# Patient Record
Sex: Female | Born: 1960
Health system: Southern US, Community
[De-identification: ages and names within clinical notes are randomized; demographics above are authoritative.]

## PROBLEM LIST (undated history)

## (undated) DIAGNOSIS — J039 Acute tonsillitis, unspecified: Secondary | ICD-10-CM

## (undated) DIAGNOSIS — I10 Essential (primary) hypertension: Secondary | ICD-10-CM

## (undated) DIAGNOSIS — J329 Chronic sinusitis, unspecified: Secondary | ICD-10-CM

## (undated) DIAGNOSIS — M79606 Pain in leg, unspecified: Secondary | ICD-10-CM

## (undated) DIAGNOSIS — J45909 Unspecified asthma, uncomplicated: Secondary | ICD-10-CM

## (undated) DIAGNOSIS — E041 Nontoxic single thyroid nodule: Secondary | ICD-10-CM

## (undated) HISTORY — PX: ENDOSCOPIC VEIN LASER TREATMENT: SHX1508

## (undated) HISTORY — PX: SHOULDER OPEN ROTATOR CUFF REPAIR: SHX2407

## (undated) HISTORY — DX: Pain in leg, unspecified: M79.606

## (undated) HISTORY — DX: Acute tonsillitis, unspecified: J03.90

## (undated) HISTORY — PX: ABDOMINAL HYSTERECTOMY: SHX81

## (undated) HISTORY — DX: Chronic sinusitis, unspecified: J32.9

## (undated) HISTORY — PX: TONSILLECTOMY: SUR1361

## (undated) HISTORY — DX: Essential (primary) hypertension: I10

---

## 1997-12-17 HISTORY — PX: ANAL FISSURECTOMY: SUR608

## 2000-11-23 ENCOUNTER — Emergency Department (HOSPITAL_COMMUNITY): Admission: EM | Admit: 2000-11-23 | Discharge: 2000-11-23 | Payer: Self-pay | Admitting: *Deleted

## 2006-11-25 ENCOUNTER — Observation Stay (HOSPITAL_COMMUNITY): Admission: EM | Admit: 2006-11-25 | Discharge: 2006-11-25 | Payer: Self-pay | Admitting: Emergency Medicine

## 2008-10-05 ENCOUNTER — Encounter: Admission: RE | Admit: 2008-10-05 | Discharge: 2008-10-05 | Payer: Self-pay | Admitting: Obstetrics and Gynecology

## 2009-01-08 ENCOUNTER — Emergency Department (HOSPITAL_COMMUNITY): Admission: EM | Admit: 2009-01-08 | Discharge: 2009-01-08 | Payer: Self-pay | Admitting: Emergency Medicine

## 2009-05-28 ENCOUNTER — Emergency Department (HOSPITAL_COMMUNITY): Admission: EM | Admit: 2009-05-28 | Discharge: 2009-05-28 | Payer: Self-pay | Admitting: Emergency Medicine

## 2009-12-22 ENCOUNTER — Encounter: Admission: RE | Admit: 2009-12-22 | Discharge: 2009-12-22 | Payer: Self-pay | Admitting: Family Medicine

## 2010-01-24 ENCOUNTER — Ambulatory Visit: Payer: Self-pay | Admitting: Diagnostic Radiology

## 2010-01-24 ENCOUNTER — Emergency Department (HOSPITAL_BASED_OUTPATIENT_CLINIC_OR_DEPARTMENT_OTHER): Admission: EM | Admit: 2010-01-24 | Discharge: 2010-01-24 | Payer: Self-pay | Admitting: Emergency Medicine

## 2011-03-08 LAB — COMPREHENSIVE METABOLIC PANEL
ALT: 29 U/L (ref 0–35)
AST: 29 U/L (ref 0–37)
Albumin: 4 g/dL (ref 3.5–5.2)
Alkaline Phosphatase: 62 U/L (ref 39–117)
BUN: 16 mg/dL (ref 6–23)
CO2: 32 mEq/L (ref 19–32)
Calcium: 8.8 mg/dL (ref 8.4–10.5)
Chloride: 102 mEq/L (ref 96–112)
Creatinine, Ser: 0.7 mg/dL (ref 0.4–1.2)
GFR calc Af Amer: >60 mL/min (ref >60)
GFR calc non Af Amer: >60 mL/min (ref >60)
Glucose, Bld: 99 mg/dL (ref 70–99)
Potassium: 3.5 mEq/L (ref 3.5–5.1)
Sodium: 141 mEq/L (ref 135–145)
Total Bilirubin: 0.9 mg/dL (ref 0.3–1.2)
Total Protein: 7.2 g/dL (ref 6.0–8.3)

## 2011-03-08 LAB — POCT CARDIAC MARKERS
CKMB, poc: 1.4 ng/mL (ref 1.0–8.0)
CKMB, poc: 2.6 ng/mL (ref 1.0–8.0)
Myoglobin, poc: 38.9 ng/mL (ref 12–200)
Myoglobin, poc: 43.4 ng/mL (ref 12–200)
Troponin i, poc: <0.05 ng/mL (ref 0.00–0.09)
Troponin i, poc: <0.05 ng/mL (ref 0.00–0.09)

## 2011-03-08 LAB — DIFFERENTIAL
Basophils Absolute: 0 10*3/uL (ref 0.0–0.1)
Basophils Relative: 0 % (ref 0–1)
Eosinophils Absolute: 0.4 10*3/uL (ref 0.0–0.7)
Eosinophils Relative: 4 % (ref 0–5)
Lymphocytes Relative: 25 % (ref 12–46)
Lymphs Abs: 2.5 10*3/uL (ref 0.7–4.0)
Monocytes Absolute: 0.6 10*3/uL (ref 0.1–1.0)
Monocytes Relative: 6 % (ref 3–12)
Neutro Abs: 6.2 10*3/uL (ref 1.7–7.7)
Neutrophils Relative %: 64 % (ref 43–77)

## 2011-03-08 LAB — D-DIMER, QUANTITATIVE: D-Dimer, Quant: 0.88 ug/mL-FEU — ABNORMAL HIGH (ref 0.00–0.48)

## 2011-03-08 LAB — CBC
HCT: 39.6 % (ref 36.0–46.0)
Hemoglobin: 14 g/dL (ref 12.0–15.0)
MCHC: 35.3 g/dL (ref 30.0–36.0)
MCV: 93.2 fL (ref 78.0–100.0)
Platelets: 245 10*3/uL (ref 150–400)
RBC: 4.25 MIL/uL (ref 3.87–5.11)
RDW: 11.5 % (ref 11.5–15.5)
WBC: 9.7 10*3/uL (ref 4.0–10.5)

## 2011-03-08 LAB — LIPASE, BLOOD: Lipase: 54 U/L (ref 23–300)

## 2011-05-04 NOTE — Consult Note (Signed)
[facility]  Patient:    Samantha Roberson, Samantha Roberson                         MRN: [id number] Proc. Date: 11/23/00 Adm. Date:  [id number] Attending:  Sandi Raveling                          Consultation Report  HISTORY OF PRESENT ILLNESS:  The patient is a 50 year old white married female who is status post total vaginal hysterectomy on November 06, 2000, done in [place], [place].  She travelled to  [place] to visit her husband who has moved here for his job recently, then began having extensive vaginal bleeding at approximately 10:00 this morning and was transported to the [facility] Emergency Room by EMS.  The patient denies any vaginal entry but admits to having had sexual contact resulting in orgasm.  She has had no other complications from her total vaginal hysterectomy.  Her other gynecologic surgical procedures include an ovarian cystectomy done laparoscopically in 1999 and then in 2000 a minilaparotomy with oophorectomy.  From her history, her total vaginal hysterectomy went uneventfully, and she did not require vaginal packing immediately postoperatively.  She was discharged home on postoperative day one with anti-inflammatory drugs and Percocet.  She discontinued her Percocet approximately two days after getting home from the hospital and has had no further abdominal pain.  She specifically denies any dysuria, though she noticed the heavy bleeding initially when she went to urinate.  She denies any nausea, vomiting, or dizziness.  She is able to ambulate without difficulty.  PHYSICAL EXAMINATION:  VITAL SIGNS:  Blood pressure 112/51, pulse 78.  ABDOMEN:  Soft without masses, organomegaly, or tenderness.  PELVIC:  EG/BUS is within normal limits.  There is a small amount of bloody drainage on a perineal pad which has been in place for two hours.  Vaginal examination reveals a small amount of dark blood in the vault.  The vaginal cuff is  visualized after removal of that small amount of dark blood.  The sutures are noted to be intact.  The apex is well visualized with no active bleeding noted.  On bimanual examination, no masses or tenderness are appreciated.  Cervix and uterus are surgically absent.  LABORATORY STUDIES:  Hemoglobin 12.3, white blood cell count 9.6.  IMPRESSION:  Light postoperative hemorrhage status post total vaginal hysterectomy, now resolved.  DISPOSITION: 1. The patient is discharged to home in the care of her husband who lives here    in [place]. 2. She is given my on-call number to notify me if she has any bleeding heavier    than a light period.  If she has no further bleeding, she can return to    [place], as she has planned on November 25, 2000.  If she has any    further bleeding than is heavier than a light period, she will call me and    be reevaluated at that time.  The patient seems to understand and is    comfortable with these instructions. DD:  11/23/00 TD:  11/24/00 Job: [id number]

## 2011-05-04 NOTE — H&P (Signed)
NAME:  ZOIEE, WIMMER NO.:  [id number]   MEDICAL RECORD NO.:  [id number]          PATIENT TYPE:  EMS   LOCATION:  [facility]                         FACILITY:  [facility]   PHYSICIAN:  Jackie Plum, M.D.DATE OF BIRTH:  March 04, 1961   DATE OF ADMISSION:  11/25/2006  DATE OF DISCHARGE:                              HISTORY & PHYSICAL   CHIEF COMPLAINT:  Chest pain.   HISTORY OF PRESENT ILLNESS:  The patient is a 50 year old Caucasian lady  who presented with 1-day history of chest pain which was said to be a  sternal pressure-like pain radiating to both arms and jaw.  It had been  of mild to moderate intensity, but came down to mild intensity at time  of evaluation in the ED.  The pain is worsened by deep breathing.  She  has had some nausea with dizziness, especially on sitting up.  There has  not been any diaphoresis, syncopal episode or presyncope.  The patient  denies any PND, orthopnea, palpitations, fever or chills, cough or  sputum production.  No abdominal pain.  No constipation and no diarrhea.   PAST MEDICAL HISTORY:  Positive for history of anal fissure, status post  repair, status post partial hysterectomy and removal of ovariectomy due  to ovarian cyst.  She denies any previous history of hypertension,  diabetes or dyslipidemia.   FAMILY HISTORY:  Positive for heart disease and hypertension.   SOCIAL HISTORY:  The patient is married and lives with her husband, who  lives in [place].  She recently moved from [place] to be with her  husband about 3 months ago.  She works at __________ and does not smoke  cigarettes.  She drinks alcohol occasionally on a social basis.   MEDICATIONS:  She is not on any medications at the moment.   ALLERGIES:  Does not have any medication allergies.   REVIEW OF SYSTEMS:  As noted above, otherwise unremarkable.   PHYSICAL EXAMINATION:  VITAL SIGNS:  Blood pressure 117/72, pulse 72,  respirations 20, temperature 97.8  degrees Fahrenheit, O2 SAT 99%.  GENERAL:  She is not in any cardiopulmonary distress and is comfortable-  looking.  HEENT:  Normocephalic, atraumatic.  Pupils equal, round and reactive to  light.  Extraocular movements intact.  Oropharynx moist.  NECK:  Supple.  No JVD.  LUNGS:  Clear to auscultation.  CARDIAC:  Regular rate and rhythm.  No gallops or murmur.  ABDOMEN:  Soft and nontender.  Bowel sounds present.  EXTREMITIES:  No cyanosis.  CNS:  Exam was nonfocal.   IMAGING STUDY:  CT scan of the chest revealed no PE.  There was possible  thickening of the distal esophageal wall versus a collapsed hiatal  hernia.   X-ray of the chest showed medial right lower lobe infiltrate.   LABORATORY WORK:  Basic metabolic panel was within normal limits, except  for mild hypokalemia of 3.3.  CBC was within normal limits, except for  WBC of 11,100.  Point-of-care cardiac enzymes were within normal limits.   IMPRESSION:  Right lower lobe pneumonia.   The patient will  be admitted to a telemetry bed for observation status.  We will check serial cardiac enzymes.  The patient will benefit from  esophagogram for further evaluation and management of her abnormal CT  scan findings.  We will check serial cardiac enzymes and lipid panel.  Further recommendations will be made as the data base responds.  Hopefully, she can be discharged sometime tomorrow after ruling out.      Jackie Plum, M.D.  Electronically Signed     GO/MEDQ  D:  11/25/2006  T:  11/25/2006  Job:  [id number]

## 2012-02-21 ENCOUNTER — Other Ambulatory Visit: Payer: Self-pay

## 2012-02-21 DIAGNOSIS — I83893 Varicose veins of bilateral lower extremities with other complications: Secondary | ICD-10-CM

## 2012-02-21 DIAGNOSIS — M79609 Pain in unspecified limb: Secondary | ICD-10-CM

## 2012-02-28 ENCOUNTER — Other Ambulatory Visit: Payer: Self-pay | Admitting: Family Medicine

## 2012-02-28 DIAGNOSIS — Z1231 Encounter for screening mammogram for malignant neoplasm of breast: Secondary | ICD-10-CM

## 2012-03-13 ENCOUNTER — Ambulatory Visit
Admission: RE | Admit: 2012-03-13 | Discharge: 2012-03-13 | Disposition: A | Discharge status: Home / Self Care | Payer: 59 | Source: Ambulatory Visit | Attending: Family Medicine | Admitting: Family Medicine

## 2012-03-13 DIAGNOSIS — Z1231 Encounter for screening mammogram for malignant neoplasm of breast: Secondary | ICD-10-CM

## 2012-03-24 ENCOUNTER — Encounter (INDEPENDENT_AMBULATORY_CARE_PROVIDER_SITE_OTHER): Payer: 59 | Admitting: Obstetrics and Gynecology

## 2012-03-24 DIAGNOSIS — Z01419 Encounter for gynecological examination (general) (routine) without abnormal findings: Secondary | ICD-10-CM

## 2012-03-26 ENCOUNTER — Encounter: Payer: Self-pay | Admitting: Vascular Surgery

## 2012-04-10 ENCOUNTER — Encounter: Payer: Self-pay | Admitting: Vascular Surgery

## 2012-04-11 ENCOUNTER — Ambulatory Visit (INDEPENDENT_AMBULATORY_CARE_PROVIDER_SITE_OTHER): Payer: 59 | Admitting: Vascular Surgery

## 2012-04-11 ENCOUNTER — Encounter: Payer: Self-pay | Admitting: Vascular Surgery

## 2012-04-11 VITALS — BP 131/87 | HR 75 | Temp 98.1°F | Ht 69.0 in | Wt 200.0 lb

## 2012-04-11 DIAGNOSIS — I83893 Varicose veins of bilateral lower extremities with other complications: Secondary | ICD-10-CM | POA: Insufficient documentation

## 2012-04-11 DIAGNOSIS — M79609 Pain in unspecified limb: Secondary | ICD-10-CM

## 2012-04-11 NOTE — Progress Notes (Signed)
BLE venous reflux duplex performed @ [facility] 04/11/2012

## 2012-04-11 NOTE — Progress Notes (Signed)
[facility] [place]  Referred by:  Garth Schlatter, MD [facility] [address] [place], [place] [address]  Reason for referral: B swollen legs  History of Present Illness  Samantha Roberson is a 51 y.o. (1961-02-15) female who presents with chief complaint: swollen leg.  Patient notes, onset of swelling over one year ago, associated with standing.  The patient has had no history of DVT, known history of varicose vein, no history of venous stasis ulcers, no history of  Lymphedema and no history of skin changes in lower legs.  There is known family history of venous disorders.  The patient has used OTC compression stockings in the past.  She notes bilateral swelling (L>R) and pain and achiing in both lower legs by the end of the day.  The swelling resolves with extended reversed incline position.  Past Medical History  Diagnosis Date  . Leg pain   . Asthma   . Hypertension   . Sinusitis   . Tonsillitis     Past Surgical History  Procedure Date  . Abdominal hysterectomy   . Anal fissurectomy 1999    History   Social History  . Marital Status: Married    Spouse Name: N/A    Number of Children: N/A  . Years of Education: N/A   Occupational History  . Not on file.   Social History Main Topics  . Smoking status: Never Smoker   . Smokeless tobacco: Not on file  . Alcohol Use: Yes  . Drug Use: No  . Sexually Active:    Other Topics Concern  . Not on file   Social History Narrative  . No narrative on file    Family History  Problem Relation Age of Onset  . Stroke Mother   . Hypertension Mother   . Cancer Father   . Hypertension Father   . Hyperlipidemia Father   . Hypertension Sister     Current Outpatient Prescriptions on File Prior to Visit  Medication Sig Dispense Refill  . albuterol (PROAIR HFA) 108 (90 BASE) MCG/ACT inhaler Inhale 2 puffs into the lungs every 6 (six) hours as needed.      Marland Kitchen aspirin 81 MG  tablet Take 81 mg by mouth daily.      . hydrochlorothiazide (HYDRODIURIL) 25 MG tablet Take 25 mg by mouth daily. Take two (25 mg) tablets daily.      . metoprolol succinate (TOPROL-XL) 25 MG 24 hr tablet Take 25 mg by mouth daily.      . Multiple Vitamin (MULTIVITAMIN) capsule Take 1 capsule by mouth daily.        Allergies  Allergen Reactions  . Benadryl (Altaryl)     Review of Systems (Positive items checked otherwise negative)  General: [ ]  Weight loss, [ ]  Weight gain, [ ]   Loss of appetite, [ ]  Fever  Neurologic: [ ]  Dizziness, [ ]  Blackouts, [ ]  Headaches, [ ]  Seizure, [x]  weakness in arms and legs  Ear/Nose/Throat: [ ]  Change in eyesight, [ ]  Change in hearing, [ ]  Nose bleeds, [ ]  Sore throat  Vascular: [x]  Pain in legs with walking, [x]  Pain in feet while lying flat, [ ]  Non-healing ulcer, Stroke, [ ]  "Mini stroke", [ ]  Slurred speech, [ ]  Temporary blindness, [ ]  Blood clot in vein, [ ]  Phlebitis, [x]  leg swelling  Pulmonary: [ ]  Home oxygen, [ ]  Productive cough, [ ]  Bronchitis, [ ]  Coughing up blood, [ ]  Asthma, [ ]   Wheezing  Musculoskeletal: [ ]  Arthritis, [ ]  Joint pain, [ ]  Muscle pain  Cardiac: [ ]  Chest pain, [ ]  Chest tightness/pressure, [ ]  Shortness of breath when lying flat, [ ]  Shortness of breath with exertion, [ ]  Palpitations, [ ]  Heart murmur, [ ]  Arrythmia, [ ]  Atrial fibrillation  Hematologic: [ ]  Bleeding problems, [ ]  Clotting disorder, [ ]  Anemia  Psychiatric:  [ ]  Depression, [ ]  Anxiety, [ ]  Attention deficit disorder  Gastrointestinal:  [ ]  Black stool,[ ]   Blood in stool, [ ]  Peptic ulcer disease, [ ]  Reflux, [ ]  Hiatal hernia, [ ]  Trouble swallowing, [ ]  Diarrhea, [ ]  Constipation  Urinary:  [ ]  Kidney disease, [ ]  Burning with urination, [ ]  Frequent urination, [ ]  Difficulty urinating  Skin: [ ]  Ulcers, [ ]  Rashes  Physical Examination  Filed Vitals:   04/11/12 1438  BP: 131/87  Pulse: 75  Temp: 98.1 F (36.7 C)  TempSrc: Oral    Height: 5\' 9"  (1.753 m)  Weight: 200 lb (90.719 kg)   Body mass index is 29.53 kg/(m^2).  General: A&O x 3, WDWN, tall  Head: [place]/AT  Ear/Nose/Throat: Hearing grossly intact, nares w/o erythema or drainage, oropharynx w/o Erythema/Exudate  Eyes: PERRLA, EOMI  Neck: Supple, no nuchal rigidity, no palpable LAD  Pulmonary: Sym exp, good air movt, CTAB, no rales, rhonchi, & wheezing  Cardiac: RRR, Nl S1, S2, no Murmurs, rubs or gallops  Vascular: Vessel Right Left  Radial Palpable Palpable  Brachial Palpable Palpable  Carotid Palpable, without bruit Palpable, without bruit  Aorta Non-palpable N/A  Femoral Palpable Palpable  Popliteal Non-palpable Non-palpable  PT Palpable Palpable  DP Palpable Palpable   Gastrointestinal: soft, NTND, -G/R, - HSM, - masses, - CVAT B  Musculoskeletal: M/S 5/5 throughout , Extremities without ischemic changes , B lower leg varicosities (L>R), R medial varicosities enlarged  Neurologic: CN 2-12 intact , light touch intact in extremities , Motor exam as listed above  Psychiatric: Judgment intact, Mood & affect appropriate for pt's clinical situation  Dermatologic: See M/S exam for extremity exam, no rashes otherwise noted  Lymph : No Cervical, Axillary, or Inguinal lymphadenopathy   Non-Invasive Vascular Imaging  BLE Venous Insufficiency Duplex (Date: 04/11/12):   RLE: GSV reflux, no deep reflux  LLE: GSV reflux, CFV reflux, perforator reflux  Outside Studies/Documentation 3 pages of outside documents were reviewed including: outpatient clinic chart.  Medical Decision Making  Samantha Roberson is a 51 y.o. female who presents with: sx chronic venous insufficiency (C3).   Based on the patient's history and examination, I recommend: BLE thigh high 20-30 mm Hg compression stockings.  Re-evaluation in Vein Clinic in 3 months for possible EVLA of L GSV then possible R GSV at a later date.  Thank you for allowing [place] to participate in  this patient's care.  Leonides Sake, MD [facility] [place] Office: [phone] Pager: [phone]  04/11/2012, 3:08 PM

## 2012-04-16 NOTE — Procedures (Unsigned)
LOWER EXTREMITY VENOUS REFLUX EXAM  INDICATION:  Lower extremity varicose veins and pain  EXAM:  Using color-flow imaging and pulse Doppler spectral analysis, the bilateral common femoral, femoral, popliteal, posterior tibial, great and small saphenous veins were evaluated.  There is evidence suggesting deep venous insufficiency in the left lower extremity.  There is no evidence suggesting deep venous insufficiency in the right lower extremity.  The bilateral saphenofemoral junctions are not competent with reflux of >500 milliseconds.  The bilateral GSV's are not competent with reflux of >500 milliseconds with the calibers as described below.  The bilateral proximal small saphenous veins demonstrate competency.  GSV Diameter (used if found to be incompetent only)                                           Right    Left Proximal Greater Saphenous Vein           0.60 cm  0.73 cm Proximal-to-mid-thigh                     0.64 cm  0.44 cm Mid thigh                                 0.53 cm  0.31 cm Mid-distal thigh                          cm       cm Distal thigh                              0.49 cm  0.39 cm Knee                                      0.42 cm  0.40 cm  IMPRESSION: 1. The bilateral great saphenous veins are not competent with reflux     of >500 milliseconds. 2. The bilateral great saphenous veins are not tortuous. 3. The deep venous system of the right lower extremity is competent. 4. The deep venous system of the left lower extremity is not competent     with reflux of >500 milliseconds. 5. The bilateral small saphenous veins are competent. 6. There is an incompetent perforator involving the left mid distal     calf measuring 0.23 cm in diameter as noted on drawing.      ___________________________________________ Fransisco Hertz, MD  SH/MEDQ  D:  04/11/2012  T:  04/11/2012  Job:  [id number]

## 2012-07-14 ENCOUNTER — Ambulatory Visit: Payer: 59 | Admitting: Vascular Surgery

## 2012-09-08 ENCOUNTER — Encounter: Payer: Self-pay | Admitting: Vascular Surgery

## 2012-09-09 ENCOUNTER — Ambulatory Visit (INDEPENDENT_AMBULATORY_CARE_PROVIDER_SITE_OTHER): Payer: 59 | Admitting: Vascular Surgery

## 2012-09-09 ENCOUNTER — Encounter: Payer: Self-pay | Admitting: Vascular Surgery

## 2012-09-09 VITALS — BP 126/83 | HR 91 | Resp 18 | Ht 69.0 in | Wt 210.0 lb

## 2012-09-09 DIAGNOSIS — I83893 Varicose veins of bilateral lower extremities with other complications: Secondary | ICD-10-CM

## 2012-09-09 NOTE — Progress Notes (Signed)
Subjective:     Patient ID: Samantha Roberson, female   DOB: January 14, 1961, 51 y.o.   MRN: [id number]  HPI this 51 year old female returns for further evaluation of her severe venous insufficiency of both legs. She was seen by Dr. Imogene Burn in April of this year. She has been having swelling which has progressed over the last few years to a severe degree on the left and slightly lesser degree on the right. She has tried long-leg elastic compression stockings 20-30 mm gradient as well as elevation and ibuprofen with very little improvement. She has no history of stasis ulcers or bleeding or DVT or superficial thrombophlebitis. Her symptoms are affecting her daily living. She has documented gross reflux in both great saphenous veins on a formal duplex exam performed in April of this year.  Past Medical History  Diagnosis Date  . Leg pain   . Asthma   . Hypertension   . Sinusitis   . Tonsillitis     History  Substance Use Topics  . Smoking status: Never Smoker   . Smokeless tobacco: Never Used  . Alcohol Use: Yes    Family History  Problem Relation Age of Onset  . Stroke Mother   . Hypertension Mother   . Cancer Father   . Hypertension Father   . Hyperlipidemia Father   . Hypertension Sister     Allergies  Allergen Reactions  . Benadryl (Diphenhydramine Hcl)     Current outpatient prescriptions:albuterol (PROAIR HFA) 108 (90 BASE) MCG/ACT inhaler, Inhale 2 puffs into the lungs every 6 (six) hours as needed., Disp: , Rfl: ;  amoxicillin-clavulanate (AUGMENTIN) 875-125 MG per tablet, , Disp: , Rfl: ;  aspirin 81 MG tablet, Take 81 mg by mouth daily., Disp: , Rfl: ;  hydrochlorothiazide (HYDRODIURIL) 25 MG tablet, Take 25 mg by mouth daily. Take two (25 mg) tablets daily., Disp: , Rfl:  metoprolol succinate (TOPROL-XL) 25 MG 24 hr tablet, Take 25 mg by mouth daily., Disp: , Rfl: ;  Multiple Vitamin (MULTIVITAMIN) capsule, Take 1 capsule by mouth daily., Disp: , Rfl:   BP 126/83  Pulse 91   Resp 18  Ht 5\' 9"  (1.753 m)  Wt 210 lb (95.255 kg)  BMI 31.01 kg/m2  Body mass index is 31.01 kg/(m^2).        Review of Systems denies chest pain, dyspnea on exertion, PND, orthopnea, hemoptysis, claudication    Objective:   Physical Exam blood pressure 126/83 heart rate 91 respirations 18 General well-developed well-nourished female in no apparent stress alert and oriented x3 Lower extremities left leg with 3+ femoral and dorsalis pedis pulse palpable. There is 2+ distal edema from the knee to the foot. No bulging varicosities are noted. No active ulcerations are noted. Right lower extremity has 3+ femoral and dorsalis pedis pulse palpable. 1+ edema present distally with early hyperpigmentation.    Assessment:     Severe venous insufficiency bilaterally with chronic worsening edema and severe symptoms refractory to conservative measures such as long-leg elastic compression stockings, ibuprofen, elevation.  This is due to gross reflux bilateral great saphenous systems.    Plan:     Patient needs a laser ablation left great saphenous vein followed by laser ablation right great saphenous vein. This is to hopefully improve her symptomatology of severe edema and pain. Will proceed with precertification to perform this in near future

## 2012-09-30 ENCOUNTER — Other Ambulatory Visit: Payer: Self-pay | Admitting: *Deleted

## 2012-09-30 DIAGNOSIS — I83893 Varicose veins of bilateral lower extremities with other complications: Secondary | ICD-10-CM

## 2012-10-24 ENCOUNTER — Encounter: Payer: Self-pay | Admitting: Vascular Surgery

## 2012-10-27 ENCOUNTER — Encounter: Payer: Self-pay | Admitting: Vascular Surgery

## 2012-10-27 ENCOUNTER — Ambulatory Visit (INDEPENDENT_AMBULATORY_CARE_PROVIDER_SITE_OTHER): Payer: 59 | Admitting: Vascular Surgery

## 2012-10-27 VITALS — BP 120/90 | HR 92 | Resp 16 | Ht 69.0 in | Wt 210.0 lb

## 2012-10-27 DIAGNOSIS — I83893 Varicose veins of bilateral lower extremities with other complications: Secondary | ICD-10-CM

## 2012-10-27 NOTE — Progress Notes (Signed)
Laser Ablation Procedure      Date: 10/27/2012    Samantha Roberson DOB:1961-03-12  Consent signed: Yes  Surgeon:J.D. Hart [place]  Procedure: Laser Ablation: right Greater Saphenous Vein  BP 120/90  Pulse 92  Resp 16  Ht 5\' 9"  (1.753 m)  Wt 210 lb (95.255 kg)  BMI 31.01 kg/m2  Start time: 3:00   End time: 3:30  Tumescent Anesthesia: 325 cc 0.9% NaCl with 50 cc Lidocaine HCL with 1% Epi and 15 cc 8.4% NaHCO3  Local Anesthesia: 9 cc Lidocaine HCL and NaHCO3 (ratio 2:1)  Pulsed mode: Watts 15 Seconds 1 Pulses:1 Total Pulses:121 Total Energy: 1815 Total Time: 2:01       Patient tolerated procedure well: Yes  Notes:   Description of Procedure:  After marking the course of the saphenous vein and the secondary varicosities in the standing position, the patient was placed on the operating table in the supine position, and the right leg was prepped and draped in sterile fashion. Local anesthetic was administered, and under ultrasound guidance the saphenous vein was accessed with a micro needle and guide wire; then the micro puncture sheath was placed. A guide wire was inserted to the saphenofemoral junction, followed by a 5 french sheath.  The position of the sheath and then the laser fiber below the junction was confirmed using the ultrasound and visualization of the aiming beam.  Tumescent anesthesia was administered along the course of the saphenous vein using ultrasound guidance. Protective laser glasses were placed on the patient, and the laser was fired at 15 watt pulsed mode advancing 1-2 mm per sec.  For a total of 1815 joules.  A steri strip was applied to the puncture site.    ABD pads and thigh high compression stockings were applied.  Ace wrap bandages were applied over the phlebectomy sites and at the top of the saphenofemoral junction.  Blood loss was less than 15 cc.  The patient ambulated out of the operating room having tolerated the procedure well.

## 2012-10-27 NOTE — Progress Notes (Signed)
Subjective:     Patient ID: Samantha Roberson, female   DOB: [birth year], 51 y.o.   MRN: [id number]  HPI this 51 year old female had laser ablation of the right great saphenous vein performed under local tumescent anesthesia for venous hypertension. She had a total of 1815 J of energy utilized. She tolerated the procedure well.  Review of Systems     Objective:   Physical ExamBP 120/90  Pulse 92  Resp 16  Ht 5\' 9"  (1.753 m)  Wt 210 lb (95.255 kg)  BMI 31.01 kg/m2      Assessment:    well-tolerated laser ablation right great saphenous vein performed for venous hypertension due to gross reflux right great saphenous system    Plan:     Return in one week for venous duplex exam to confirm closure right great saphenous vein. Will then be scheduled for similar procedure on the contralateral left leg

## 2012-10-28 ENCOUNTER — Telehealth: Payer: Self-pay | Admitting: *Deleted

## 2012-10-28 ENCOUNTER — Encounter: Payer: Self-pay | Admitting: Vascular Surgery

## 2012-10-28 NOTE — Telephone Encounter (Signed)
Left a voicemail asking her to call me.

## 2012-10-28 NOTE — Telephone Encounter (Signed)
Patient returned my call. Worked last night so feeling a bit sore today. In good spirits about it and following all instructions. Will see her next week at her fu appts.

## 2012-10-31 ENCOUNTER — Encounter: Payer: Self-pay | Admitting: Vascular Surgery

## 2012-11-03 ENCOUNTER — Ambulatory Visit (INDEPENDENT_AMBULATORY_CARE_PROVIDER_SITE_OTHER): Payer: 59 | Admitting: Vascular Surgery

## 2012-11-03 ENCOUNTER — Encounter: Payer: Self-pay | Admitting: Vascular Surgery

## 2012-11-03 VITALS — BP 131/91 | HR 96 | Resp 16 | Ht 69.0 in | Wt 219.0 lb

## 2012-11-03 DIAGNOSIS — I83893 Varicose veins of bilateral lower extremities with other complications: Secondary | ICD-10-CM

## 2012-11-03 DIAGNOSIS — M7989 Other specified soft tissue disorders: Secondary | ICD-10-CM

## 2012-11-03 DIAGNOSIS — M79609 Pain in unspecified limb: Secondary | ICD-10-CM

## 2012-11-03 NOTE — Progress Notes (Signed)
Right lower extremity venous duplex post ablation performed @ [facility] 11/03/2012

## 2012-11-03 NOTE — Progress Notes (Signed)
Subjective:     Patient ID: Samantha Roberson, female   DOB: 1961/08/22, 51 y.o.   MRN: [id number]  HPI this 51 year old female is one week post laser ablation right great saphenous vein performed for venous hypertension with swelling and pain. This was performed under local tumescent anesthesia last week. She returns today complaining of only mild discomfort in the thigh over the great saphenous vein. She states the edema in the ankle and foot have already diminished. She is wearing her stocking and taking ibuprofen as instructed.  Past Medical History  Diagnosis Date  . Leg pain   . Asthma   . Hypertension   . Sinusitis   . Tonsillitis     History  Substance Use Topics  . Smoking status: Never Smoker   . Smokeless tobacco: Never Used  . Alcohol Use: Yes    Family History  Problem Relation Age of Onset  . Stroke Mother   . Hypertension Mother   . Cancer Father   . Hypertension Father   . Hyperlipidemia Father   . Hypertension Sister     Allergies  Allergen Reactions  . Benadryl (Diphenhydramine Hcl)     Current outpatient prescriptions:albuterol (PROAIR HFA) 108 (90 BASE) MCG/ACT inhaler, Inhale 2 puffs into the lungs every 6 (six) hours as needed., Disp: , Rfl: ;  amoxicillin-clavulanate (AUGMENTIN) 875-125 MG per tablet, , Disp: , Rfl: ;  aspirin 81 MG tablet, Take 81 mg by mouth daily., Disp: , Rfl: ;  hydrochlorothiazide (HYDRODIURIL) 25 MG tablet, Take 25 mg by mouth daily. Take two (25 mg) tablets daily., Disp: , Rfl:  metoprolol succinate (TOPROL-XL) 25 MG 24 hr tablet, Take 25 mg by mouth daily., Disp: , Rfl: ;  Multiple Vitamin (MULTIVITAMIN) capsule, Take 1 capsule by mouth daily., Disp: , Rfl:   BP 131/91  Pulse 96  Resp 16  Ht 5\' 9"  (1.753 m)  Wt 219 lb (99.338 kg)  BMI 32.34 kg/m2  Body mass index is 32.34 kg/(m^2).          Review of Systems denies chest pain, dyspnea on exertion, PND, orthopnea, hemoptysis, lateralizing weakness, aphasia,  amaurosis fugax.    Objective:   Physical Exam blood pressure 131/91 heart rate 96 respirations 16 General well-developed well-nourished female in no apparent stress alert and oriented x3 Lungs no rhonchi or wheezing Lower extremity-right leg with mild discomfort along Corser great saphenous vein from distal thigh to saphenofemoral junction with mild ecchymosis. 3+ dorsalis pedis and posterior tibial pulses palpable. No distal edema noted.  Today I ordered a venous duplex exam of the right leg which are reviewed and interpreted. There is no DVT. There is total closure of the right great saphenous vein from the distal thigh to near the saphenofemoral junction.     Assessment:     Successful laser ablation right great saphenous vein performed for venous hypertension under local tumescent anesthesia one week ago    Plan:     Will proceed with similar procedure contralateral left leg and near future

## 2012-11-12 ENCOUNTER — Encounter: Payer: Self-pay | Admitting: Vascular Surgery

## 2012-11-17 ENCOUNTER — Ambulatory Visit (INDEPENDENT_AMBULATORY_CARE_PROVIDER_SITE_OTHER): Payer: 59 | Admitting: Vascular Surgery

## 2012-11-17 ENCOUNTER — Encounter: Payer: Self-pay | Admitting: Vascular Surgery

## 2012-11-17 VITALS — BP 179/140 | HR 78 | Resp 16 | Ht 69.0 in | Wt 220.0 lb

## 2012-11-17 DIAGNOSIS — I83893 Varicose veins of bilateral lower extremities with other complications: Secondary | ICD-10-CM

## 2012-11-17 NOTE — Progress Notes (Signed)
Laser Ablation Procedure      Date: 11/17/2012    Samantha Roberson DOB:03-30-1961  Consent signed: Yes  Surgeon:J.D. Hart [place]  Procedure: Laser Ablation: left Greater Saphenous Vein  BP 179/140  Pulse 78  Resp 16  Ht 5\' 9"  (1.753 m)  Wt 220 lb (99.791 kg)  BMI 32.49 kg/m2  Start time: 3:00   End time: 3:40  Tumescent Anesthesia: 400 cc 0.9% NaCl with 50 cc Lidocaine HCL with 1% Epi and 15 cc 8.4% NaHCO3  Local Anesthesia: 6 cc Lidocaine HCL and NaHCO3 (ratio 2:1)  Pulsed mode: Watts 15 Seconds 1 Pulses:1 Total Pulses:117 Total Energy: 1744 Total Time: 1:56     Patient tolerated procedure well: Yes  Notes:   Description of Procedure:  After marking the course of the saphenous vein and the secondary varicosities in the standing position, the patient was placed on the operating table in the supine position, and the left leg was prepped and draped in sterile fashion. Local anesthetic was administered, and under ultrasound guidance the saphenous vein was accessed with a micro needle and guide wire; then the micro puncture sheath was placed. A guide wire was inserted to the saphenofemoral junction, followed by a 5 french sheath.  The position of the sheath and then the laser fiber below the junction was confirmed using the ultrasound and visualization of the aiming beam.  Tumescent anesthesia was administered along the course of the saphenous vein using ultrasound guidance. Protective laser glasses were placed on the patient, and the laser was fired at 15 watt pulsed mode advancing 1-2 mm per sec.  For a total of 1744 joules.  A steri strip was applied to the puncture site.    ABD pads and thigh high compression stockings were applied.  Ace wrap bandages were applied over the phlebectomy sites and at the top of the saphenofemoral junction.  Blood loss was less than 15 cc.  The patient ambulated out of the operating room having tolerated the procedure well.

## 2012-11-17 NOTE — Progress Notes (Signed)
Subjective:     Patient ID: Samantha Roberson, female   DOB: Jan 23, 1961, 51 y.o.   MRN: [id number]  HPI this 51 year old female had laser ablation of the right great saphenous vein performed under local tumescent anesthesia from the knee to the saphenofemoral junction. She tolerated the procedure well. Total of 1744 J of energy was utilized.   Review of Systems     Objective:   Physical ExamBP 179/140  Pulse 78  Resp 16  Ht 5\' 9"  (1.753 m)  Wt 220 lb (99.791 kg)  BMI 32.49 kg/m2      Assessment:    well-tolerated laser ablation right great saphenous vein performed under   local tumescent anesthesia Plan:     Return in one week for venous duplex exam to confirm closure right great saphenous vein

## 2012-11-18 ENCOUNTER — Telehealth: Payer: Self-pay | Admitting: *Deleted

## 2012-11-18 NOTE — Telephone Encounter (Signed)
Pt having more discomfort with this leg than the other one but is fine. Following all instructions. Reminded her of her fu appt next week.

## 2012-11-19 ENCOUNTER — Other Ambulatory Visit: Payer: Self-pay | Admitting: *Deleted

## 2012-11-19 DIAGNOSIS — I83893 Varicose veins of bilateral lower extremities with other complications: Secondary | ICD-10-CM

## 2012-11-24 ENCOUNTER — Encounter: Payer: Self-pay | Admitting: Vascular Surgery

## 2012-11-25 ENCOUNTER — Encounter (INDEPENDENT_AMBULATORY_CARE_PROVIDER_SITE_OTHER): Payer: 59 | Admitting: *Deleted

## 2012-11-25 ENCOUNTER — Encounter: Payer: Self-pay | Admitting: Vascular Surgery

## 2012-11-25 ENCOUNTER — Ambulatory Visit (INDEPENDENT_AMBULATORY_CARE_PROVIDER_SITE_OTHER): Payer: 59 | Admitting: Vascular Surgery

## 2012-11-25 VITALS — BP 143/82 | HR 89 | Resp 18 | Ht 69.0 in | Wt 219.0 lb

## 2012-11-25 DIAGNOSIS — I83893 Varicose veins of bilateral lower extremities with other complications: Secondary | ICD-10-CM

## 2012-11-25 DIAGNOSIS — Z48812 Encounter for surgical aftercare following surgery on the circulatory system: Secondary | ICD-10-CM

## 2012-11-25 NOTE — Progress Notes (Signed)
Subjective:     Patient ID: Samantha Roberson, female   DOB: [birth year], 51 y.o.   MRN: [id number]  HPI this 51 year old female returns 1 week post laser ablation left great saphenous vein performed for venous hypertension with pain and swelling. She has had mild to moderate discomfort along the course of the great saphenous vein with the ablation was performed. She has had no distal edema in the left leg. Her right leg continues to feel well now 3 weeks post laser ablation. She is wearing her long light elastic compression stocking on the left side and taking ibuprofen as instructed.  Past Medical History  Diagnosis Date  . Leg pain   . Asthma   . Hypertension   . Sinusitis   . Tonsillitis     History  Substance Use Topics  . Smoking status: Never Smoker   . Smokeless tobacco: Never Used  . Alcohol Use: Yes    Family History  Problem Relation Age of Onset  . Stroke Mother   . Hypertension Mother   . Cancer Father   . Hypertension Father   . Hyperlipidemia Father   . Hypertension Sister     Allergies  Allergen Reactions  . Benadryl (Diphenhydramine Hcl)     Current outpatient prescriptions:albuterol (PROAIR HFA) 108 (90 BASE) MCG/ACT inhaler, Inhale 2 puffs into the lungs every 6 (six) hours as needed., Disp: , Rfl: ;  amoxicillin-clavulanate (AUGMENTIN) 875-125 MG per tablet, , Disp: , Rfl: ;  aspirin 81 MG tablet, Take 81 mg by mouth daily., Disp: , Rfl: ;  hydrochlorothiazide (HYDRODIURIL) 25 MG tablet, Take 25 mg by mouth daily. Take two (25 mg) tablets daily., Disp: , Rfl:  metoprolol succinate (TOPROL-XL) 25 MG 24 hr tablet, Take 25 mg by mouth daily., Disp: , Rfl: ;  Multiple Vitamin (MULTIVITAMIN) capsule, Take 1 capsule by mouth daily., Disp: , Rfl:   BP 143/82  Pulse 89  Resp 18  Ht 5\' 9"  (1.753 m)  Wt 219 lb (99.338 kg)  BMI 32.34 kg/m2  Body mass index is 32.34 kg/(m^2).          Review of Systems denies chest pain, dyspnea on exertion, PND,  orthopnea, hemoptysis, claudication    Objective:   Physical Exam blood pressure 143 rate 82 heart rate 80 and respirations 18 General well-developed well-nourished female in no apparent stress alert and oriented x3 Lungs no rhonchi or wheezing Cardiovascular regular rhythm no murmurs Left leg with mild to moderate discomfort along the course of great saphenous vein with minimal erythema distal thigh the saphenofemoral junction. No distal edema noted. 3 posterior cells pedis pulse palpable. Today I ordered a venous duplex exam of the left leg which are reviewed and interpreted. Left great saphenous vein is closed from the mid thigh to 0.4 cm from the saphenofemoral junction. There is no DVT.    Assessment:     Successful bilateral laser ablation great saphenous veins for venous hypertension swelling and pain    Plan:     Long-leg elastic compression stocking 20-30 mm gradient for one more week on the left leg Return to see [place] on when necessary basis

## 2012-12-06 ENCOUNTER — Emergency Department (HOSPITAL_BASED_OUTPATIENT_CLINIC_OR_DEPARTMENT_OTHER): Payer: 59

## 2012-12-06 ENCOUNTER — Encounter (HOSPITAL_BASED_OUTPATIENT_CLINIC_OR_DEPARTMENT_OTHER): Payer: Self-pay | Admitting: Emergency Medicine

## 2012-12-06 ENCOUNTER — Emergency Department (HOSPITAL_BASED_OUTPATIENT_CLINIC_OR_DEPARTMENT_OTHER)
Admission: EM | Admit: 2012-12-06 | Discharge: 2012-12-06 | Disposition: A | Discharge status: Home / Self Care | Payer: 59 | Attending: Emergency Medicine | Admitting: Emergency Medicine

## 2012-12-06 DIAGNOSIS — J029 Acute pharyngitis, unspecified: Secondary | ICD-10-CM | POA: Insufficient documentation

## 2012-12-06 DIAGNOSIS — I10 Essential (primary) hypertension: Secondary | ICD-10-CM | POA: Insufficient documentation

## 2012-12-06 DIAGNOSIS — R509 Fever, unspecified: Secondary | ICD-10-CM | POA: Insufficient documentation

## 2012-12-06 DIAGNOSIS — J40 Bronchitis, not specified as acute or chronic: Secondary | ICD-10-CM | POA: Insufficient documentation

## 2012-12-06 DIAGNOSIS — R059 Cough, unspecified: Secondary | ICD-10-CM | POA: Insufficient documentation

## 2012-12-06 DIAGNOSIS — J3489 Other specified disorders of nose and nasal sinuses: Secondary | ICD-10-CM | POA: Insufficient documentation

## 2012-12-06 DIAGNOSIS — J45909 Unspecified asthma, uncomplicated: Secondary | ICD-10-CM | POA: Insufficient documentation

## 2012-12-06 DIAGNOSIS — R05 Cough: Secondary | ICD-10-CM | POA: Insufficient documentation

## 2012-12-06 DIAGNOSIS — R062 Wheezing: Secondary | ICD-10-CM | POA: Insufficient documentation

## 2012-12-06 LAB — CBC WITH DIFFERENTIAL/PLATELET
Basophils Absolute: 0 10*3/uL (ref 0.0–0.1)
Basophils Relative: 0 % (ref 0–1)
Eosinophils Absolute: 0 10*3/uL (ref 0.0–0.7)
Eosinophils Relative: 0 % (ref 0–5)
HCT: 38 % (ref 36.0–46.0)
Hemoglobin: 13.3 g/dL (ref 12.0–15.0)
Lymphocytes Relative: 9 % — ABNORMAL LOW (ref 12–46)
Lymphs Abs: 0.6 10*3/uL — ABNORMAL LOW (ref 0.7–4.0)
MCH: 31.5 pg (ref 26.0–34.0)
MCHC: 35 g/dL (ref 30.0–36.0)
MCV: 90 fL (ref 78.0–100.0)
Monocytes Absolute: 0.7 10*3/uL (ref 0.1–1.0)
Monocytes Relative: 11 % (ref 3–12)
Neutro Abs: 5.3 10*3/uL (ref 1.7–7.7)
Neutrophils Relative %: 80 % — ABNORMAL HIGH (ref 43–77)
Platelets: 189 10*3/uL (ref 150–400)
RBC: 4.22 MIL/uL (ref 3.87–5.11)
RDW: 12.2 % (ref 11.5–15.5)
WBC: 6.6 10*3/uL (ref 4.0–10.5)

## 2012-12-06 LAB — D-DIMER, QUANTITATIVE: D-Dimer, Quant: 1.2 ug/mL-FEU — ABNORMAL HIGH (ref 0.00–0.48)

## 2012-12-06 LAB — BASIC METABOLIC PANEL
BUN: 11 mg/dL (ref 6–23)
CO2: 25 mEq/L (ref 19–32)
Calcium: 9.4 mg/dL (ref 8.4–10.5)
Chloride: 96 mEq/L (ref 96–112)
Creatinine, Ser: 0.7 mg/dL (ref 0.50–1.10)
GFR calc Af Amer: >90 mL/min (ref >90)
GFR calc non Af Amer: >90 mL/min (ref >90)
Glucose, Bld: 128 mg/dL — ABNORMAL HIGH (ref 70–99)
Potassium: 3.7 mEq/L (ref 3.5–5.1)
Sodium: 134 mEq/L — ABNORMAL LOW (ref 135–145)

## 2012-12-06 LAB — RAPID STREP SCREEN (MED CTR MEBANE ONLY): Streptococcus, Group A Screen (Direct): NEGATIVE

## 2012-12-06 LAB — TROPONIN I: Troponin I: <0.3 ng/mL (ref <0.30)

## 2012-12-06 MED ORDER — ALBUTEROL SULFATE (5 MG/ML) 0.5% IN NEBU
INHALATION_SOLUTION | RESPIRATORY_TRACT | Status: AC
  Filled 2012-12-06: qty 0.5

## 2012-12-06 MED ORDER — IBUPROFEN 600 MG PO TABS: 600.0000 mg | ORAL_TABLET | Freq: Four times a day (QID) | ORAL | Status: DC | PRN

## 2012-12-06 MED ORDER — PREDNISONE 20 MG PO TABS: ORAL_TABLET | ORAL | Status: DC

## 2012-12-06 MED ORDER — IOHEXOL 350 MG/ML SOLN
80.0000 mL | Freq: Once | INTRAVENOUS | Status: AC | PRN
  Administered 2012-12-06: 80 mL via INTRAVENOUS

## 2012-12-06 MED ORDER — AZITHROMYCIN 250 MG PO TABS: ORAL_TABLET | ORAL | Status: DC

## 2012-12-06 MED ORDER — ALBUTEROL SULFATE (5 MG/ML) 0.5% IN NEBU
2.5000 mg | INHALATION_SOLUTION | Freq: Once | RESPIRATORY_TRACT | Status: AC
  Administered 2012-12-06: 2.5 mg via RESPIRATORY_TRACT

## 2012-12-06 NOTE — ED Provider Notes (Signed)
History     CSN: [id number]  Arrival date & time 12/06/12  4098   First MD Initiated Contact with Patient 12/06/12 0431      Chief Complaint  Patient presents with  . Chest Pain    (Consider location/radiation/quality/duration/timing/severity/associated sxs/prior treatment) Patient is a 51 y.o. female presenting with chest pain and cough. The history is provided by the patient.  Chest Pain The chest pain began yesterday. Duration of episode(s) is 1 day. Chest pain occurs constantly. The chest pain is unchanged. The pain is associated with coughing. The quality of the pain is described as tightness. The pain does not radiate. Primary symptoms include a fever, cough and wheezing. Pertinent negatives for primary symptoms include no syncope, no nausea, no dizziness and no altered mental status.  The patient's medical history is significant for asthma.  Pertinent negatives for associated symptoms include no weakness. She tried nothing for the symptoms. Risk factors include obesity.  Pertinent negatives for past medical history include no MI.  Procedure history is negative for cardiac catheterization.    Cough This is a new problem. The current episode started yesterday. The problem occurs constantly. The problem has not changed since onset.The cough is non-productive. The maximum temperature recorded prior to her arrival was 102 to 102.9 F. The fever has been present for less than 1 day. Associated symptoms include chest pain, sore throat and wheezing. Pertinent negatives include no ear congestion, no ear pain and no eye redness. She has tried nothing for the symptoms. The treatment provided no relief. Her past medical history is significant for asthma.    Past Medical History  Diagnosis Date  . Leg pain   . Asthma   . Hypertension   . Sinusitis   . Tonsillitis     Past Surgical History  Procedure Date  . Abdominal hysterectomy   . Anal fissurectomy 1999  . Endoscopic vein laser  treatment     Family History  Problem Relation Age of Onset  . Stroke Mother   . Hypertension Mother   . Cancer Father   . Hypertension Father   . Hyperlipidemia Father   . Hypertension Sister     History  Substance Use Topics  . Smoking status: Never Smoker   . Smokeless tobacco: Never Used  . Alcohol Use: Yes    OB History    Grav Para Term Preterm Abortions TAB SAB Ect Mult Living                  Review of Systems  Constitutional: Positive for fever.  HENT: Positive for congestion and sore throat. Negative for ear pain.   Eyes: Negative for redness.  Respiratory: Positive for cough and wheezing.   Cardiovascular: Positive for chest pain. Negative for leg swelling and syncope.  Gastrointestinal: Negative for nausea.  Neurological: Negative for dizziness and weakness.  Psychiatric/Behavioral: Negative for altered mental status.  All other systems reviewed and are negative.    Allergies  Benadryl  Home Medications   Current Outpatient Rx  Name  Route  Sig  Dispense  Refill  . ASPIRIN 81 MG PO TABS   Oral   Take 81 mg by mouth daily.         Marland Kitchen HYDROCHLOROTHIAZIDE 25 MG PO TABS   Oral   Take 25 mg by mouth daily. Take two (25 mg) tablets daily.         Marland Kitchen METOPROLOL SUCCINATE ER 25 MG PO TB24   Oral   Take  25 mg by mouth daily.         . MULTIVITAMINS PO CAPS   Oral   Take 1 capsule by mouth daily.         . ALBUTEROL SULFATE HFA 108 (90 BASE) MCG/ACT IN AERS   Inhalation   Inhale 2 puffs into the lungs every 6 (six) hours as needed.         . AMOXICILLIN-POT CLAVULANATE 875-125 MG PO TABS                 BP 135/78  Pulse 114  Temp 99.9 F (37.7 C) (Oral)  Resp 18  Ht 5\' 9"  (1.753 m)  Wt 210 lb (95.255 kg)  BMI 31.01 kg/m2  SpO2 94%  Physical Exam  Constitutional: She is oriented to person, place, and time. She appears well-developed and well-nourished. No distress.  HENT:  Head: Normocephalic and atraumatic.   Mouth/Throat: Oropharynx is clear and moist. No oropharyngeal exudate.  Eyes: Conjunctivae normal are normal. Pupils are equal, round, and reactive to light.  Neck: Normal range of motion. Neck supple.  Cardiovascular: Regular rhythm.  Tachycardia present.   Pulmonary/Chest: Effort normal and breath sounds normal. No stridor. No respiratory distress. She has no rales.  Abdominal: Soft. Bowel sounds are normal. There is no tenderness. There is no rebound and no guarding.  Musculoskeletal: Normal range of motion. She exhibits no edema.  Lymphadenopathy:    She has no cervical adenopathy.  Neurological: She is alert and oriented to person, place, and time. She has normal reflexes.  Skin: Skin is warm and dry.  Psychiatric: She has a normal mood and affect.    ED Course  Procedures (including critical care time)  Labs Reviewed  CBC WITH DIFFERENTIAL - Abnormal; Notable for the following:    Neutrophils Relative 80 (*)     Lymphocytes Relative 9 (*)     Lymphs Abs 0.6 (*)     All other components within normal limits  BASIC METABOLIC PANEL - Abnormal; Notable for the following:    Sodium 134 (*)     Glucose, Bld 128 (*)     All other components within normal limits  D-DIMER, QUANTITATIVE - Abnormal; Notable for the following:    D-Dimer, Quant 1.20 (*)     All other components within normal limits  TROPONIN I  RAPID STREP SCREEN   Dg Chest 2 View  12/06/2012  *RADIOLOGY REPORT*  Clinical Data: Mid chest pain  CHEST - 2 VIEW  Comparison: 01/24/2010  Findings: Lungs are clear. No pleural effusion or pneumothorax. The cardiomediastinal contours are within normal limits. The visualized bones and soft tissues are without significant appreciable abnormality.  IMPRESSION: No radiographic evidence of acute cardiopulmonary process.   Original Report Authenticated By: Jearld Lesch, M.D.      No diagnosis found.    MDM   Date: 12/06/2012  Rate: 113  Rhythm: sinus tachycardia  QRS  Axis: normal  Intervals: PR prolonged  ST/T Wave abnormalities: normal  Conduction Disutrbances: none  Narrative Interpretation:   Old EKG Reviewed: unchanged      No leg pain.  In the setting of > 24 hours of ongoing pain that is constant and negative EKG ACS is excluded.  No PE.  Symptoms are consistent with bronchitis.  Will treat and have patient follow up with PMD.  Return for chest pain shortness of breath or any concerns.  Patient should be using her inhaler Q4 hrs as prescribed.  Patient  verbalizes understanding and agrees to follow up  Ladavion Savitz Smitty Cords, MD 12/06/12 [phone]

## 2012-12-06 NOTE — ED Notes (Signed)
Pt developed upper respiratory like symptoms yesterday, + cough runny nose, head congestion, could not stop coughing last night at work. Today she developed centeralized  Chest pain radiating to left flank

## 2013-07-14 ENCOUNTER — Other Ambulatory Visit: Payer: Self-pay | Admitting: Obstetrics and Gynecology

## 2013-07-14 DIAGNOSIS — Z1231 Encounter for screening mammogram for malignant neoplasm of breast: Secondary | ICD-10-CM

## 2013-07-29 ENCOUNTER — Ambulatory Visit
Admission: RE | Admit: 2013-07-29 | Discharge: 2013-07-29 | Disposition: A | Discharge status: Home / Self Care | Payer: 59 | Source: Ambulatory Visit | Attending: Obstetrics and Gynecology | Admitting: Obstetrics and Gynecology

## 2013-07-29 DIAGNOSIS — Z1231 Encounter for screening mammogram for malignant neoplasm of breast: Secondary | ICD-10-CM

## 2014-07-12 ENCOUNTER — Other Ambulatory Visit: Payer: Self-pay

## 2014-07-12 DIAGNOSIS — Z1231 Encounter for screening mammogram for malignant neoplasm of breast: Secondary | ICD-10-CM

## 2014-07-30 ENCOUNTER — Ambulatory Visit
Admission: RE | Admit: 2014-07-30 | Discharge: 2014-07-30 | Disposition: A | Discharge status: Home / Self Care | Payer: 59 | Source: Ambulatory Visit

## 2014-07-30 DIAGNOSIS — Z1231 Encounter for screening mammogram for malignant neoplasm of breast: Secondary | ICD-10-CM

## 2014-08-04 ENCOUNTER — Other Ambulatory Visit: Payer: Self-pay | Admitting: Obstetrics and Gynecology

## 2014-08-04 DIAGNOSIS — R928 Other abnormal and inconclusive findings on diagnostic imaging of breast: Secondary | ICD-10-CM

## 2014-08-10 ENCOUNTER — Ambulatory Visit
Admission: RE | Admit: 2014-08-10 | Discharge: 2014-08-10 | Disposition: A | Discharge status: Home / Self Care | Payer: 59 | Source: Ambulatory Visit | Attending: Obstetrics and Gynecology | Admitting: Obstetrics and Gynecology

## 2014-08-10 DIAGNOSIS — R928 Other abnormal and inconclusive findings on diagnostic imaging of breast: Secondary | ICD-10-CM

## 2015-07-15 ENCOUNTER — Other Ambulatory Visit: Payer: Self-pay

## 2015-07-15 DIAGNOSIS — Z1231 Encounter for screening mammogram for malignant neoplasm of breast: Secondary | ICD-10-CM

## 2015-08-02 ENCOUNTER — Ambulatory Visit
Admission: RE | Admit: 2015-08-02 | Discharge: 2015-08-02 | Disposition: A | Discharge status: Home / Self Care | Payer: 59 | Source: Ambulatory Visit

## 2015-08-02 DIAGNOSIS — Z1231 Encounter for screening mammogram for malignant neoplasm of breast: Secondary | ICD-10-CM

## 2015-12-02 ENCOUNTER — Emergency Department (HOSPITAL_BASED_OUTPATIENT_CLINIC_OR_DEPARTMENT_OTHER): Payer: 59

## 2015-12-02 ENCOUNTER — Emergency Department (HOSPITAL_BASED_OUTPATIENT_CLINIC_OR_DEPARTMENT_OTHER)
Admission: EM | Admit: 2015-12-02 | Discharge: 2015-12-02 | Disposition: A | Discharge status: Home / Self Care | Payer: 59 | Attending: Emergency Medicine | Admitting: Emergency Medicine

## 2015-12-02 ENCOUNTER — Encounter (HOSPITAL_BASED_OUTPATIENT_CLINIC_OR_DEPARTMENT_OTHER): Payer: Self-pay | Admitting: *Deleted

## 2015-12-02 DIAGNOSIS — Z792 Long term (current) use of antibiotics: Secondary | ICD-10-CM | POA: Diagnosis not present

## 2015-12-02 DIAGNOSIS — I10 Essential (primary) hypertension: Secondary | ICD-10-CM | POA: Insufficient documentation

## 2015-12-02 DIAGNOSIS — Z79899 Other long term (current) drug therapy: Secondary | ICD-10-CM | POA: Insufficient documentation

## 2015-12-02 DIAGNOSIS — Z90710 Acquired absence of both cervix and uterus: Secondary | ICD-10-CM | POA: Insufficient documentation

## 2015-12-02 DIAGNOSIS — K5730 Diverticulosis of large intestine without perforation or abscess without bleeding: Secondary | ICD-10-CM | POA: Insufficient documentation

## 2015-12-02 DIAGNOSIS — J45909 Unspecified asthma, uncomplicated: Secondary | ICD-10-CM | POA: Diagnosis not present

## 2015-12-02 DIAGNOSIS — R109 Unspecified abdominal pain: Secondary | ICD-10-CM

## 2015-12-02 DIAGNOSIS — R110 Nausea: Secondary | ICD-10-CM

## 2015-12-02 DIAGNOSIS — Z8709 Personal history of other diseases of the respiratory system: Secondary | ICD-10-CM | POA: Diagnosis not present

## 2015-12-02 DIAGNOSIS — Z7982 Long term (current) use of aspirin: Secondary | ICD-10-CM | POA: Diagnosis not present

## 2015-12-02 DIAGNOSIS — R1032 Left lower quadrant pain: Secondary | ICD-10-CM | POA: Diagnosis present

## 2015-12-02 LAB — URINE MICROSCOPIC-ADD ON

## 2015-12-02 LAB — URINALYSIS, ROUTINE W REFLEX MICROSCOPIC
Bilirubin Urine: NEGATIVE
Glucose, UA: NEGATIVE mg/dL
Hgb urine dipstick: NEGATIVE
Ketones, ur: NEGATIVE mg/dL
Nitrite: NEGATIVE
Protein, ur: NEGATIVE mg/dL
Specific Gravity, Urine: 1.015 (ref 1.005–1.030)
pH: 6 (ref 5.0–8.0)

## 2015-12-02 LAB — CBC WITH DIFFERENTIAL/PLATELET
Basophils Absolute: 0 10*3/uL (ref 0.0–0.1)
Basophils Relative: 0 %
Eosinophils Absolute: 0.5 10*3/uL (ref 0.0–0.7)
Eosinophils Relative: 7 %
HCT: 39.9 % (ref 36.0–46.0)
Hemoglobin: 13.4 g/dL (ref 12.0–15.0)
Lymphocytes Relative: 32 %
Lymphs Abs: 2.3 10*3/uL (ref 0.7–4.0)
MCH: 30.1 pg (ref 26.0–34.0)
MCHC: 33.6 g/dL (ref 30.0–36.0)
MCV: 89.7 fL (ref 78.0–100.0)
Monocytes Absolute: 0.6 10*3/uL (ref 0.1–1.0)
Monocytes Relative: 9 %
Neutro Abs: 3.8 10*3/uL (ref 1.7–7.7)
Neutrophils Relative %: 52 %
Platelets: 264 10*3/uL (ref 150–400)
RBC: 4.45 MIL/uL (ref 3.87–5.11)
RDW: 12.2 % (ref 11.5–15.5)
WBC: 7.3 10*3/uL (ref 4.0–10.5)

## 2015-12-02 LAB — COMPREHENSIVE METABOLIC PANEL
ALT: 48 U/L (ref 14–54)
AST: 49 U/L — ABNORMAL HIGH (ref 15–41)
Albumin: 4.1 g/dL (ref 3.5–5.0)
Alkaline Phosphatase: 89 U/L (ref 38–126)
Anion gap: 8 (ref 5–15)
BUN: 16 mg/dL (ref 6–20)
CO2: 23 mmol/L (ref 22–32)
Calcium: 9.2 mg/dL (ref 8.9–10.3)
Chloride: 106 mmol/L (ref 101–111)
Creatinine, Ser: 1 mg/dL (ref 0.44–1.00)
GFR calc Af Amer: >60 mL/min (ref >60)
GFR calc non Af Amer: >60 mL/min (ref >60)
Glucose, Bld: 91 mg/dL (ref 65–99)
Potassium: 3.8 mmol/L (ref 3.5–5.1)
Sodium: 137 mmol/L (ref 135–145)
Total Bilirubin: 1.3 mg/dL — ABNORMAL HIGH (ref 0.3–1.2)
Total Protein: 6.9 g/dL (ref 6.5–8.1)

## 2015-12-02 LAB — TROPONIN I: Troponin I: <0.03 ng/mL (ref <0.031)

## 2015-12-02 LAB — LIPASE, BLOOD: Lipase: 30 U/L (ref 11–51)

## 2015-12-02 MED ORDER — HYDROMORPHONE HCL 1 MG/ML IJ SOLN
1.0000 mg | Freq: Once | INTRAMUSCULAR | Status: AC
  Administered 2015-12-02: 1 mg via INTRAVENOUS
  Filled 2015-12-02: qty 1

## 2015-12-02 MED ORDER — ONDANSETRON 4 MG PO TBDP: 4.0000 mg | ORAL_TABLET | Freq: Three times a day (TID) | ORAL | Status: DC | PRN

## 2015-12-02 MED ORDER — OXYCODONE-ACETAMINOPHEN 5-325 MG PO TABS: 1.0000 | ORAL_TABLET | ORAL | Status: DC | PRN

## 2015-12-02 MED ORDER — IOHEXOL 300 MG/ML  SOLN
50.0000 mL | Freq: Once | INTRAMUSCULAR | Status: AC | PRN
  Administered 2015-12-02: 50 mL via ORAL

## 2015-12-02 MED ORDER — ONDANSETRON HCL 4 MG/2ML IJ SOLN
4.0000 mg | Freq: Once | INTRAMUSCULAR | Status: AC
  Administered 2015-12-02: 4 mg via INTRAVENOUS
  Filled 2015-12-02: qty 2

## 2015-12-02 MED ORDER — IOHEXOL 300 MG/ML  SOLN
100.0000 mL | Freq: Once | INTRAMUSCULAR | Status: AC | PRN
  Administered 2015-12-02: 100 mL via INTRAVENOUS

## 2015-12-02 NOTE — ED Notes (Signed)
Pt calls Misty, rn to bedside, reporting sudden onset of epigastric pressure. ekg performed, pa is at bedside for eval.

## 2015-12-02 NOTE — ED Provider Notes (Signed)
CSN: [id number]     Arrival date & time 12/02/15  1542 History   First MD Initiated Contact with Patient 12/02/15 1646     Chief Complaint  Patient presents with  . Flank Pain     (Consider location/radiation/quality/duration/timing/severity/associated sxs/prior Treatment) Patient is a 54 y.o. female presenting with flank pain. The history is provided by the patient and medical records.  Flank Pain Associated symptoms include abdominal pain and nausea.    This is a 54 year old female with history of hypertension, asthma, sinusitis, presenting to the ED for left lower abdominal pain for the past week. Patient states she was seen by her PCP earlier this week. She reports that a CT scan on Tuesday and was told she had "pre-diverticulitis". She states she was started on Bactrim and Flagyl, however pain has worsened. She states initially pain was mild and crampy, now it is constant, sharp, and stabbing. She reports associated nausea but denies vomiting.  She denies any diarrhea, no melena or hematochezia. Prior abdominal surgeries include hysterectomy, anal fissurectomy, and left oophorectomy. Patient denies any fever or chills. She has taken Aleve and Tylenol PM without relief.  Past Medical History  Diagnosis Date  . Leg pain   . Asthma   . Hypertension   . Sinusitis   . Tonsillitis    Past Surgical History  Procedure Laterality Date  . Abdominal hysterectomy    . Anal fissurectomy  1999  . Endoscopic vein laser treatment     Family History  Problem Relation Age of Onset  . Stroke Mother   . Hypertension Mother   . Cancer Father   . Hypertension Father   . Hyperlipidemia Father   . Hypertension Sister    Social History  Substance Use Topics  . Smoking status: Never Smoker   . Smokeless tobacco: Never Used  . Alcohol Use: Yes     Comment: none in 2.5 months   OB History    No data available     Review of Systems  Gastrointestinal: Positive for nausea and abdominal  pain.  All other systems reviewed and are negative.     Allergies  Benadryl  Home Medications   Prior to Admission medications   Medication Sig Start Date End Date Taking? Authorizing Provider  Ciprofloxacin (CIPRO PO) Take by mouth.   Yes Historical Provider, MD  sulfamethoxazole-trimethoprim (BACTRIM DS,SEPTRA DS) 800-160 MG tablet Take 1 tablet by mouth 2 (two) times daily.   Yes Historical Provider, MD  albuterol (PROAIR HFA) 108 (90 BASE) MCG/ACT inhaler Inhale 2 puffs into the lungs every 6 (six) hours as needed.    Historical Provider, MD  amoxicillin-clavulanate (AUGMENTIN) 875-125 MG per tablet  01/14/12   Historical Provider, MD  aspirin 81 MG tablet Take 81 mg by mouth daily.    Historical Provider, MD  azithromycin (ZITHROMAX Z-PAK) 250 MG tablet 2 po day one, then 1 daily x 4 days 12/06/12   April Palumbo, MD  hydrochlorothiazide (HYDRODIURIL) 25 MG tablet Take 25 mg by mouth daily. Take two (25 mg) tablets daily.    Historical Provider, MD  ibuprofen (ADVIL,MOTRIN) 600 MG tablet Take 1 tablet (600 mg total) by mouth every 6 (six) hours as needed for pain. 12/06/12   April Palumbo, MD  metoprolol succinate (TOPROL-XL) 25 MG 24 hr tablet Take 25 mg by mouth daily.    Historical Provider, MD  Multiple Vitamin (MULTIVITAMIN) capsule Take 1 capsule by mouth daily.    Historical Provider, MD  predniSONE (DELTASONE)  20 MG tablet 3 tabs po day one, then 2 po daily x 4 days 12/06/12   April Palumbo, MD   BP 121/78 mmHg  Pulse 87  Temp(Src) 98.1 F (36.7 C) (Oral)  Resp 18  Ht 5\' 9"  (1.753 m)  Wt 95.255 kg  BMI 31.00 kg/m2  SpO2 99%   Physical Exam  Constitutional: She is oriented to person, place, and time. She appears well-developed and well-nourished. No distress.  Appears uncomfortable  HENT:  Head: Normocephalic and atraumatic.  Mouth/Throat: Oropharynx is clear and moist.  Eyes: Conjunctivae and EOM are normal. Pupils are equal, round, and reactive to light.  Neck:  Normal range of motion.  Cardiovascular: Normal rate, regular rhythm and normal heart sounds.   Pulmonary/Chest: Effort normal and breath sounds normal. No respiratory distress. She has no wheezes.  Abdominal: Soft. Bowel sounds are normal. There is tenderness in the periumbilical area, suprapubic area and left lower quadrant. There is no guarding.    Musculoskeletal: Normal range of motion.  Neurological: She is alert and oriented to person, place, and time.  Skin: Skin is warm and dry. She is not diaphoretic.  Psychiatric: She has a normal mood and affect.  Nursing note and vitals reviewed.   ED Course  Procedures (including critical care time) Labs Review Labs Reviewed  URINALYSIS, ROUTINE W REFLEX MICROSCOPIC (NOT AT [facility]) - Abnormal; Notable for the following:    APPearance CLOUDY (*)    Leukocytes, UA LARGE (*)    All other components within normal limits  URINE MICROSCOPIC-ADD ON - Abnormal; Notable for the following:    Squamous Epithelial / LPF 0-5 (*)    Bacteria, UA RARE (*)    All other components within normal limits  COMPREHENSIVE METABOLIC PANEL - Abnormal; Notable for the following:    AST 49 (*)    Total Bilirubin 1.3 (*)    All other components within normal limits  CBC WITH DIFFERENTIAL/PLATELET  LIPASE, BLOOD  TROPONIN I    Imaging Review Ct Abdomen Pelvis W Contrast  12/02/2015  CLINICAL DATA:  Left lower quadrant pain. History of diverticulitis. EXAM: CT ABDOMEN AND PELVIS WITH CONTRAST TECHNIQUE: Multidetector CT imaging of the abdomen and pelvis was performed using the standard protocol following bolus administration of intravenous contrast. CONTRAST:  129mL OMNIPAQUE IOHEXOL 300 MG/ML SOLN, 60mL OMNIPAQUE IOHEXOL 300 MG/ML SOLN COMPARISON:  None. FINDINGS: Lower chest:  No pleural fluid.  The lung bases appear clear. Hepatobiliary: There is no suspicious liver abnormality. Cyst is identified within the posterior right hepatic lobe. The gallbladder is  normal. No biliary dilatation. Pancreas: Normal appearance of the pancreas. Spleen: Negative. Adrenals/Urinary Tract: The adrenal glands are normal. Unremarkable appearance of the kidneys. No obstructive uropathy. Urinary bladder appears normal. Stomach/Bowel: The stomach is within normal limits. The small bowel loops have a normal course and caliber. No obstruction. Normal appearance of the colon. Vascular/Lymphatic: Normal appearance of the abdominal aorta. No enlarged retroperitoneal or mesenteric adenopathy. No enlarged pelvic or inguinal lymph nodes. Reproductive: The patient is status post hysterectomy. No adnexal mass. Other: There is no ascites or focal fluid collections within the abdomen or pelvis. Musculoskeletal: Mild degenerative disc disease noted within the lumbar spine. No aggressive lytic or sclerotic bone lesions. IMPRESSION: 1. No acute findings within the abdomen or pelvis. No explanation for left lower quadrant pain and no evidence for diverticulitis. Electronically Signed   By: Kerby Moors M.D.   On: 12/02/2015 19:37   I have personally reviewed and  evaluated these images and lab results as part of my medical decision-making.   EKG Interpretation   Date/Time:  Friday December 02 2015 18:35:52 EST Ventricular Rate:  61 PR Interval:  144 QRS Duration: 101 QT Interval:  422 QTC Calculation: 425 R Axis:   69 Text Interpretation:  Sinus rhythm baseline wander in inferior leads Since  previous tracing rate slower Confirmed by [facility]  MD, MARTHA [phone]) on  12/02/2015 7:43:55 PM      MDM   Final diagnoses:  Abdominal pain, unspecified abdominal location  Nausea  Diverticulosis of large intestine without hemorrhage   54 year old female here with left lower abdominal pain for the past week. Patient is afebrile, nontoxic. She has tenderness of her left lower quadrant, suprapubic region, and periumbilical region. There is no peritonitis. I have obtained patient's CT report,  it shows diverticulosis without evidence of diverticulitis. She's been taking Flagyl and Bactrim, uncertain why abx were started.  Given worsening pain, patient may certainly have developed diverticulitis.  Will repeat labs and CT scan.  6:41 PM Received EKG from NT.  Went back into assess patient as EKG was not ordered by provider.  After drinking contrast began having some epigastric/mid-sternal CP.  No cardiac hx.  EKG reassuring.  Will add troponin.  CT scan pending.  Troponin negative.  Epigastric/chest pain resolved without intervention-- this may have been related to drinking contrast. Doubt ACS, PE, dissection, or other cardiac event at this time. CT scan negative for acute findings, specifically no evidence of diverticulitis.  These results were discussed with patient, she now discloses to me that she has recently changed her diet and is eating a solely plant based diet-- this may be contributing to her symptoms. She did have a colonoscopy 5 years ago with Dr. Collene Mares and was told this was normal. Patient will be treated symptomatically, will stop abx as there is no indication for them at this time. She is instructed to follow-up with her GI physician next week if she does not have any improvement of her symptoms. Also recommended to limit nuts, seeds, corn, popcorn, etc to reduce chances of diverticulitis.  Patient given information regarding diverticulosis as well.  Discussed plan with patient, he/she acknowledged understanding and agreed with plan of care.  Return precautions given for new or worsening symptoms.  Larene Pickett, PA-C 12/02/15 2035  Alfonzo Beers, MD 12/02/15 [phone]

## 2015-12-02 NOTE — ED Notes (Addendum)
Left flank pain for a week. She had a CT that showed diverticulitis. She is taking Bactrim DS and Flagyl. Pain is no better.

## 2015-12-02 NOTE — Discharge Instructions (Signed)
Take the prescribed medication as directed.  Use caution when taking percocet, it can make you sleepy/drowsy. Stop taking the bactrim and flagyl. Follow-up with your primary care physician. Also recommend to follow-up with your GI physician if continue having pain into next week. Return to the ED for new or worsening symptoms. Diverticulosis Diverticulosis is the condition that develops when small pouches (diverticula) form in the wall of your colon. Your colon, or large intestine, is where water is absorbed and stool is formed. The pouches form when the inside layer of your colon pushes through weak spots in the outer layers of your colon. CAUSES  No one knows exactly what causes diverticulosis. RISK FACTORS  Being older than 46. Your risk for this condition increases with age. Diverticulosis is rare in people younger than 40 years. By age 25, almost everyone has it.  Eating a low-fiber diet.  Being frequently constipated.  Being overweight.  Not getting enough exercise.  Smoking.  Taking over-the-counter pain medicines, like aspirin and ibuprofen. SYMPTOMS  Most people with diverticulosis do not have symptoms. DIAGNOSIS  Because diverticulosis often has no symptoms, health care providers often discover the condition during an exam for other colon problems. In many cases, a health care provider will diagnose diverticulosis while using a flexible scope to examine the colon (colonoscopy). TREATMENT  If you have never developed an infection related to diverticulosis, you may not need treatment. If you have had an infection before, treatment may include:  Eating more fruits, vegetables, and grains.  Taking a fiber supplement.  Taking a live bacteria supplement (probiotic).  Taking medicine to relax your colon. HOME CARE INSTRUCTIONS   Drink at least 6-8 glasses of water each day to prevent constipation.  Try not to strain when you have a bowel movement.  Keep all follow-up  appointments. If you have had an infection before:  Increase the fiber in your diet as directed by your health care provider or dietitian.  Take a dietary fiber supplement if your health care provider approves.  Only take medicines as directed by your health care provider. SEEK MEDICAL CARE IF:   You have abdominal pain.  You have bloating.  You have cramps.  You have not gone to the bathroom in 3 days. SEEK IMMEDIATE MEDICAL CARE IF:   Your pain gets worse.  Yourbloating becomes very bad.  You have a fever or chills, and your symptoms suddenly get worse.  You begin vomiting.  You have bowel movements that are bloody or black. MAKE SURE YOU:  Understand these instructions.  Will watch your condition.  Will get help right away if you are not doing well or get worse.   This information is not intended to replace advice given to you by your health care provider. Make sure you discuss any questions you have with your health care provider.   Document Released: 08/30/2004 Document Revised: 12/08/2013 Document Reviewed: 10/28/2013 Elsevier Interactive Patient Education [employer].

## 2017-04-14 ENCOUNTER — Emergency Department (HOSPITAL_COMMUNITY): Payer: 59

## 2017-04-14 ENCOUNTER — Emergency Department (HOSPITAL_COMMUNITY)
Admission: EM | Admit: 2017-04-14 | Discharge: 2017-04-14 | Disposition: A | Discharge status: Home / Self Care | Payer: 59 | Attending: Emergency Medicine | Admitting: Emergency Medicine

## 2017-04-14 ENCOUNTER — Encounter (HOSPITAL_COMMUNITY): Payer: Self-pay

## 2017-04-14 DIAGNOSIS — S0990XA Unspecified injury of head, initial encounter: Secondary | ICD-10-CM | POA: Diagnosis present

## 2017-04-14 DIAGNOSIS — Z7982 Long term (current) use of aspirin: Secondary | ICD-10-CM | POA: Insufficient documentation

## 2017-04-14 DIAGNOSIS — S060X0A Concussion without loss of consciousness, initial encounter: Secondary | ICD-10-CM | POA: Diagnosis not present

## 2017-04-14 DIAGNOSIS — J45909 Unspecified asthma, uncomplicated: Secondary | ICD-10-CM | POA: Insufficient documentation

## 2017-04-14 DIAGNOSIS — Y929 Unspecified place or not applicable: Secondary | ICD-10-CM | POA: Insufficient documentation

## 2017-04-14 DIAGNOSIS — S0083XA Contusion of other part of head, initial encounter: Secondary | ICD-10-CM

## 2017-04-14 DIAGNOSIS — W228XXA Striking against or struck by other objects, initial encounter: Secondary | ICD-10-CM | POA: Insufficient documentation

## 2017-04-14 DIAGNOSIS — Y9389 Activity, other specified: Secondary | ICD-10-CM | POA: Diagnosis not present

## 2017-04-14 DIAGNOSIS — I10 Essential (primary) hypertension: Secondary | ICD-10-CM | POA: Insufficient documentation

## 2017-04-14 DIAGNOSIS — Z79899 Other long term (current) drug therapy: Secondary | ICD-10-CM | POA: Diagnosis not present

## 2017-04-14 DIAGNOSIS — Y999 Unspecified external cause status: Secondary | ICD-10-CM | POA: Diagnosis not present

## 2017-04-14 MED ORDER — HYDROCODONE-ACETAMINOPHEN 5-325 MG PO TABS
1.0000 | ORAL_TABLET | Freq: Once | ORAL | Status: AC
  Administered 2017-04-14: 1 via ORAL
  Filled 2017-04-14: qty 1

## 2017-04-14 MED ORDER — HYDROCODONE-ACETAMINOPHEN 5-325 MG PO TABS: 1.0000 | ORAL_TABLET | Freq: Four times a day (QID) | ORAL | 0 refills | Status: DC | PRN

## 2017-04-14 MED ORDER — IBUPROFEN 800 MG PO TABS
800.0000 mg | ORAL_TABLET | Freq: Once | ORAL | Status: AC
  Administered 2017-04-14: 800 mg via ORAL
  Filled 2017-04-14: qty 1

## 2017-04-14 MED ORDER — IBUPROFEN 800 MG PO TABS: 800.0000 mg | ORAL_TABLET | Freq: Three times a day (TID) | ORAL | 0 refills | Status: DC | PRN

## 2017-04-14 NOTE — ED Provider Notes (Signed)
[facility] DEPT Provider Note   CSN: [id number] Arrival date & time: 04/14/17  1627     History   Chief Complaint Chief Complaint  Patient presents with  . Fall    HPI Samantha Roberson is a 56 y.o. female.  HPI Patient presents to the emergency department with injury following a fall.  Patient states that she hit her right for head.  She states she did not lose consciousness.  This is witnessed by family members well, who verifies she did not lose consciousness.  Patient does not have any other pain other than her neck and upper back.  Patient states that nothing seems make the condition better or worse.  She states that her head is throbbing in the area where the hematoma on the forehead is locatedThe patient denies chest pain, shortness of breath, headache,blurred vision, neck pain, fever, cough, weakness, numbness, dizziness, anorexia, edema, abdominal pain, nausea, vomiting, diarrhea, rash, back pain, dysuria, hematemesis, bloody stool, near syncope, or syncope. Past Medical History:  Diagnosis Date  . Asthma   . Hypertension   . Leg pain   . Sinusitis   . Tonsillitis     Patient Active Problem List   Diagnosis Date Noted  . Varicose veins of lower extremities with other complications 35/32/9924    Past Surgical History:  Procedure Laterality Date  . ABDOMINAL HYSTERECTOMY    . ANAL FISSURECTOMY  1999  . ENDOSCOPIC VEIN LASER TREATMENT      OB History    No data available       Home Medications    Prior to Admission medications   Medication Sig Start Date End Date Taking? Authorizing Provider  albuterol (PROAIR HFA) 108 (90 BASE) MCG/ACT inhaler Inhale 2 puffs into the lungs every 6 (six) hours as needed.    Historical Provider, MD  amoxicillin-clavulanate (AUGMENTIN) 875-125 MG per tablet  01/14/12   Historical Provider, MD  aspirin 81 MG tablet Take 81 mg by mouth daily.    Historical Provider, MD  azithromycin (ZITHROMAX Z-PAK) 250 MG tablet 2 po day  one, then 1 daily x 4 days 12/06/12   April Palumbo, MD  Ciprofloxacin (CIPRO PO) Take by mouth.    Historical Provider, MD  hydrochlorothiazide (HYDRODIURIL) 25 MG tablet Take 25 mg by mouth daily. Take two (25 mg) tablets daily.    Historical Provider, MD  ibuprofen (ADVIL,MOTRIN) 600 MG tablet Take 1 tablet (600 mg total) by mouth every 6 (six) hours as needed for pain. 12/06/12   April Palumbo, MD  metoprolol succinate (TOPROL-XL) 25 MG 24 hr tablet Take 25 mg by mouth daily.    Historical Provider, MD  Multiple Vitamin (MULTIVITAMIN) capsule Take 1 capsule by mouth daily.    Historical Provider, MD  ondansetron (ZOFRAN ODT) 4 MG disintegrating tablet Take 1 tablet (4 mg total) by mouth every 8 (eight) hours as needed for nausea. 12/02/15   Larene Pickett, PA-C  oxyCODONE-acetaminophen (PERCOCET/ROXICET) 5-325 MG tablet Take 1 tablet by mouth every 4 (four) hours as needed. 12/02/15   Larene Pickett, PA-C  predniSONE (DELTASONE) 20 MG tablet 3 tabs po day one, then 2 po daily x 4 days 12/06/12   April Palumbo, MD  sulfamethoxazole-trimethoprim (BACTRIM DS,SEPTRA DS) 800-160 MG tablet Take 1 tablet by mouth 2 (two) times daily.    Historical Provider, MD    Family History Family History  Problem Relation Age of Onset  . Stroke Mother   . Hypertension Mother   . Cancer  Father   . Hypertension Father   . Hyperlipidemia Father   . Hypertension Sister     Social History Social History  Substance Use Topics  . Smoking status: Never Smoker  . Smokeless tobacco: Never Used  . Alcohol use Yes     Comment: none in 2.5 months     Allergies   Benadryl [diphenhydramine hcl]   Review of Systems Review of Systems  All other systems negative except as documented in the HPI. All pertinent positives and negatives as reviewed in the HPI. Physical Exam Updated Vital Signs BP 134/76   Pulse 86   Temp 98.3 F (36.8 C) (Oral)   Resp 20   SpO2 99%   Physical Exam  Constitutional: She  is oriented to person, place, and time. She appears well-developed and well-nourished. No distress.  HENT:  Head: Normocephalic.    Mouth/Throat: Oropharynx is clear and moist.  Eyes: Pupils are equal, round, and reactive to light.  Neck: Normal range of motion. Neck supple.  Cardiovascular: Normal rate, regular rhythm and normal heart sounds.  Exam reveals no gallop and no friction rub.   No murmur heard. Pulmonary/Chest: Effort normal and breath sounds normal. No respiratory distress. She has no wheezes.  Abdominal: Soft. Bowel sounds are normal. She exhibits no distension. There is no tenderness.  Neurological: She is alert and oriented to person, place, and time. She displays normal reflexes. No sensory deficit. She exhibits normal muscle tone. Coordination normal.  Skin: Skin is warm and dry. Capillary refill takes less than 2 seconds. No rash noted. No erythema.  Psychiatric: She has a normal mood and affect. Her behavior is normal.  Nursing note and vitals reviewed.    ED Treatments / Results  Labs (all labs ordered are listed, but only abnormal results are displayed) Labs Reviewed - No data to display  EKG  EKG Interpretation None       Radiology Ct Head Wo Contrast  Result Date: 04/14/2017 CLINICAL DATA:  Fall, striking head on concrete. Right forehead hematoma. EXAM: CT HEAD WITHOUT CONTRAST CT MAXILLOFACIAL WITHOUT CONTRAST CT CERVICAL SPINE WITHOUT CONTRAST TECHNIQUE: Multidetector CT imaging of the head, cervical spine, and maxillofacial structures were performed using the standard protocol without intravenous contrast. Multiplanar CT image reconstructions of the cervical spine and maxillofacial structures were also generated. COMPARISON:  01/08/2009 FINDINGS: CT HEAD FINDINGS Brain: Normal. No evidence of atrophy, old or acute infarction, mass lesion, hemorrhage, hydrocephalus or extra-axial collection. Vascular: No abnormal vascular finding. Skull: No skull fracture.  Other: Right forehead scalp hematoma.  No fluid in the sinuses. CT MAXILLOFACIAL FINDINGS Osseous: No facial fracture. Orbits: No intraorbital injury. Sinuses: No fluid in the sinuses. Soft tissues: Forehead and periorbital superficial hematoma. CT CERVICAL SPINE FINDINGS Alignment: Normal Skull base and vertebrae: No fracture Soft tissues and spinal canal: No soft tissue swelling or soft tissue injury. Disc levels: Degenerative spondylosis from C3-4 through C7-T1. Disc space narrowing with small endplate osteophytes. No severe narrowing of the canal or foramina. Facet arthropathy on the left at C7-T1. Upper chest: Negative except for mild scarring. Other: None IMPRESSION: Head CT: No intracranial abnormality. No skull fracture. Right forehead and periorbital hematoma. Maxillofacial CT: No fracture. No intraorbital injury. Right periorbital superficial hematoma. Cervical spine CT: No acute or traumatic finding. Ordinary spondylosis. Electronically Signed   By: Nelson Chimes M.D.   On: 04/14/2017 19:30   Ct Cervical Spine Wo Contrast  Result Date: 04/14/2017 CLINICAL DATA:  Fall, striking head on concrete.  Right forehead hematoma. EXAM: CT HEAD WITHOUT CONTRAST CT MAXILLOFACIAL WITHOUT CONTRAST CT CERVICAL SPINE WITHOUT CONTRAST TECHNIQUE: Multidetector CT imaging of the head, cervical spine, and maxillofacial structures were performed using the standard protocol without intravenous contrast. Multiplanar CT image reconstructions of the cervical spine and maxillofacial structures were also generated. COMPARISON:  01/08/2009 FINDINGS: CT HEAD FINDINGS Brain: Normal. No evidence of atrophy, old or acute infarction, mass lesion, hemorrhage, hydrocephalus or extra-axial collection. Vascular: No abnormal vascular finding. Skull: No skull fracture. Other: Right forehead scalp hematoma.  No fluid in the sinuses. CT MAXILLOFACIAL FINDINGS Osseous: No facial fracture. Orbits: No intraorbital injury. Sinuses: No fluid in the  sinuses. Soft tissues: Forehead and periorbital superficial hematoma. CT CERVICAL SPINE FINDINGS Alignment: Normal Skull base and vertebrae: No fracture Soft tissues and spinal canal: No soft tissue swelling or soft tissue injury. Disc levels: Degenerative spondylosis from C3-4 through C7-T1. Disc space narrowing with small endplate osteophytes. No severe narrowing of the canal or foramina. Facet arthropathy on the left at C7-T1. Upper chest: Negative except for mild scarring. Other: None IMPRESSION: Head CT: No intracranial abnormality. No skull fracture. Right forehead and periorbital hematoma. Maxillofacial CT: No fracture. No intraorbital injury. Right periorbital superficial hematoma. Cervical spine CT: No acute or traumatic finding. Ordinary spondylosis. Electronically Signed   By: Nelson Chimes M.D.   On: 04/14/2017 19:30   Ct Maxillofacial Wo Cm  Result Date: 04/14/2017 CLINICAL DATA:  Fall, striking head on concrete. Right forehead hematoma. EXAM: CT HEAD WITHOUT CONTRAST CT MAXILLOFACIAL WITHOUT CONTRAST CT CERVICAL SPINE WITHOUT CONTRAST TECHNIQUE: Multidetector CT imaging of the head, cervical spine, and maxillofacial structures were performed using the standard protocol without intravenous contrast. Multiplanar CT image reconstructions of the cervical spine and maxillofacial structures were also generated. COMPARISON:  01/08/2009 FINDINGS: CT HEAD FINDINGS Brain: Normal. No evidence of atrophy, old or acute infarction, mass lesion, hemorrhage, hydrocephalus or extra-axial collection. Vascular: No abnormal vascular finding. Skull: No skull fracture. Other: Right forehead scalp hematoma.  No fluid in the sinuses. CT MAXILLOFACIAL FINDINGS Osseous: No facial fracture. Orbits: No intraorbital injury. Sinuses: No fluid in the sinuses. Soft tissues: Forehead and periorbital superficial hematoma. CT CERVICAL SPINE FINDINGS Alignment: Normal Skull base and vertebrae: No fracture Soft tissues and spinal  canal: No soft tissue swelling or soft tissue injury. Disc levels: Degenerative spondylosis from C3-4 through C7-T1. Disc space narrowing with small endplate osteophytes. No severe narrowing of the canal or foramina. Facet arthropathy on the left at C7-T1. Upper chest: Negative except for mild scarring. Other: None IMPRESSION: Head CT: No intracranial abnormality. No skull fracture. Right forehead and periorbital hematoma. Maxillofacial CT: No fracture. No intraorbital injury. Right periorbital superficial hematoma. Cervical spine CT: No acute or traumatic finding. Ordinary spondylosis. Electronically Signed   By: Nelson Chimes M.D.   On: 04/14/2017 19:30    Procedures Procedures (including critical care time)  Medications Ordered in ED Medications  ibuprofen (ADVIL,MOTRIN) tablet 800 mg (not administered)  HYDROcodone-acetaminophen (NORCO/VICODIN) 5-325 MG per tablet 1 tablet (1 tablet Oral Given 04/14/17 1919)     Initial Impression / Assessment and Plan / ED Course  I have reviewed the triage vital signs and the nursing notes.  Pertinent labs & imaging results that were available during my care of the patient were reviewed by me and considered in my medical decision making (see chart for details).    Patient will be given treatment for concussive symptoms along with the forehead hematoma patient is an abrasion to  the forehead which will be cleaned and dressed.  The patient is advised to return here for any worsening in her condition.  Patient agrees the plan and all questions were answered  Final Clinical Impressions(s) / ED Diagnoses   Final diagnoses:  None    New Prescriptions New Prescriptions   No medications on file     Dalia Heading, PA-C 04/14/17 2009    Daleen Bo, MD 04/15/17 [phone]

## 2017-04-14 NOTE — Discharge Instructions (Signed)
Return here as needed.  Follow-up with your primary care doctor in the area on your 4, had clean and dry.  Use ice over the hematoma

## 2017-04-14 NOTE — ED Triage Notes (Signed)
PT arrives via EMS from home after falling and hitting head on concrete. PT has large hematoma to right forehead. Pt denies LOC and reports she does not take blood thinners.

## 2017-04-14 NOTE — ED Notes (Signed)
Pts head wound cleaned and bacitracin applied. Covered with 4x4 folded and secured with paper tape.

## 2017-04-14 NOTE — ED Notes (Signed)
Pt ambulated to bathroom with standby assist. Normal gait noted. Pt denied any dizziness, lightheadedness, weakness.

## 2018-12-14 ENCOUNTER — Emergency Department (HOSPITAL_COMMUNITY)
Admission: EM | Admit: 2018-12-14 | Discharge: 2018-12-14 | Disposition: A | Discharge status: Home / Self Care | Payer: 59 | Attending: Emergency Medicine | Admitting: Emergency Medicine

## 2018-12-14 ENCOUNTER — Emergency Department (HOSPITAL_COMMUNITY): Payer: 59

## 2018-12-14 ENCOUNTER — Encounter (HOSPITAL_COMMUNITY): Payer: Self-pay

## 2018-12-14 DIAGNOSIS — J45909 Unspecified asthma, uncomplicated: Secondary | ICD-10-CM | POA: Diagnosis not present

## 2018-12-14 DIAGNOSIS — Z79899 Other long term (current) drug therapy: Secondary | ICD-10-CM | POA: Insufficient documentation

## 2018-12-14 DIAGNOSIS — R0602 Shortness of breath: Secondary | ICD-10-CM | POA: Diagnosis not present

## 2018-12-14 DIAGNOSIS — R1013 Epigastric pain: Secondary | ICD-10-CM | POA: Diagnosis present

## 2018-12-14 DIAGNOSIS — K2100 Gastro-esophageal reflux disease with esophagitis, without bleeding: Secondary | ICD-10-CM

## 2018-12-14 DIAGNOSIS — I10 Essential (primary) hypertension: Secondary | ICD-10-CM | POA: Diagnosis not present

## 2018-12-14 LAB — CBC WITH DIFFERENTIAL/PLATELET
Abs Immature Granulocytes: 0.02 10*3/uL (ref 0.00–0.07)
Basophils Absolute: 0.1 10*3/uL (ref 0.0–0.1)
Basophils Relative: 1 %
Eosinophils Absolute: 0.4 10*3/uL (ref 0.0–0.5)
Eosinophils Relative: 5 %
HCT: 41.8 % (ref 36.0–46.0)
Hemoglobin: 13.8 g/dL (ref 12.0–15.0)
Immature Granulocytes: 0 %
Lymphocytes Relative: 26 %
Lymphs Abs: 1.9 10*3/uL (ref 0.7–4.0)
MCH: 29.8 pg (ref 26.0–34.0)
MCHC: 33 g/dL (ref 30.0–36.0)
MCV: 90.3 fL (ref 80.0–100.0)
Monocytes Absolute: 0.5 10*3/uL (ref 0.1–1.0)
Monocytes Relative: 6 %
Neutro Abs: 4.5 10*3/uL (ref 1.7–7.7)
Neutrophils Relative %: 62 %
Platelets: 234 10*3/uL (ref 150–400)
RBC: 4.63 MIL/uL (ref 3.87–5.11)
RDW: 11.9 % (ref 11.5–15.5)
WBC: 7.2 10*3/uL (ref 4.0–10.5)
nRBC: 0 % (ref 0.0–0.2)

## 2018-12-14 LAB — I-STAT TROPONIN, ED
Troponin i, poc: 0 ng/mL (ref 0.00–0.08)
Troponin i, poc: 0 ng/mL (ref 0.00–0.08)

## 2018-12-14 LAB — COMPREHENSIVE METABOLIC PANEL
ALT: 53 U/L — ABNORMAL HIGH (ref 0–44)
AST: 42 U/L — ABNORMAL HIGH (ref 15–41)
Albumin: 3.6 g/dL (ref 3.5–5.0)
Alkaline Phosphatase: 96 U/L (ref 38–126)
Anion gap: 10 (ref 5–15)
BUN: 11 mg/dL (ref 6–20)
CO2: 24 mmol/L (ref 22–32)
Calcium: 9.3 mg/dL (ref 8.9–10.3)
Chloride: 106 mmol/L (ref 98–111)
Creatinine, Ser: 0.64 mg/dL (ref 0.44–1.00)
GFR calc Af Amer: >60 mL/min (ref >60)
GFR calc non Af Amer: >60 mL/min (ref >60)
Glucose, Bld: 85 mg/dL (ref 70–99)
Potassium: 3.8 mmol/L (ref 3.5–5.1)
Sodium: 140 mmol/L (ref 135–145)
Total Bilirubin: 1.2 mg/dL (ref 0.3–1.2)
Total Protein: 6.9 g/dL (ref 6.5–8.1)

## 2018-12-14 LAB — LIPASE, BLOOD: Lipase: 26 U/L (ref 11–51)

## 2018-12-14 MED ORDER — IOPAMIDOL (ISOVUE-370) INJECTION 76%
100.0000 mL | Freq: Once | INTRAVENOUS | Status: AC | PRN
  Administered 2018-12-14: 100 mL via INTRAVENOUS

## 2018-12-14 MED ORDER — ALUM & MAG HYDROXIDE-SIMETH 200-200-20 MG/5ML PO SUSP
30.0000 mL | Freq: Once | ORAL | Status: AC
  Administered 2018-12-14: 30 mL via ORAL
  Filled 2018-12-14: qty 30

## 2018-12-14 MED ORDER — FAMOTIDINE 20 MG PO TABS
40.0000 mg | ORAL_TABLET | Freq: Once | ORAL | Status: AC
  Administered 2018-12-14: 40 mg via ORAL
  Filled 2018-12-14: qty 2

## 2018-12-14 MED ORDER — HYOSCYAMINE SULFATE 0.125 MG SL SUBL
0.2500 mg | SUBLINGUAL_TABLET | Freq: Once | SUBLINGUAL | Status: AC
  Administered 2018-12-14: 0.25 mg via SUBLINGUAL
  Filled 2018-12-14: qty 2

## 2018-12-14 MED ORDER — PANTOPRAZOLE SODIUM 20 MG PO TBEC: 20.0000 mg | DELAYED_RELEASE_TABLET | Freq: Every day | ORAL | 0 refills | Status: DC

## 2018-12-14 MED ORDER — SUCRALFATE 1 G PO TABS: 1.0000 g | ORAL_TABLET | Freq: Four times a day (QID) | ORAL | 0 refills | Status: DC

## 2018-12-14 MED ORDER — MORPHINE SULFATE (PF) 4 MG/ML IV SOLN
4.0000 mg | Freq: Once | INTRAVENOUS | Status: AC
  Administered 2018-12-14: 4 mg via INTRAVENOUS
  Filled 2018-12-14: qty 1

## 2018-12-14 MED ORDER — PANTOPRAZOLE SODIUM 40 MG IV SOLR
40.0000 mg | Freq: Once | INTRAVENOUS | Status: AC
  Administered 2018-12-14: 40 mg via INTRAVENOUS
  Filled 2018-12-14: qty 40

## 2018-12-14 MED ORDER — SUCRALFATE 1 G PO TABS
1.0000 g | ORAL_TABLET | Freq: Once | ORAL | Status: AC
  Administered 2018-12-14: 1 g via ORAL
  Filled 2018-12-14: qty 1

## 2018-12-14 MED ORDER — SODIUM CHLORIDE 0.9 % IV SOLN
INTRAVENOUS | Status: DC
  Administered 2018-12-14: 10:00:00 via INTRAVENOUS

## 2018-12-14 NOTE — ED Notes (Signed)
Patient verbalizes understanding of discharge instructions. Opportunity for questioning and answers were provided. Pt discharged from ED. 

## 2018-12-14 NOTE — ED Provider Notes (Signed)
[facility]ER[facility]ENC[facility] DEPARTMENT Provider Note   CSN: [id number] Arrival date & time: 12/14/18  0915     History   Chief Complaint Chief Complaint  Patient presents with  . Chest Pain    HPI Samantha Roberson is a 57 y.o. female.  57 year old female presents with acute onset of epigastric pain that radiates to her left upper quadrant that began approximately half hours prior to arrival.  Feels like a burning sensation and is not been associated with dyspnea, diaphoresis, nausea or vomiting.  Pain is nonradiating.  Has been drinking ginger ale and burping which she says is not really helps.  Does have a very strong family history of coronary artery disease and is concerned about that.  Denies any cough or congestion.  No orthopnea or dyspnea exertion.  No treatment use prior to arrival     Past Medical History:  Diagnosis Date  . Asthma   . Hypertension   . Leg pain   . Sinusitis   . Tonsillitis     Patient Active Problem List   Diagnosis Date Noted  . Varicose veins of lower extremities with other complications 24/08/7352    Past Surgical History:  Procedure Laterality Date  . ABDOMINAL HYSTERECTOMY    . ANAL FISSURECTOMY  1999  . ENDOSCOPIC VEIN LASER TREATMENT       OB History   No obstetric history on file.      Home Medications    Prior to Admission medications   Medication Sig Start Date End Date Taking? Authorizing Provider  albuterol (PROAIR HFA) 108 (90 BASE) MCG/ACT inhaler Inhale 2 puffs into the lungs every 6 (six) hours as needed.    [provider]  amoxicillin-clavulanate (AUGMENTIN) 875-125 MG per tablet  01/14/12   [provider]  aspirin 81 MG tablet Take 81 mg by mouth daily.    [provider]  azithromycin (ZITHROMAX Z-PAK) 250 MG tablet 2 po day one, then 1 daily x 4 days 12/06/12   Palumbo, April, MD  Ciprofloxacin (CIPRO PO) Take by mouth.    [provider]  hydrochlorothiazide  (HYDRODIURIL) 25 MG tablet Take 25 mg by mouth daily. Take two (25 mg) tablets daily.    [provider]  HYDROcodone-acetaminophen (NORCO/VICODIN) 5-325 MG tablet Take 1 tablet by mouth every 6 (six) hours as needed for moderate pain. 04/14/17   Lawyer, Harrell Gave, PA-C  ibuprofen (ADVIL,MOTRIN) 800 MG tablet Take 1 tablet (800 mg total) by mouth every 8 (eight) hours as needed. 04/14/17   Lawyer, Harrell Gave, PA-C  metoprolol succinate (TOPROL-XL) 25 MG 24 hr tablet Take 25 mg by mouth daily.    [provider]  Multiple Vitamin (MULTIVITAMIN) capsule Take 1 capsule by mouth daily.    [provider]  ondansetron (ZOFRAN ODT) 4 MG disintegrating tablet Take 1 tablet (4 mg total) by mouth every 8 (eight) hours as needed for nausea. 12/02/15   Larene Pickett, PA-C  oxyCODONE-acetaminophen (PERCOCET/ROXICET) 5-325 MG tablet Take 1 tablet by mouth every 4 (four) hours as needed. 12/02/15   Larene Pickett, PA-C  predniSONE (DELTASONE) 20 MG tablet 3 tabs po day one, then 2 po daily x 4 days 12/06/12   Randal Buba, April, MD  sulfamethoxazole-trimethoprim (BACTRIM DS,SEPTRA DS) 800-160 MG tablet Take 1 tablet by mouth 2 (two) times daily.    [provider]    Family History Family History  Problem Relation Age of Onset  . Stroke Mother   . Hypertension  Mother   . Cancer Father   . Hypertension Father   . Hyperlipidemia Father   . Hypertension Sister     Social History Social History   Tobacco Use  . Smoking status: Never Smoker  . Smokeless tobacco: Never Used  Substance Use Topics  . Alcohol use: Yes    Alcohol/week: 4.0 standard drinks    Types: 4 Standard drinks or equivalent per week    Comment: wine  . Drug use: No     Allergies   Benadryl [diphenhydramine hcl]   Review of Systems Review of Systems  All other systems reviewed and are negative.    Physical Exam Updated Vital Signs BP (!) 159/89   Pulse 100   Temp 98.2 F (36.8 C)  (Oral)   Resp 16   Ht 1.753 m (5\' 9" )   Wt 101.2 kg   SpO2 99%   BMI 32.93 kg/m   Physical Exam Vitals signs and nursing note reviewed.  Constitutional:      General: She is not in acute distress.    Appearance: Normal appearance. She is well-developed. She is not toxic-appearing.  HENT:     Head: Normocephalic and atraumatic.  Eyes:     General: Lids are normal.     Conjunctiva/sclera: Conjunctivae normal.     Pupils: Pupils are equal, round, and reactive to light.  Neck:     Musculoskeletal: Normal range of motion and neck supple.     Thyroid: No thyroid mass.     Trachea: No tracheal deviation.  Cardiovascular:     Rate and Rhythm: Normal rate and regular rhythm.     Heart sounds: Normal heart sounds. No murmur. No gallop.   Pulmonary:     Effort: Pulmonary effort is normal. No respiratory distress.     Breath sounds: Normal breath sounds. No stridor. No decreased breath sounds, wheezing, rhonchi or rales.  Abdominal:     General: Bowel sounds are normal. There is no distension.     Palpations: Abdomen is soft.     Tenderness: There is no abdominal tenderness. There is no rebound.  Musculoskeletal: Normal range of motion.        General: No tenderness.  Skin:    General: Skin is warm and dry.     Findings: No abrasion or rash.  Neurological:     Mental Status: She is alert and oriented to person, place, and time.     GCS: GCS eye subscore is 4. GCS verbal subscore is 5. GCS motor subscore is 6.     Cranial Nerves: No cranial nerve deficit.     Sensory: No sensory deficit.  Psychiatric:        Speech: Speech normal.        Behavior: Behavior normal.      ED Treatments / Results  Labs (all labs ordered are listed, but only abnormal results are displayed) Labs Reviewed  CBC WITH DIFFERENTIAL/PLATELET  COMPREHENSIVE METABOLIC PANEL  LIPASE, BLOOD  I-STAT TROPONIN, ED    EKG EKG Interpretation  Date/Time:  Sunday December 14 2018 09:25:30 EST Ventricular  Rate:  93 PR Interval:    QRS Duration: 92 QT Interval:  365 QTC Calculation: 081 R Axis:   58 Text Interpretation:  Sinus rhythm Baseline wander in lead(s) I II aVR aVL V4 V5 Confirmed by Lacretia Leigh ([id number]) on 12/14/2018 9:29:42 AM   Radiology No results found.  Procedures Procedures (including critical care time)  Medications Ordered in ED Medications  0.9 %  sodium chloride infusion (has no administration in time range)  alum & mag hydroxide-simeth (MAALOX/MYLANTA) 200-200-20 MG/5ML suspension 30 mL (has no administration in time range)  hyoscyamine (LEVSIN SL) SL tablet 0.25 mg (has no administration in time range)  famotidine (PEPCID) tablet 40 mg (has no administration in time range)     Initial Impression / Assessment and Plan / ED Course  I have reviewed the triage vital signs and the nursing notes.  Pertinent labs & imaging results that were available during my care of the patient were reviewed by me and considered in my medical decision making (see chart for details).     Patient's delta troponin negative.  Chest x-ray negative as well.  EKG without acute ischemic changes.  Suspect that patient with GI etiology of her symptoms and was treated for this.  She feels much better at this time.  Will place on PPI as well as Carafate  Final Clinical Impressions(s) / ED Diagnoses   Final diagnoses:  SOB (shortness of breath)    ED Discharge Orders    None       Lacretia Leigh, MD 12/14/18 1525

## 2018-12-14 NOTE — ED Triage Notes (Signed)
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

## 2019-07-01 ENCOUNTER — Other Ambulatory Visit: Payer: Self-pay

## 2019-07-01 DIAGNOSIS — Z20822 Contact with and (suspected) exposure to covid-19: Secondary | ICD-10-CM

## 2019-07-05 LAB — NOVEL CORONAVIRUS, NAA: SARS-CoV-2, NAA: NOT DETECTED

## 2019-07-10 ENCOUNTER — Telehealth: Payer: Self-pay | Admitting: Family Medicine

## 2019-07-10 NOTE — Telephone Encounter (Signed)
Patient is returning call for her negative COVID test results. Patient expressed understanding. Thank you

## 2019-12-13 ENCOUNTER — Encounter (HOSPITAL_BASED_OUTPATIENT_CLINIC_OR_DEPARTMENT_OTHER): Payer: Self-pay | Admitting: Emergency Medicine

## 2019-12-13 ENCOUNTER — Emergency Department (HOSPITAL_BASED_OUTPATIENT_CLINIC_OR_DEPARTMENT_OTHER): Payer: No Typology Code available for payment source

## 2019-12-13 ENCOUNTER — Other Ambulatory Visit: Payer: Self-pay

## 2019-12-13 ENCOUNTER — Emergency Department (HOSPITAL_BASED_OUTPATIENT_CLINIC_OR_DEPARTMENT_OTHER)
Admission: EM | Admit: 2019-12-13 | Discharge: 2019-12-13 | Disposition: A | Discharge status: Home / Self Care | Payer: No Typology Code available for payment source | Attending: Emergency Medicine | Admitting: Emergency Medicine

## 2019-12-13 DIAGNOSIS — Z79899 Other long term (current) drug therapy: Secondary | ICD-10-CM | POA: Insufficient documentation

## 2019-12-13 DIAGNOSIS — M25512 Pain in left shoulder: Secondary | ICD-10-CM | POA: Diagnosis not present

## 2019-12-13 DIAGNOSIS — J45909 Unspecified asthma, uncomplicated: Secondary | ICD-10-CM | POA: Diagnosis not present

## 2019-12-13 DIAGNOSIS — Z7982 Long term (current) use of aspirin: Secondary | ICD-10-CM | POA: Insufficient documentation

## 2019-12-13 DIAGNOSIS — I10 Essential (primary) hypertension: Secondary | ICD-10-CM | POA: Diagnosis not present

## 2019-12-13 MED ORDER — OXYCODONE HCL 5 MG PO TABS
5.0000 mg | ORAL_TABLET | Freq: Once | ORAL | Status: AC
  Administered 2019-12-13: 5 mg via ORAL
  Filled 2019-12-13: qty 1

## 2019-12-13 MED ORDER — KETOROLAC TROMETHAMINE 15 MG/ML IJ SOLN
15.0000 mg | Freq: Once | INTRAMUSCULAR | Status: AC
  Administered 2019-12-13: 15 mg via INTRAMUSCULAR
  Filled 2019-12-13: qty 1

## 2019-12-13 NOTE — ED Notes (Signed)
Pt verbalized understanding of dc instructions.

## 2019-12-13 NOTE — Discharge Instructions (Signed)
Take 4 over the counter ibuprofen tablets 3 times a day or 2 over-the-counter naproxen tablets twice a day for pain. Also take tylenol 1000mg (2 extra strength) four times a day.   This would probably best cared for by seeing the orthopedic surgeon that had fixed your shoulder in the past.

## 2019-12-13 NOTE — ED Provider Notes (Signed)
[facility] EMERGENCY DEPARTMENT Provider Note   CSN: [id number] Arrival date & time: 12/13/19  1038     History Chief Complaint  Patient presents with  . Arm Pain    Samantha Roberson is a 58 y.o. female.  58 yo F with a chief complaint left shoulder pain.  Has been a chronic issue for her but worsening today.  She has had a history of a rotator cuff surgery.  Has had some chronic pain there but good stability.  She had to lift something heavy at work today felt a pop.  Complaining of pain to the posterior aspect of the shoulder.  Too painful to move.  Denies numbness or tingling to the arm.  Denies trauma.  The history is provided by the patient.  Arm Pain This is a recurrent problem. The current episode started 3 to 5 hours ago. The problem occurs constantly. The problem has not changed since onset.Pertinent negatives include no chest pain, no headaches and no shortness of breath. The symptoms are aggravated by bending and twisting. Nothing relieves the symptoms. She has tried nothing for the symptoms. The treatment provided no relief.       Past Medical History:  Diagnosis Date  . Asthma   . Hypertension   . Leg pain   . Sinusitis   . Tonsillitis     Patient Active Problem List   Diagnosis Date Noted  . Varicose veins of lower extremities with other complications [id number]    Past Surgical History:  Procedure Laterality Date  . ABDOMINAL HYSTERECTOMY    . ANAL FISSURECTOMY  1999  . ENDOSCOPIC VEIN LASER TREATMENT       OB History   No obstetric history on file.     Family History  Problem Relation Age of Onset  . Stroke Mother   . Hypertension Mother   . Cancer Father   . Hypertension Father   . Hyperlipidemia Father   . Hypertension Sister     Social History   Tobacco Use  . Smoking status: Never Smoker  . Smokeless tobacco: Never Used  Substance Use Topics  . Alcohol use: Yes    Alcohol/week: 4.0 standard drinks    Types: 4  Standard drinks or equivalent per week    Comment: wine  . Drug use: No    Home Medications Prior to Admission medications   Medication Sig Start Date End Date Taking? Authorizing Provider  albuterol (PROAIR HFA) 108 (90 BASE) MCG/ACT inhaler Inhale 2 puffs into the lungs every 6 (six) hours as needed.    [provider]  aspirin 81 MG tablet Take 81 mg by mouth daily.    [provider]  furosemide (LASIX) 20 MG tablet Take 20 mg by mouth daily as needed for fluid.    [provider]  HYDROcodone-acetaminophen (NORCO/VICODIN) 5-325 MG tablet Take 1 tablet by mouth every 6 (six) hours as needed for moderate pain. Patient not taking: Reported on 12/14/2018 04/14/17   Dalia Heading, PA-C  Multiple Vitamin (MULTIVITAMIN) capsule Take 1 capsule by mouth daily.    [provider]  oxyCODONE-acetaminophen (PERCOCET/ROXICET) 5-325 MG tablet Take 1 tablet by mouth every 4 (four) hours as needed. Patient not taking: Reported on 12/14/2018 12/02/15   Larene Pickett, PA-C  pantoprazole (PROTONIX) 20 MG tablet Take 1 tablet (20 mg total) by mouth daily. 12/14/18   Lacretia Leigh, MD  sucralfate (CARAFATE) 1 g tablet Take 1 tablet (1 g total) by mouth 4 (four)  times daily. 12/14/18   Lacretia Leigh, MD    Allergies    Benadryl [diphenhydramine hcl] and Grass extracts [gramineae pollens]  Review of Systems   Review of Systems  Constitutional: Negative for chills and fever.  HENT: Negative for congestion and rhinorrhea.   Eyes: Negative for redness and visual disturbance.  Respiratory: Negative for shortness of breath and wheezing.   Cardiovascular: Negative for chest pain and palpitations.  Gastrointestinal: Negative for nausea and vomiting.  Genitourinary: Negative for dysuria and urgency.  Musculoskeletal: Positive for arthralgias. Negative for myalgias.  Skin: Negative for pallor and wound.  Neurological: Negative for dizziness and headaches.     Physical Exam Updated Vital Signs BP (!) 163/105 (BP Location: Right Arm)   Pulse (!) 102   Temp 99 F (37.2 C) (Oral)   Resp 18   Ht 5\' 9"  (1.753 m)   Wt 104.3 kg   SpO2 99%   BMI 33.97 kg/m   Physical Exam Vitals and nursing note reviewed.  Constitutional:      General: She is not in acute distress.    Appearance: She is well-developed. She is not diaphoretic.  HENT:     Head: Normocephalic and atraumatic.  Eyes:     Pupils: Pupils are equal, round, and reactive to light.  Cardiovascular:     Rate and Rhythm: Normal rate and regular rhythm.     Heart sounds: No murmur. No friction rub. No gallop.   Pulmonary:     Effort: Pulmonary effort is normal.     Breath sounds: No wheezing or rales.  Abdominal:     General: There is no distension.     Palpations: Abdomen is soft.     Tenderness: There is no abdominal tenderness.  Musculoskeletal:        General: Tenderness present.     Cervical back: Normal range of motion and neck supple.     Comments: Tender along the attachment of the biceps tendon of the shoulder.  She points to the posterior aspect of the shoulder as the area of most pain.  Unable to assess sits due to pain.  No obvious bony tenderness.  Pulse motor and sensation intact distally.  Skin:    General: Skin is warm and dry.  Neurological:     Mental Status: She is alert and oriented to person, place, and time.  Psychiatric:        Behavior: Behavior normal.     ED Results / Procedures / Treatments   Labs (all labs ordered are listed, but only abnormal results are displayed) Labs Reviewed - No data to display  EKG None  Radiology DG Shoulder Left  Result Date: 12/13/2019 CLINICAL DATA:  Left shoulder pain. EXAM: LEFT SHOULDER - 2+ VIEW COMPARISON:  10/13/2019 FINDINGS: There is no evidence of fracture or dislocation. There is no evidence of arthropathy or other focal bone abnormality. Soft tissues are unremarkable. IMPRESSION: Negative.  Electronically Signed   By: Aletta Edouard M.D.   On: 12/13/2019 11:46    Procedures Procedures (including critical care time)  Medications Ordered in ED Medications  ketorolac (TORADOL) 15 MG/ML injection 15 mg (has no administration in time range)  oxyCODONE (Oxy IR/ROXICODONE) immediate release tablet 5 mg (has no administration in time range)    ED Course  I have reviewed the triage vital signs and the nursing notes.  Pertinent labs & imaging results that were available during my care of the patient were reviewed by me and considered in  my medical decision making (see chart for details).    MDM Rules/Calculators/A&P                      58 yo F with a chief complaint of left shoulder pain.  Started today while she was at work.  Patient has a history of a rotator cuff surgery on that same shoulder.  No fracture on x-rays viewed by me.  Placed in a sling.  Ortho follow-up.  12:35 PM:  I have discussed the diagnosis/risks/treatment options with the patient and believe the pt to be eligible for discharge home to follow-up with Ortho. We also discussed returning to the ED immediately if new or worsening sx occur. We discussed the sx which are most concerning (e.g., sudden worsening pain, fever, inability to tolerate by mouth) that necessitate immediate return. Medications administered to the patient during their visit and any new prescriptions provided to the patient are listed below.  Medications given during this visit Medications  ketorolac (TORADOL) 15 MG/ML injection 15 mg (has no administration in time range)  oxyCODONE (Oxy IR/ROXICODONE) immediate release tablet 5 mg (has no administration in time range)     The patient appears reasonably screen and/or stabilized for discharge and I doubt any other medical condition or other [facility] requiring further screening, evaluation, or treatment in the ED at this time prior to discharge.   Final Clinical Impression(s) / ED  Diagnoses Final diagnoses:  Acute pain of left shoulder    Rx / DC Orders ED Discharge Orders    None       Deno Etienne, DO 12/13/19 1236

## 2019-12-13 NOTE — ED Triage Notes (Addendum)
L shoulder pain that started while moving something at work. Hx of rotator cuff surgery on the same arm. She took tylenol at 930 this am.

## 2020-01-03 IMAGING — DX DG SHOULDER 2+V*L*
2 series · 2 of 2 positions shown · non-contrast
Comparison: 10/13/2019

CLINICAL DATA: Left shoulder pain.

EXAM:
LEFT SHOULDER - 2+ VIEW

[shoulder grashey]
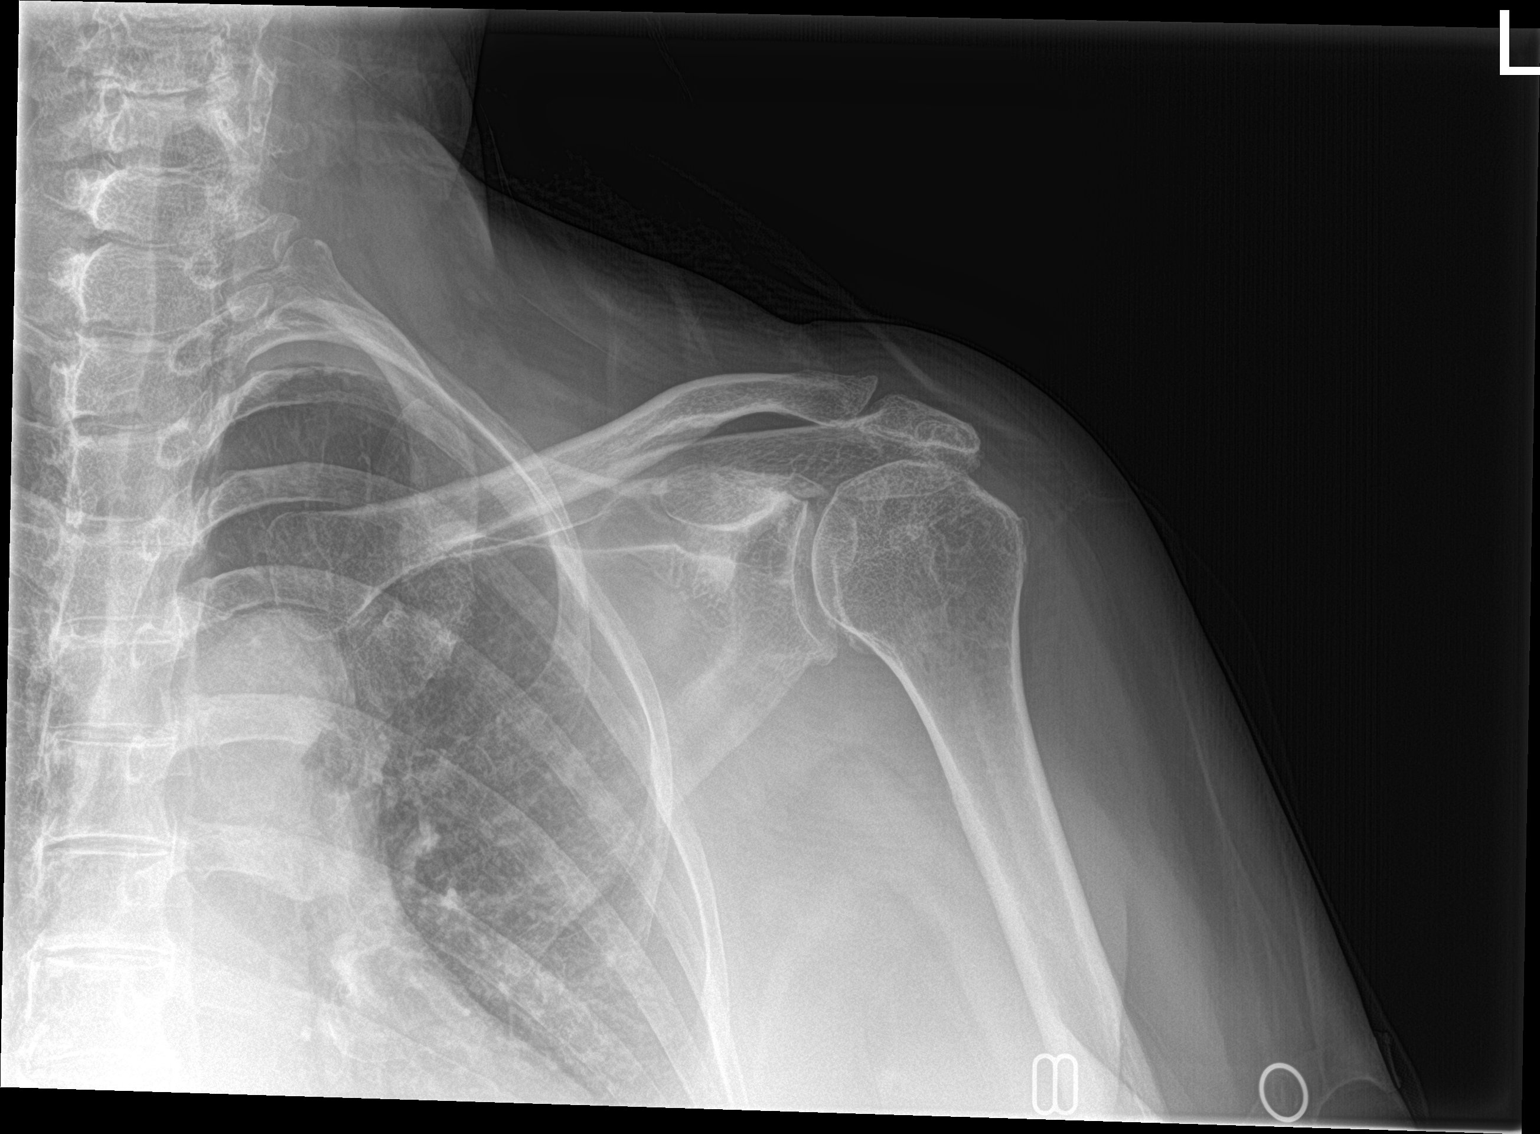

[shoulder y view]
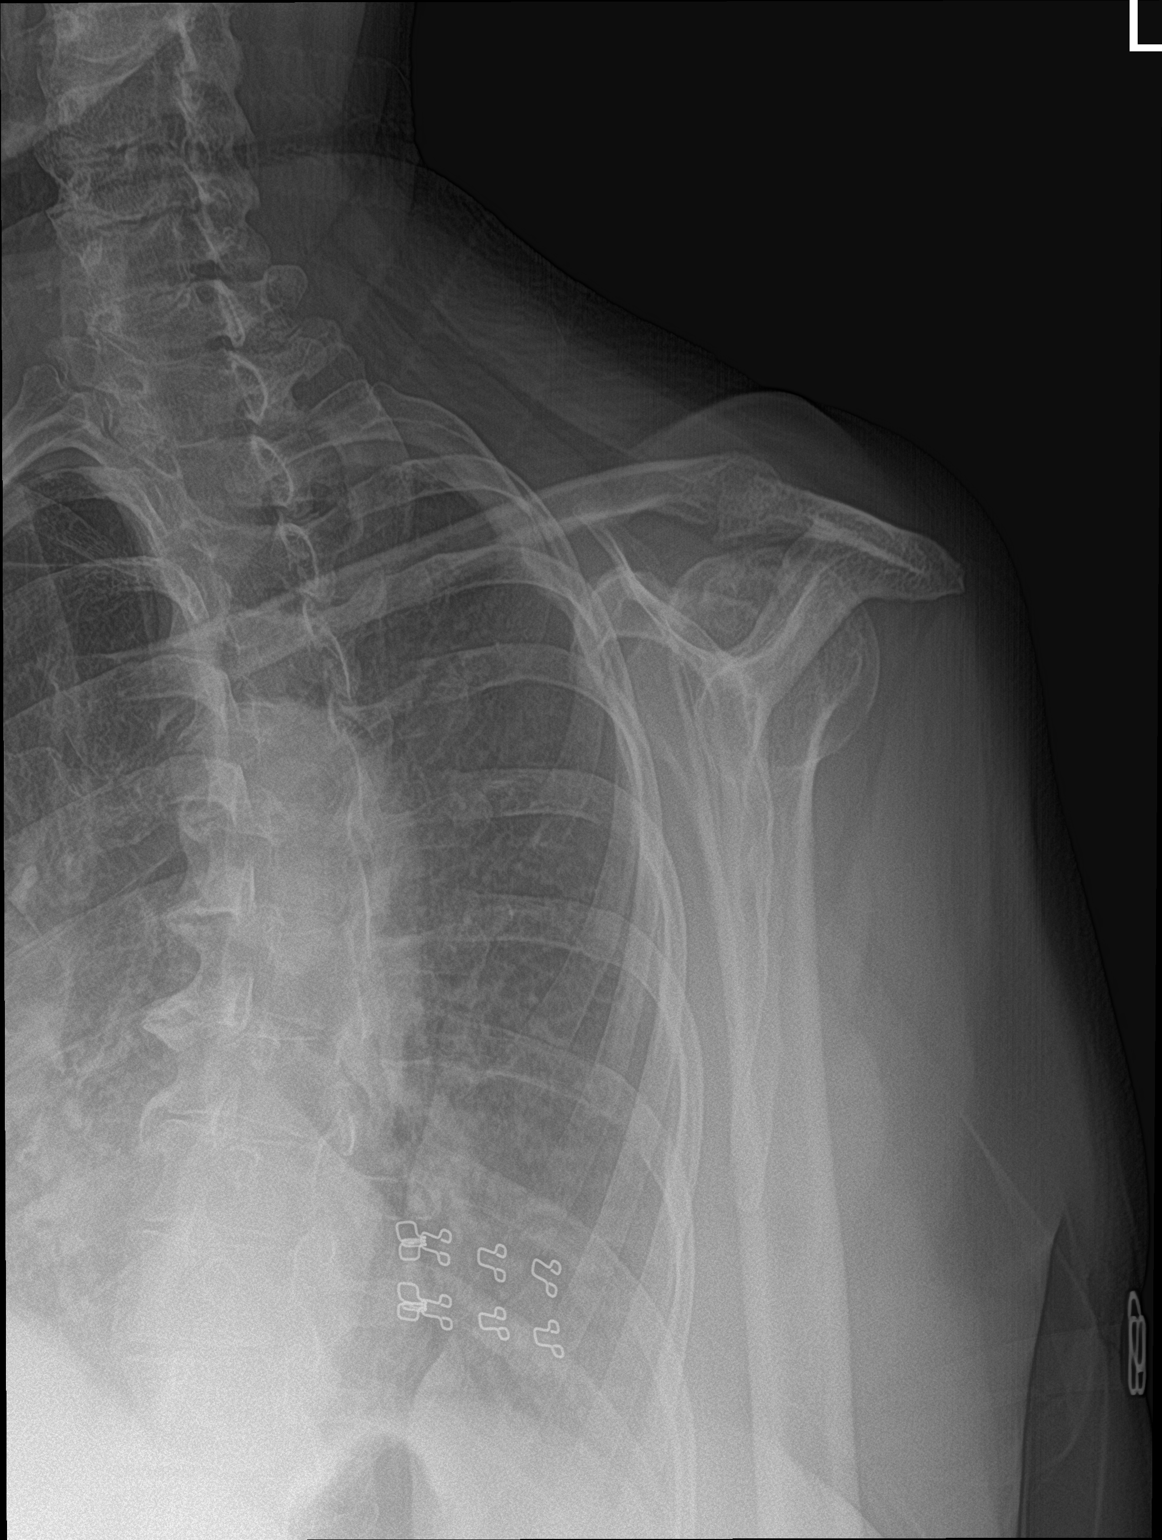

[2 of 2 positions shown; findings below may reference images not displayed]

FINDINGS: There is no evidence of fracture or dislocation. There is no
evidence of arthropathy or other focal bone abnormality. Soft
tissues are unremarkable.
IMPRESSION: Negative.

## 2020-10-18 ENCOUNTER — Encounter (HOSPITAL_BASED_OUTPATIENT_CLINIC_OR_DEPARTMENT_OTHER): Payer: Self-pay | Admitting: Emergency Medicine

## 2020-10-18 ENCOUNTER — Emergency Department (HOSPITAL_BASED_OUTPATIENT_CLINIC_OR_DEPARTMENT_OTHER): Payer: BC Managed Care – PPO

## 2020-10-18 ENCOUNTER — Other Ambulatory Visit: Payer: Self-pay

## 2020-10-18 ENCOUNTER — Emergency Department (HOSPITAL_BASED_OUTPATIENT_CLINIC_OR_DEPARTMENT_OTHER)
Admission: EM | Admit: 2020-10-18 | Discharge: 2020-10-18 | Disposition: A | Discharge status: Home / Self Care | Payer: BC Managed Care – PPO | Attending: Emergency Medicine | Admitting: Emergency Medicine

## 2020-10-18 DIAGNOSIS — J45909 Unspecified asthma, uncomplicated: Secondary | ICD-10-CM | POA: Diagnosis not present

## 2020-10-18 DIAGNOSIS — N2889 Other specified disorders of kidney and ureter: Secondary | ICD-10-CM | POA: Diagnosis not present

## 2020-10-18 DIAGNOSIS — R1013 Epigastric pain: Secondary | ICD-10-CM | POA: Diagnosis present

## 2020-10-18 DIAGNOSIS — I10 Essential (primary) hypertension: Secondary | ICD-10-CM | POA: Diagnosis not present

## 2020-10-18 LAB — COMPREHENSIVE METABOLIC PANEL
ALT: 52 U/L — ABNORMAL HIGH (ref 0–44)
AST: 42 U/L — ABNORMAL HIGH (ref 15–41)
Albumin: 3.8 g/dL (ref 3.5–5.0)
Alkaline Phosphatase: 110 U/L (ref 38–126)
Anion gap: 10 (ref 5–15)
BUN: 11 mg/dL (ref 6–20)
CO2: 26 mmol/L (ref 22–32)
Calcium: 9.9 mg/dL (ref 8.9–10.3)
Chloride: 103 mmol/L (ref 98–111)
Creatinine, Ser: 0.7 mg/dL (ref 0.44–1.00)
GFR, Estimated: >60 mL/min (ref >60)
Glucose, Bld: 117 mg/dL — ABNORMAL HIGH (ref 70–99)
Potassium: 4.2 mmol/L (ref 3.5–5.1)
Sodium: 139 mmol/L (ref 135–145)
Total Bilirubin: 0.9 mg/dL (ref 0.3–1.2)
Total Protein: 7.2 g/dL (ref 6.5–8.1)

## 2020-10-18 LAB — CBC WITH DIFFERENTIAL/PLATELET
Abs Immature Granulocytes: 0.04 10*3/uL (ref 0.00–0.07)
Basophils Absolute: 0.1 10*3/uL (ref 0.0–0.1)
Basophils Relative: 1 %
Eosinophils Absolute: 0.3 10*3/uL (ref 0.0–0.5)
Eosinophils Relative: 4 %
HCT: 42.4 % (ref 36.0–46.0)
Hemoglobin: 14.5 g/dL (ref 12.0–15.0)
Immature Granulocytes: 1 %
Lymphocytes Relative: 26 %
Lymphs Abs: 2.1 10*3/uL (ref 0.7–4.0)
MCH: 30.9 pg (ref 26.0–34.0)
MCHC: 34.2 g/dL (ref 30.0–36.0)
MCV: 90.2 fL (ref 80.0–100.0)
Monocytes Absolute: 0.5 10*3/uL (ref 0.1–1.0)
Monocytes Relative: 6 %
Neutro Abs: 5.1 10*3/uL (ref 1.7–7.7)
Neutrophils Relative %: 62 %
Platelets: 244 10*3/uL (ref 150–400)
RBC: 4.7 MIL/uL (ref 3.87–5.11)
RDW: 12.1 % (ref 11.5–15.5)
WBC: 8.1 10*3/uL (ref 4.0–10.5)
nRBC: 0 % (ref 0.0–0.2)

## 2020-10-18 LAB — LIPASE, BLOOD: Lipase: 32 U/L (ref 11–51)

## 2020-10-18 LAB — TROPONIN I (HIGH SENSITIVITY)
Troponin I (High Sensitivity): 3 ng/L (ref <18)
Troponin I (High Sensitivity): 3 ng/L (ref <18)

## 2020-10-18 MED ORDER — PANTOPRAZOLE SODIUM 40 MG PO TBEC: 40.0000 mg | DELAYED_RELEASE_TABLET | Freq: Every day | ORAL | 0 refills | Status: DC

## 2020-10-18 MED ORDER — MORPHINE SULFATE (PF) 4 MG/ML IV SOLN
4.0000 mg | Freq: Once | INTRAVENOUS | Status: AC
  Administered 2020-10-18: 4 mg via INTRAVENOUS
  Filled 2020-10-18: qty 1

## 2020-10-18 MED ORDER — DICYCLOMINE HCL 20 MG PO TABS: 20.0000 mg | ORAL_TABLET | Freq: Three times a day (TID) | ORAL | 0 refills | Status: DC | PRN

## 2020-10-18 MED ORDER — SODIUM CHLORIDE 0.9 % IV BOLUS
500.0000 mL | Freq: Once | INTRAVENOUS | Status: AC
  Administered 2020-10-18: 500 mL via INTRAVENOUS

## 2020-10-18 MED ORDER — ALUM & MAG HYDROXIDE-SIMETH 400-400-40 MG/5ML PO SUSP: 10.0000 mL | Freq: Four times a day (QID) | ORAL | 0 refills | Status: DC | PRN

## 2020-10-18 MED ORDER — ALUM & MAG HYDROXIDE-SIMETH 200-200-20 MG/5ML PO SUSP
15.0000 mL | Freq: Once | ORAL | Status: AC
  Administered 2020-10-18: 15 mL via ORAL
  Filled 2020-10-18: qty 30

## 2020-10-18 MED ORDER — ONDANSETRON HCL 4 MG/2ML IJ SOLN
4.0000 mg | Freq: Once | INTRAMUSCULAR | Status: AC
  Administered 2020-10-18: 4 mg via INTRAVENOUS
  Filled 2020-10-18: qty 2

## 2020-10-18 MED ORDER — IOHEXOL 300 MG/ML  SOLN
100.0000 mL | Freq: Once | INTRAMUSCULAR | Status: AC | PRN
  Administered 2020-10-18: 100 mL via INTRAVENOUS

## 2020-10-18 MED ORDER — SODIUM CHLORIDE 0.9 % IV SOLN
80.0000 mg | Freq: Once | INTRAVENOUS | Status: AC
  Administered 2020-10-18: 80 mg via INTRAVENOUS
  Filled 2020-10-18 (×2): qty 80

## 2020-10-18 NOTE — ED Notes (Signed)
Pt reports epigastric pain awoke her 3am, took antacid, without improvement

## 2020-10-18 NOTE — Discharge Instructions (Signed)
You were seen in the emergency room today with abdominal pain.  Your work-up today did not show evidence of heart attack or gallbladder disease.  Your CT scan did not show a reason for your pain but did show a mass on your right kidney.  I have discussed this with the urologist and they will need to see you in the next 2 to 4 weeks to further evaluate this mass.  Please call to schedule an appointment.  I have also listed the name of a gastroenterologist.  Please call that office today as well to schedule the next available follow-up appointment.  They may consider endoscopy if your pain continues.  If you experience sudden or worsening pain please return to the emergency department immediately for reevaluation.

## 2020-10-18 NOTE — ED Triage Notes (Signed)
Upper abd pain since 3 am.

## 2020-10-18 NOTE — ED Notes (Signed)
Paged urology Dr. Tresa Moore for consult at [phone]

## 2020-10-18 NOTE — ED Provider Notes (Signed)
Emergency Department Provider Note   I have reviewed the triage vital signs and the nursing notes.   HISTORY  Chief Complaint Abdominal Pain   HPI Samantha Roberson is a 59 y.o. female with PMH of asthma and HTN presents to the emergency department for evaluation of acute onset epigastric abdominal pain starting at 3 AM.  Symptoms woke her from sleep.  Patient denies any vomiting.  She did have a bowel movement thinking that would make her feel better and denies seeing any blood or black in the bowel movement.  No fevers or chills.  Pain does not radiate and is mid epigastric.  She describes it as a burning/pressure pain which is moderate to severe.  She did have a glass of wine last night which is unusual initially thought it was reflux but symptoms progressively worsened.  No radiation of symptoms or other modifying factors.  Past Medical History:  Diagnosis Date  . Asthma   . Hypertension   . Leg pain   . Sinusitis   . Tonsillitis     Patient Active Problem List   Diagnosis Date Noted  . Varicose veins of lower extremities with other complications 50/27/7412    Past Surgical History:  Procedure Laterality Date  . ABDOMINAL HYSTERECTOMY    . ANAL FISSURECTOMY  1999  . ENDOSCOPIC VEIN LASER TREATMENT      Allergies Benadryl [diphenhydramine hcl] and Grass extracts [gramineae pollens]  Family History  Problem Relation Age of Onset  . Stroke Mother   . Hypertension Mother   . Cancer Father   . Hypertension Father   . Hyperlipidemia Father   . Hypertension Sister     Social History Social History   Tobacco Use  . Smoking status: Never Smoker  . Smokeless tobacco: Never Used  Substance Use Topics  . Alcohol use: Yes    Alcohol/week: 4.0 standard drinks    Types: 4 Standard drinks or equivalent per week    Comment: wine  . Drug use: No    Review of Systems  Constitutional: No fever/chills Eyes: No visual changes. ENT: No sore throat. Cardiovascular:  Denies chest pain. Respiratory: Denies shortness of breath. Gastrointestinal: Epigastric abdominal pain.  No nausea, no vomiting.  No diarrhea.  No constipation. Genitourinary: Negative for dysuria. Musculoskeletal: Negative for back pain. Skin: Negative for rash. Neurological: Negative for headaches, focal weakness or numbness.  10-point ROS otherwise negative.  ____________________________________________   PHYSICAL EXAM:  VITAL SIGNS: ED Triage Vitals  Enc Vitals Group     BP 10/18/20 0722 (!) 164/68     Pulse Rate 10/18/20 0722 (!) 106     Resp 10/18/20 0722 20     Temp --      Temp src --      SpO2 10/18/20 0722 99 %     Weight 10/18/20 0719 265 lb (120.2 kg)     Height 10/18/20 0719 5\' 9"  (1.753 m)   Constitutional: Alert and oriented. Well appearing and in no acute distress. Eyes: Conjunctivae are normal. Head: Atraumatic. Nose: No congestion/rhinnorhea. Mouth/Throat: Mucous membranes are moist.  Neck: No stridor.   Cardiovascular: Tachycardia. Good peripheral circulation. Grossly normal heart sounds.   Respiratory: Normal respiratory effort.  No retractions. Lungs CTAB. Gastrointestinal: Soft with mid-epigastric tenderness with voluntary guarding. No rebound. No distention.  Musculoskeletal: No gross deformities of extremities. Neurologic:  Normal speech and language.  Skin:  Skin is warm, dry and intact. No rash noted.  ____________________________________________   LABS (all labs  ordered are listed, but only abnormal results are displayed)  Labs Reviewed  COMPREHENSIVE METABOLIC PANEL - Abnormal; Notable for the following components:      Result Value   Glucose, Bld 117 (*)    AST 42 (*)    ALT 52 (*)    All other components within normal limits  LIPASE, BLOOD  CBC WITH DIFFERENTIAL/PLATELET  TROPONIN I (HIGH SENSITIVITY)  TROPONIN I (HIGH SENSITIVITY)   ____________________________________________  EKG   EKG Interpretation  Date/Time:  Tuesday  October 18 2020 07:25:37 EDT Ventricular Rate:  94 PR Interval:    QRS Duration: 90 QT Interval:  355 QTC Calculation: 444 R Axis:   48 Text Interpretation: Sinus rhythm No STEMI Confirmed by Nanda Quinton [phone]) on 10/18/2020 7:32:45 AM Also confirmed by Nanda Quinton [phone]), editor Hattie Perch ([id number])  on 10/18/2020 12:12:18 PM       ____________________________________________  RADIOLOGY  CT abdomen/pelvis with contrast reviewed.  ____________________________________________   PROCEDURES  Procedure(s) performed:   Procedures  None  ____________________________________________   INITIAL IMPRESSION / ASSESSMENT AND PLAN / ED COURSE  Pertinent labs & imaging results that were available during my care of the patient were reviewed by me and considered in my medical decision making (see chart for details).   Patient presents to the emergency department for evaluation of acute onset mid epigastric abdominal pain which is severe.  She is tachycardic with elevated blood pressure on arrival.  No pain into the chest and EKG shows no acute ischemic change.  Will send a screening troponin but lower suspicion for ACS or PE overall.  Question acute gastritis versus PUD.  Plan for CT to evaluate for evidence of perforated ulcer.  Cholecystitis is on the differential as well but no ultrasound available at this hour.  Spoke with Dr. Tresa Moore with urology.  Advises the patient call the main number to schedule an appointment for the next month.  Placed referral in our system to facilitate follow-up and provided contact information as well as phone number for the patient at discharge.  Labs and imaging reviewed. Discussed symptoms mgmt at home along with PCP, Urology, and GI follow up. Discussed ED return precautions. Patient is tolerating PO and pain has improved at the time of discharge.  ____________________________________________  FINAL CLINICAL IMPRESSION(S) / ED DIAGNOSES  Final  diagnoses:  Epigastric pain  Renal mass     MEDICATIONS GIVEN DURING THIS VISIT:  Medications  pantoprazole (PROTONIX) 80 mg in sodium chloride 0.9 % 100 mL IVPB (0 mg Intravenous Stopped 10/18/20 0851)  morphine 4 MG/ML injection 4 mg (4 mg Intravenous Given 10/18/20 0800)  ondansetron (ZOFRAN) injection 4 mg (4 mg Intravenous Given 10/18/20 0759)  sodium chloride 0.9 % bolus 500 mL (0 mLs Intravenous Stopped 10/18/20 0916)  iohexol (OMNIPAQUE) 300 MG/ML solution 100 mL (100 mLs Intravenous Contrast Given 10/18/20 0822)  alum & mag hydroxide-simeth (MAALOX/MYLANTA) 200-200-20 MG/5ML suspension 15 mL (15 mLs Oral Given 10/18/20 0916)     NEW OUTPATIENT MEDICATIONS STARTED DURING THIS VISIT:  Discharge Medication List as of 10/18/2020 10:37 AM    START taking these medications   Details  alum & mag hydroxide-simeth (MAALOX MAX) 400-400-40 MG/5ML suspension Take 10 mLs by mouth every 6 (six) hours as needed for indigestion., Starting Tue 10/18/2020, Normal    dicyclomine (BENTYL) 20 MG tablet Take 1 tablet (20 mg total) by mouth 3 (three) times daily as needed for spasms (abdominal cramping)., Starting Tue 10/18/2020, Normal  Note:  This document was prepared using Dragon voice recognition software and may include unintentional dictation errors.  Nanda Quinton, MD, [facility] Emergency Medicine    Paisley Grajeda, Wonda [facility], MD 10/21/20 [phone]

## 2020-10-20 ENCOUNTER — Ambulatory Visit (HOSPITAL_COMMUNITY)
Admission: RE | Admit: 2020-10-20 | Discharge: 2020-10-20 | Disposition: A | Discharge status: Home / Self Care | Payer: BC Managed Care – PPO | Source: Ambulatory Visit | Attending: Urology | Admitting: Urology

## 2020-10-20 ENCOUNTER — Encounter (HOSPITAL_COMMUNITY): Payer: Self-pay | Admitting: Urology

## 2020-10-20 ENCOUNTER — Other Ambulatory Visit: Payer: Self-pay

## 2020-10-20 ENCOUNTER — Other Ambulatory Visit (HOSPITAL_COMMUNITY): Payer: Self-pay | Admitting: Urology

## 2020-10-20 DIAGNOSIS — D4101 Neoplasm of uncertain behavior of right kidney: Secondary | ICD-10-CM | POA: Diagnosis present

## 2020-10-25 ENCOUNTER — Other Ambulatory Visit: Payer: Self-pay | Admitting: Urology

## 2020-11-03 ENCOUNTER — Encounter (HOSPITAL_COMMUNITY): Payer: Self-pay

## 2020-11-03 NOTE — Patient Instructions (Addendum)
DUE TO COVID-19 ONLY ONE VISITOR IS ALLOWED TO COME WITH YOU AND STAY IN THE WAITING ROOM ONLY DURING PRE OP AND PROCEDURE DAY OF SURGERY. THE 1 VISITOR  MAY VISIT WITH YOU AFTER SURGERY IN YOUR PRIVATE ROOM DURING VISITING HOURS ONLY!  YOU NEED TO HAVE A COVID 19 TEST ON__11-26-21_____ @_______ , THIS TEST MUST BE DONE BEFORE SURGERY,  COVID TESTING SITE [address] [place] [address], IT IS ON THE RIGHT GOING OUT [address] APPROXIMATELY  2 MINUTES PAST [employer] ON THE RIGHT. ONCE YOUR COVID TEST IS COMPLETED,  PLEASE BEGIN THE QUARANTINE INSTRUCTIONS AS OUTLINED IN YOUR HANDOUT.                Samantha Roberson  11/03/2020   Your procedure is scheduled on: 11-15-20   Report to [facility] Main  Entrance   Report to short stay  at        0530 AM     Call this number if you have problems the morning of surgery [phone]    Remember: Follow a clear liquid diet the day before surgery . You may have clear liquids until 043 am then nothing by mouth           Drink one bottle of magnesium citrate by noon the day before surgery   CLEAR LIQUID DIET   Foods Allowed                                                                                   Foods Excluded  BLACK Coffee and tea, regular and decaf                             liquids that you cannot  Plain Jell-O any favor except red or purple                                           see through such as: Fruit ices (not with fruit pulp)                                                      milk, soups, orange juice  Iced Popsicles                                                     All solid food Carbonated beverages, regular and diet                                    Cranberry, grape and apple juices Sports drinks like Gatorade Lightly seasoned clear broth or consume(fat free) Sugar, honey syrup   _____________________________________________________________________  BRUSH YOUR TEETH  MORNING OF SURGERY AND RINSE YOUR MOUTH OUT, NO CHEWING GUM CANDY OR MINTS.     Take these medicines the morning of surgery with A SIP OF WATER: protonix                               You may not have any metal on your body including hair pins and              piercings  Do not wear jewelry, make-up, lotions, powders or perfumes, deodorant             Do not wear nail polish on your fingernails.  Do not shave  48 hours prior to surgery.              Do not bring valuables to the hospital. [place].  Contacts, dentures or bridgework may not be worn into surgery.      Patients discharged the day of surgery will not be allowed to drive home. IF YOU ARE HAVING SURGERY AND GOING HOME THE SAME DAY, YOU MUST HAVE AN ADULT TO DRIVE YOU HOME AND BE WITH YOU FOR 24 HOURS. YOU MAY GO HOME BY TAXI OR UBER OR ORTHERWISE, BUT AN ADULT MUST ACCOMPANY YOU HOME AND STAY WITH YOU FOR 24 HOURS.  Name and phone number of your driver:  Special Instructions: N/A              Please read over the following fact sheets you were given: _____________________________________________________________________             [facility] - Preparing for Surgery Before surgery, you can play an important role.  Because skin is not sterile, your skin needs to be as free of germs as possible.  You can reduce the number of germs on your skin by washing with CHG (chlorahexidine gluconate) soap before surgery.  CHG is an antiseptic cleaner which kills germs and bonds with the skin to continue killing germs even after washing. Please DO NOT use if you have an allergy to CHG or antibacterial soaps.  If your skin becomes reddened/irritated stop using the CHG and inform your nurse when you arrive at Short Stay. Do not shave (including legs and underarms) for at least 48 hours prior to the first CHG shower.  You may shave your face/neck. Please follow these instructions  carefully:  1.  Shower with CHG Soap the night before surgery and the  morning of Surgery.  2.  If you choose to wash your hair, wash your hair first as usual with your  normal  shampoo.  3.  After you shampoo, rinse your hair and body thoroughly to remove the  shampoo.                           4.  Use CHG as you would any other liquid soap.  You can apply chg directly  to the skin and wash                       Gently with a scrungie or clean washcloth.  5.  Apply the CHG Soap to your body ONLY FROM THE NECK DOWN.   Do not use on face/ open  Wound or open sores. Avoid contact with eyes, ears mouth and genitals (private parts).                       Wash face,  Genitals (private parts) with your normal soap.             6.  Wash thoroughly, paying special attention to the area where your surgery  will be performed.  7.  Thoroughly rinse your body with warm water from the neck down.  8.  DO NOT shower/wash with your normal soap after using and rinsing off  the CHG Soap.                9.  Pat yourself dry with a clean towel.            10.  Wear clean pajamas.            11.  Place clean sheets on your bed the night of your first shower and do not  sleep with pets. Day of Surgery : Do not apply any lotions/deodorants the morning of surgery.  Please wear clean clothes to the hospital/surgery center.  FAILURE TO FOLLOW THESE INSTRUCTIONS MAY RESULT IN THE CANCELLATION OF YOUR SURGERY PATIENT SIGNATURE_________________________________  NURSE SIGNATURE__________________________________  ________________________________________________________________________

## 2020-11-03 NOTE — Progress Notes (Addendum)
PCP - Glendon Axe, MD Cardiologist - Romelle Starcher had a stress test 5 years ago no longer sees cardiologist  PPM/ICD -  Device Orders -  Rep Notified -   Chest x-ray - 10-21-20 epic EKG - 10-18-20 epic Stress Test -  ECHO -  Cardiac Cath -   Sleep Study -  CPAP -   Fasting Blood Sugar -  Checks Blood Sugar _____ times a day  Blood Thinner Instructions: Aspirin Instructions: 81 mg stopped 11-06-20  ERAS Protcol - PRE-SURGERY Ensure or G2-   COVID TEST- 11-26 Fully vaccinate pfizer  ACTIVITY-- Can walk a flight of stairs without SOB or Cp, does own housework Anesthesia review:   Patient denies shortness of breath, fever, cough and chest pain at PAT appointment   All instructions explained to the patient, with a verbal understanding of the material. Patient agrees to go over the instructions while at home for a better understanding. Patient also instructed to self quarantine after being tested for COVID-19. The opportunity to ask questions was provided.

## 2020-11-07 ENCOUNTER — Encounter (HOSPITAL_COMMUNITY): Payer: Self-pay

## 2020-11-07 ENCOUNTER — Other Ambulatory Visit: Payer: Self-pay

## 2020-11-07 ENCOUNTER — Encounter (HOSPITAL_COMMUNITY)
Admission: RE | Admit: 2020-11-07 | Discharge: 2020-11-07 | Disposition: A | Discharge status: Home / Self Care | Payer: BC Managed Care – PPO | Source: Ambulatory Visit | Attending: Urology | Admitting: Urology

## 2020-11-07 DIAGNOSIS — Z01812 Encounter for preprocedural laboratory examination: Secondary | ICD-10-CM | POA: Insufficient documentation

## 2020-11-07 HISTORY — DX: Unspecified asthma, uncomplicated: J45.909

## 2020-11-07 HISTORY — DX: Nontoxic single thyroid nodule: E04.1

## 2020-11-07 LAB — CBC
HCT: 45.4 % (ref 36.0–46.0)
Hemoglobin: 15.3 g/dL — ABNORMAL HIGH (ref 12.0–15.0)
MCH: 30.9 pg (ref 26.0–34.0)
MCHC: 33.7 g/dL (ref 30.0–36.0)
MCV: 91.7 fL (ref 80.0–100.0)
Platelets: 250 10*3/uL (ref 150–400)
RBC: 4.95 MIL/uL (ref 3.87–5.11)
RDW: 12 % (ref 11.5–15.5)
WBC: 6.5 10*3/uL (ref 4.0–10.5)
nRBC: 0 % (ref 0.0–0.2)

## 2020-11-07 LAB — BASIC METABOLIC PANEL
Anion gap: 10 (ref 5–15)
BUN: 11 mg/dL (ref 6–20)
CO2: 26 mmol/L (ref 22–32)
Calcium: 9.5 mg/dL (ref 8.9–10.3)
Chloride: 104 mmol/L (ref 98–111)
Creatinine, Ser: 0.62 mg/dL (ref 0.44–1.00)
GFR, Estimated: >60 mL/min (ref >60)
Glucose, Bld: 107 mg/dL — ABNORMAL HIGH (ref 70–99)
Potassium: 4.2 mmol/L (ref 3.5–5.1)
Sodium: 140 mmol/L (ref 135–145)

## 2020-11-11 ENCOUNTER — Other Ambulatory Visit (HOSPITAL_COMMUNITY)
Admission: RE | Admit: 2020-11-11 | Discharge: 2020-11-11 | Disposition: A | Discharge status: Home / Self Care | Payer: BC Managed Care – PPO | Source: Ambulatory Visit | Attending: Urology | Admitting: Urology

## 2020-11-11 DIAGNOSIS — Z01812 Encounter for preprocedural laboratory examination: Secondary | ICD-10-CM | POA: Diagnosis present

## 2020-11-11 DIAGNOSIS — Z20822 Contact with and (suspected) exposure to covid-19: Secondary | ICD-10-CM | POA: Insufficient documentation

## 2020-11-11 LAB — SARS CORONAVIRUS 2 (TAT 6-24 HRS): SARS Coronavirus 2: NEGATIVE

## 2020-11-14 NOTE — Anesthesia Preprocedure Evaluation (Addendum)
Anesthesia Evaluation  Patient identified by MRN, date of birth, ID band Patient awake    Reviewed: Allergy & Precautions, NPO status , Patient's Chart, lab work & pertinent test results  Airway Mallampati: II  TM Distance: >3 FB Neck ROM: Full    Dental  (+) Dental Advisory Given   Pulmonary asthma ,    breath sounds clear to auscultation       Cardiovascular hypertension, Pt. on medications  Rhythm:Regular Rate:Normal     Neuro/Psych negative neurological ROS     GI/Hepatic negative GI ROS, Neg liver ROS,   Endo/Other  negative endocrine ROS  Renal/GU negative Renal ROS     Musculoskeletal   Abdominal   Peds  Hematology negative hematology ROS (+)   Anesthesia Other Findings   Reproductive/Obstetrics                            Lab Results  Component Value Date   WBC 6.5 11/07/2020   HGB 15.3 (H) 11/07/2020   HCT 45.4 11/07/2020   MCV 91.7 11/07/2020   PLT 250 11/07/2020   Lab Results  Component Value Date   CREATININE 0.62 11/07/2020   BUN 11 11/07/2020   NA 140 11/07/2020   K 4.2 11/07/2020   CL 104 11/07/2020   CO2 26 11/07/2020    Anesthesia Physical Anesthesia Plan  ASA: II  Anesthesia Plan: General   Post-op Pain Management:    Induction: Intravenous  PONV Risk Score and Plan: 3 and Dexamethasone, Ondansetron, Midazolam and Treatment may vary due to age or medical condition  Airway Management Planned: Oral ETT  Additional Equipment: None  Intra-op Plan:   Post-operative Plan: Extubation in OR  Informed Consent: I have reviewed the patients History and Physical, chart, labs and discussed the procedure including the risks, benefits and alternatives for the proposed anesthesia with the patient or authorized representative who has indicated his/her understanding and acceptance.     Dental advisory given  Plan Discussed with: CRNA  Anesthesia Plan  Comments: (2IV's. GETA. ERAS. T&S)      Anesthesia Quick Evaluation

## 2020-11-14 NOTE — H&P (Signed)
CC/HPI: cc: renal mass   10/25/20: 59 year old woman referred by Dr. Jeffie Pollock for a right enhancing renal mass. Patient had a CT scan that showed a 6x5x4 cm right renal mass concerning for RCC. This was present on a CT scan in 2019 but at that time was 3.5 cm. Lesion was not present in 2016. Patient had a chest x-ray done that was normal. Patient denies any gross hematuria or flank pain. She has had prior pelvic surgery with hysterectomy.     ALLERGIES: Benadryl - Anaphylaxis    MEDICATIONS: Aspirin 81 mg tablet,chewable  Pantoprazole Sodium  Tylenol  Zofran     GU PSH: Hysterectomy     NON-GU PSH: Shoulder Arthroscopy/surgery, Right Shoulder Arthroscopy/surgery, Left Tonsillectomy     GU PMH: Right uncertain neoplasm of kidney, She has a right renal mass that has enlarged considerable since 2019 and is most consistent with a renal cell carcinoma. There is no evidence of metastases on CT. I will get a CXR to evaluate the lungs and get her set up ASAP to see one of my partners for consideration of nephrectomy. The tumor is too central for a partial nephrectomy. I have reviewed the risks of the surgery. She was made aware that the lesion was present on scanning in 2019 and has enlarged. - 10/20/2020    NON-GU PMH: Bacteriuria, She has mild pyuria and bacteriuria without symptoms. I will get a culture for completeness. - 10/20/2020 Pyuria/other UA findings - 10/20/2020 GERD Hypertension    FAMILY HISTORY: 1 Daughter - Other 1 son - Other Cancer - Father Dementia - Mother Diabetes - Runs in Family Gastric Cancer - Mother Hypertension - Mother Prostate Cancer - Father   SOCIAL HISTORY: Marital Status: Married Preferred Language: English; Race: White Current Smoking Status: Patient has never smoked.   Tobacco Use Assessment Completed: Used Tobacco in last 30 days? Drinks 3 caffeinated drinks per day.    REVIEW OF SYSTEMS:    GU Review Female:   Patient denies frequent urination,  hard to postpone urination, burning /pain with urination, get up at night to urinate, leakage of urine, stream starts and stops, trouble starting your stream, have to strain to urinate, and being pregnant.  Gastrointestinal (Upper):   Patient denies nausea, vomiting, and indigestion/ heartburn.  Gastrointestinal (Lower):   Patient denies diarrhea and constipation.  Constitutional:   Patient denies fever, night sweats, weight loss, and fatigue.  Skin:   Patient denies skin rash/ lesion and itching.  Eyes:   Patient denies blurred vision and double vision.  Ears/ Nose/ Throat:   Patient denies sore throat and sinus problems.  Hematologic/Lymphatic:   Patient denies swollen glands and easy bruising.  Cardiovascular:   Patient denies leg swelling and chest pains.  Respiratory:   Patient denies cough and shortness of breath.  Endocrine:   Patient denies excessive thirst.  Musculoskeletal:   Patient denies back pain and joint pain.  Neurological:   Patient denies headaches and dizziness.  Psychologic:   Patient denies depression and anxiety.   VITAL SIGNS:      10/24/2020 10:16 AM  Weight 243 lb / 110.22 kg  Height 70 in / 177.8 cm  BP 121/87 mmHg  Pulse 130 /min  Temperature 98.6 F / 37 C  BMI 34.9 kg/m   GU PHYSICAL EXAMINATION:    Cervix: S/P Hysterectomy  Uterus: S/P Hysterectomy   MULTI-SYSTEM PHYSICAL EXAMINATION:    Constitutional: Well-nourished. No physical deformities. Normally developed. Good grooming.  Neck: Neck symmetrical,  not swollen. Normal tracheal position.  Respiratory: No labored breathing, no use of accessory muscles.   Cardiovascular: Normal temperature.   Skin: No paleness, no jaundice, no cyanosis. No lesion, no ulcer, no rash.  Neurologic / Psychiatric: Oriented to time, oriented to place, oriented to person. No depression, no anxiety, no agitation.  Gastrointestinal: No mass, no tenderness, no rigidity, non obese abdomen. Suprapubic scar. No upper abdominal  scars.   Eyes: Normal conjunctivae. Normal eyelids.  Ears, Nose, Mouth, and Throat: Left ear no scars, no lesions, no masses. Right ear no scars, no lesions, no masses. Nose no scars, no lesions, no masses. Normal hearing. Normal lips.  Musculoskeletal: Normal gait and station of head and neck.     Complexity of Data:  Records Review:   Previous Patient Records, POC Tool  Urine Test Review:   Urinalysis  X-Ray Review: C.T. Abdomen/Pelvis: Reviewed Films. Reviewed Report. Discussed With Patient.    Notes:                     10/18/2020: BUN 11, creatinine 0.7, creatinine clearance 89   PROCEDURES:          Urinalysis w/Scope - 81001 Dipstick Dipstick Cont'd Micro  Color: Yellow Bilirubin: Neg WBC/hpf: 6 - 10/hpf  Appearance: Slightly Cloudy Ketones: Neg RBC/hpf: 0 - 2/hpf  Specific Gravity: 1.020 Blood: Neg Bacteria: Rare (0-9/hpf)  pH: 7.0 Protein: Trace Cystals: NS (Not Seen)  Glucose: Neg Urobilinogen: 0.2 Casts: NS (Not Seen)    Nitrites: Neg Trichomonas: Not Present    Leukocyte Esterase: 3+ Mucous: Not Present      Epithelial Cells: 0 - 5/hpf      Yeast: NS (Not Seen)      Sperm: Not Present    Notes:  MICROSCOPIC NOT CONCENTRATED     ASSESSMENT:      ICD-10 Details  1 GU:   Right uncertain neoplasm of kidney - D41.01 Undiagnosed New Problem - Discussed patient's CT findings which are concerning for a right renal cell carcinoma. Based on the size and location of the mass I have recommended a right radical nephrectomy. I do not think this tumor is amenable to a partial nephrectomy. I discussed with patient different approaches to right radical nephrectomy including open, hand-assisted laparoscopic and robotic assisted. Risks and benefits of the procedure were discussed with the patient including but not limited to bleeding, infection, pain, need for additional treatment, damage to surrounding structures including but not limited IW:OEHOZ/YYQMG/NOIBB blood vessels/small  intestine/lung and other adjacent structures. Patient wishes to proceed with right hand-assisted right radical nephrectomy. We also did discuss the impact on renal function and that will in tract renal function postoperatively but that her new baseline will likely have decreased renal function.

## 2020-11-15 ENCOUNTER — Other Ambulatory Visit: Payer: Self-pay

## 2020-11-15 ENCOUNTER — Encounter (HOSPITAL_COMMUNITY): Payer: Self-pay | Admitting: Urology

## 2020-11-15 ENCOUNTER — Encounter (HOSPITAL_COMMUNITY)
Admission: RE | Disposition: A | Discharge status: Home / Self Care | Payer: Self-pay | Source: Home / Self Care | Attending: Urology

## 2020-11-15 ENCOUNTER — Inpatient Hospital Stay (HOSPITAL_COMMUNITY): Payer: BC Managed Care – PPO | Admitting: Anesthesiology

## 2020-11-15 ENCOUNTER — Inpatient Hospital Stay (HOSPITAL_COMMUNITY)
Admission: RE | Admit: 2020-11-15 | Discharge: 2020-11-17 | DRG: 658 | Disposition: A | Discharge status: Home / Self Care | Payer: BC Managed Care – PPO | Attending: Urology | Admitting: Urology

## 2020-11-15 DIAGNOSIS — Z7982 Long term (current) use of aspirin: Secondary | ICD-10-CM | POA: Diagnosis not present

## 2020-11-15 DIAGNOSIS — Z800 Family history of malignant neoplasm of digestive organs: Secondary | ICD-10-CM | POA: Diagnosis not present

## 2020-11-15 DIAGNOSIS — Z79899 Other long term (current) drug therapy: Secondary | ICD-10-CM | POA: Diagnosis not present

## 2020-11-15 DIAGNOSIS — N2889 Other specified disorders of kidney and ureter: Secondary | ICD-10-CM | POA: Diagnosis present

## 2020-11-15 DIAGNOSIS — Z888 Allergy status to other drugs, medicaments and biological substances status: Secondary | ICD-10-CM | POA: Diagnosis not present

## 2020-11-15 DIAGNOSIS — C641 Malignant neoplasm of right kidney, except renal pelvis: Secondary | ICD-10-CM | POA: Diagnosis present

## 2020-11-15 DIAGNOSIS — Z90710 Acquired absence of both cervix and uterus: Secondary | ICD-10-CM

## 2020-11-15 DIAGNOSIS — I10 Essential (primary) hypertension: Secondary | ICD-10-CM | POA: Diagnosis present

## 2020-11-15 DIAGNOSIS — Z8249 Family history of ischemic heart disease and other diseases of the circulatory system: Secondary | ICD-10-CM | POA: Diagnosis not present

## 2020-11-15 DIAGNOSIS — Z833 Family history of diabetes mellitus: Secondary | ICD-10-CM

## 2020-11-15 HISTORY — PX: LAPAROSCOPIC NEPHRECTOMY, HAND ASSISTED: SHX1929

## 2020-11-15 LAB — BASIC METABOLIC PANEL
Anion gap: 9 (ref 5–15)
BUN: 11 mg/dL (ref 6–20)
CO2: 25 mmol/L (ref 22–32)
Calcium: 8.8 mg/dL — ABNORMAL LOW (ref 8.9–10.3)
Chloride: 104 mmol/L (ref 98–111)
Creatinine, Ser: 0.8 mg/dL (ref 0.44–1.00)
GFR, Estimated: >60 mL/min (ref >60)
Glucose, Bld: 162 mg/dL — ABNORMAL HIGH (ref 70–99)
Potassium: 3.9 mmol/L (ref 3.5–5.1)
Sodium: 138 mmol/L (ref 135–145)

## 2020-11-15 LAB — TYPE AND SCREEN
ABO/RH(D): O NEG
Antibody Screen: NEGATIVE

## 2020-11-15 LAB — HEMOGLOBIN AND HEMATOCRIT, BLOOD
HCT: 43.9 % (ref 36.0–46.0)
Hemoglobin: 14.6 g/dL (ref 12.0–15.0)

## 2020-11-15 LAB — ABO/RH: ABO/RH(D): O NEG

## 2020-11-15 SURGERY — NEPHRECTOMY, HAND-ASSISTED, LAPAROSCOPIC
Anesthesia: General | Laterality: Right

## 2020-11-15 MED ORDER — ONDANSETRON HCL 4 MG/2ML IJ SOLN
INTRAMUSCULAR | Status: DC | PRN
  Administered 2020-11-15: 4 mg via INTRAVENOUS

## 2020-11-15 MED ORDER — ONDANSETRON HCL 4 MG/2ML IJ SOLN
4.0000 mg | INTRAMUSCULAR | Status: DC | PRN
  Administered 2020-11-16: 4 mg via INTRAVENOUS
  Filled 2020-11-15: qty 2

## 2020-11-15 MED ORDER — BUPIVACAINE LIPOSOME 1.3 % IJ SUSP
20.0000 mL | Freq: Once | INTRAMUSCULAR | Status: AC
  Administered 2020-11-15: 20 mL
  Filled 2020-11-15: qty 20

## 2020-11-15 MED ORDER — ALBUMIN HUMAN 5 % IV SOLN: INTRAVENOUS | Status: DC | PRN

## 2020-11-15 MED ORDER — OXYCODONE HCL 5 MG PO TABS
ORAL_TABLET | ORAL | Status: AC
  Administered 2020-11-15: 5 mg via ORAL
  Filled 2020-11-15: qty 1

## 2020-11-15 MED ORDER — ONDANSETRON HCL 4 MG/2ML IJ SOLN
INTRAMUSCULAR | Status: AC
  Filled 2020-11-15: qty 2

## 2020-11-15 MED ORDER — MIDAZOLAM HCL 2 MG/2ML IJ SOLN
INTRAMUSCULAR | Status: AC
  Filled 2020-11-15: qty 2

## 2020-11-15 MED ORDER — LACTATED RINGERS IV SOLN: INTRAVENOUS | Status: DC

## 2020-11-15 MED ORDER — FENTANYL CITRATE (PF) 100 MCG/2ML IJ SOLN
25.0000 ug | INTRAMUSCULAR | Status: DC | PRN
  Administered 2020-11-15: 50 ug via INTRAVENOUS

## 2020-11-15 MED ORDER — CEFAZOLIN SODIUM-DEXTROSE 2-4 GM/100ML-% IV SOLN
2.0000 g | INTRAVENOUS | Status: AC
  Administered 2020-11-15: 2 g via INTRAVENOUS
  Filled 2020-11-15: qty 100

## 2020-11-15 MED ORDER — FUROSEMIDE 20 MG PO TABS: 20.0000 mg | ORAL_TABLET | Freq: Every day | ORAL | Status: DC | PRN

## 2020-11-15 MED ORDER — PROPOFOL 10 MG/ML IV BOLUS
INTRAVENOUS | Status: DC | PRN
  Administered 2020-11-15: 150 mg via INTRAVENOUS

## 2020-11-15 MED ORDER — DOCUSATE SODIUM 100 MG PO CAPS
100.0000 mg | ORAL_CAPSULE | Freq: Two times a day (BID) | ORAL | Status: DC
  Administered 2020-11-15 – 2020-11-17 (×4): 100 mg via ORAL
  Filled 2020-11-15 (×4): qty 1

## 2020-11-15 MED ORDER — FENTANYL CITRATE (PF) 100 MCG/2ML IJ SOLN
INTRAMUSCULAR | Status: AC
  Filled 2020-11-15: qty 2

## 2020-11-15 MED ORDER — PROPOFOL 10 MG/ML IV BOLUS
INTRAVENOUS | Status: AC
  Filled 2020-11-15: qty 20

## 2020-11-15 MED ORDER — CEFAZOLIN SODIUM-DEXTROSE 2-4 GM/100ML-% IV SOLN
2.0000 g | Freq: Three times a day (TID) | INTRAVENOUS | Status: DC
  Filled 2020-11-15 (×2): qty 100

## 2020-11-15 MED ORDER — PHENYLEPHRINE 40 MCG/ML (10ML) SYRINGE FOR IV PUSH (FOR BLOOD PRESSURE SUPPORT)
PREFILLED_SYRINGE | INTRAVENOUS | Status: AC
  Filled 2020-11-15: qty 10

## 2020-11-15 MED ORDER — OXYCODONE HCL 5 MG PO TABS
5.0000 mg | ORAL_TABLET | ORAL | Status: DC | PRN
  Administered 2020-11-15 – 2020-11-17 (×8): 5 mg via ORAL
  Filled 2020-11-15 (×7): qty 1

## 2020-11-15 MED ORDER — ACETAMINOPHEN 325 MG PO TABS
650.0000 mg | ORAL_TABLET | ORAL | Status: DC | PRN
  Administered 2020-11-15 – 2020-11-17 (×5): 650 mg via ORAL
  Filled 2020-11-15 (×4): qty 2

## 2020-11-15 MED ORDER — ACETAMINOPHEN 500 MG PO TABS
1000.0000 mg | ORAL_TABLET | Freq: Once | ORAL | Status: AC
  Administered 2020-11-15: 1000 mg via ORAL
  Filled 2020-11-15: qty 2

## 2020-11-15 MED ORDER — CHLORHEXIDINE GLUCONATE 0.12 % MT SOLN
15.0000 mL | Freq: Once | OROMUCOSAL | Status: AC
  Administered 2020-11-15: 15 mL via OROMUCOSAL

## 2020-11-15 MED ORDER — GABAPENTIN 300 MG PO CAPS
300.0000 mg | ORAL_CAPSULE | Freq: Once | ORAL | Status: AC
  Administered 2020-11-15: 300 mg via ORAL
  Filled 2020-11-15: qty 1

## 2020-11-15 MED ORDER — SUGAMMADEX SODIUM 200 MG/2ML IV SOLN
INTRAVENOUS | Status: DC | PRN
  Administered 2020-11-15: 200 mg via INTRAVENOUS

## 2020-11-15 MED ORDER — LIDOCAINE 2% (20 MG/ML) 5 ML SYRINGE
INTRAMUSCULAR | Status: DC | PRN
  Administered 2020-11-15: 60 mg via INTRAVENOUS
  Administered 2020-11-15: 1.5 mg/kg/h via INTRAVENOUS

## 2020-11-15 MED ORDER — 0.9 % SODIUM CHLORIDE (POUR BTL) OPTIME
TOPICAL | Status: DC | PRN
  Administered 2020-11-15: 1000 mL

## 2020-11-15 MED ORDER — DEXAMETHASONE SODIUM PHOSPHATE 4 MG/ML IJ SOLN
INTRAMUSCULAR | Status: DC | PRN
  Administered 2020-11-15: 8 mg via INTRAVENOUS

## 2020-11-15 MED ORDER — ORAL CARE MOUTH RINSE: 15.0000 mL | Freq: Once | OROMUCOSAL | Status: AC

## 2020-11-15 MED ORDER — CHLORHEXIDINE GLUCONATE CLOTH 2 % EX PADS
6.0000 | MEDICATED_PAD | Freq: Every day | CUTANEOUS | Status: DC
  Administered 2020-11-16 – 2020-11-17 (×3): 6 via TOPICAL

## 2020-11-15 MED ORDER — FENTANYL CITRATE (PF) 250 MCG/5ML IJ SOLN
INTRAMUSCULAR | Status: DC | PRN
  Administered 2020-11-15 (×2): 100 ug via INTRAVENOUS

## 2020-11-15 MED ORDER — PANTOPRAZOLE SODIUM 40 MG PO TBEC
40.0000 mg | DELAYED_RELEASE_TABLET | Freq: Every day | ORAL | Status: DC
  Administered 2020-11-16 – 2020-11-17 (×2): 40 mg via ORAL
  Filled 2020-11-15 (×2): qty 1

## 2020-11-15 MED ORDER — LACTATED RINGERS IV SOLN
INTRAVENOUS | Status: AC | PRN
  Administered 2020-11-15: 1000 mL via INTRAVENOUS

## 2020-11-15 MED ORDER — MORPHINE SULFATE (PF) 4 MG/ML IV SOLN
2.0000 mg | INTRAVENOUS | Status: DC | PRN
  Administered 2020-11-17: 4 mg via INTRAVENOUS
  Filled 2020-11-15: qty 1

## 2020-11-15 MED ORDER — MIDAZOLAM HCL 5 MG/5ML IJ SOLN
INTRAMUSCULAR | Status: DC | PRN
  Administered 2020-11-15: 2 mg via INTRAVENOUS

## 2020-11-15 MED ORDER — CEFAZOLIN SODIUM-DEXTROSE 2-4 GM/100ML-% IV SOLN
2.0000 g | Freq: Three times a day (TID) | INTRAVENOUS | Status: AC
  Administered 2020-11-15 – 2020-11-16 (×2): 2 g via INTRAVENOUS
  Filled 2020-11-15 (×2): qty 100

## 2020-11-15 MED ORDER — LACTATED RINGERS IV SOLN: INTRAVENOUS | Status: DC | PRN

## 2020-11-15 MED ORDER — ROCURONIUM BROMIDE 10 MG/ML (PF) SYRINGE
PREFILLED_SYRINGE | INTRAVENOUS | Status: DC | PRN
  Administered 2020-11-15: 50 mg via INTRAVENOUS
  Administered 2020-11-15: 40 mg via INTRAVENOUS

## 2020-11-15 MED ORDER — AMISULPRIDE (ANTIEMETIC) 5 MG/2ML IV SOLN: 10.0000 mg | Freq: Once | INTRAVENOUS | Status: DC | PRN

## 2020-11-15 MED ORDER — ACETAMINOPHEN 325 MG PO TABS
ORAL_TABLET | ORAL | Status: AC
  Filled 2020-11-15: qty 2

## 2020-11-15 MED ORDER — LIDOCAINE HCL (PF) 2 % IJ SOLN
INTRAMUSCULAR | Status: AC
  Filled 2020-11-15: qty 5

## 2020-11-15 MED ORDER — PHENYLEPHRINE 40 MCG/ML (10ML) SYRINGE FOR IV PUSH (FOR BLOOD PRESSURE SUPPORT)
PREFILLED_SYRINGE | INTRAVENOUS | Status: DC | PRN
  Administered 2020-11-15: 40 ug via INTRAVENOUS

## 2020-11-15 MED ORDER — SODIUM CHLORIDE 0.9 % IV SOLN: INTRAVENOUS | Status: DC

## 2020-11-15 MED ORDER — SENNA 8.6 MG PO TABS
1.0000 | ORAL_TABLET | Freq: Two times a day (BID) | ORAL | Status: DC
  Administered 2020-11-15 – 2020-11-17 (×4): 8.6 mg via ORAL
  Filled 2020-11-15 (×4): qty 1

## 2020-11-15 MED ORDER — PROPOFOL 1000 MG/100ML IV EMUL
INTRAVENOUS | Status: AC
  Filled 2020-11-15: qty 300

## 2020-11-15 SURGICAL SUPPLY — 73 items
ADH SKN CLS APL DERMABOND .7 (GAUZE/BANDAGES/DRESSINGS) ×1
AGENT HMST KT MTR STRL THRMB (HEMOSTASIS) ×1
APL PRP STRL LF DISP 70% ISPRP (MISCELLANEOUS) ×1
BAG LAPAROSCOPIC 12 15 PORT 16 (BASKET) IMPLANT
BAG RETRIEVAL 12/15 (BASKET)
BAG SPEC RTRVL LRG 6X4 10 (ENDOMECHANICALS)
BAG SPEC THK2 15X12 ZIP CLS (MISCELLANEOUS) ×1
BAG ZIPLOCK 12X15 (MISCELLANEOUS) ×2 IMPLANT
BLADE EXTENDED COATED 6.5IN (ELECTRODE) IMPLANT
BLADE SURG SZ10 CARB STEEL (BLADE) IMPLANT
CABLE HIGH FREQUENCY MONO STRZ (ELECTRODE) ×2 IMPLANT
CHLORAPREP W/TINT 26 (MISCELLANEOUS) ×2 IMPLANT
CLIP VESOLOCK LG 6/CT PURPLE (CLIP) ×2 IMPLANT
CLIP VESOLOCK MED LG 6/CT (CLIP) IMPLANT
CLIP VESOLOCK XL 6/CT (CLIP) IMPLANT
COVER SURGICAL LIGHT HANDLE (MISCELLANEOUS) ×2 IMPLANT
COVER WAND RF STERILE (DRAPES) IMPLANT
CUTTER ECHEON FLEX ENDO 45 340 (ENDOMECHANICALS) ×1 IMPLANT
CUTTER FLEX LINEAR 45M (STAPLE) IMPLANT
DERMABOND ADVANCED (GAUZE/BANDAGES/DRESSINGS) ×1
DERMABOND ADVANCED .7 DNX12 (GAUZE/BANDAGES/DRESSINGS) IMPLANT
DRAIN CHANNEL 10F 3/8 F FF (DRAIN) IMPLANT
DRAIN CHANNEL 15F RND FF 3/16 (WOUND CARE) ×1 IMPLANT
DRAPE INCISE IOBAN 66X45 STRL (DRAPES) ×5 IMPLANT
DRSG OPSITE POSTOP 4X6 (GAUZE/BANDAGES/DRESSINGS) ×2 IMPLANT
DRSG TEGADERM 4X4.75 (GAUZE/BANDAGES/DRESSINGS) IMPLANT
ELECT PENCIL ROCKER SW 15FT (MISCELLANEOUS) ×2 IMPLANT
ELECT REM PT RETURN 15FT ADLT (MISCELLANEOUS) ×2 IMPLANT
EVACUATOR SILICONE 100CC (DRAIN) ×1 IMPLANT
GLOVE BIO SURGEON STRL SZ 6.5 (GLOVE) ×2 IMPLANT
GOWN SRG XL LVL 4 BRTHBL STRL (GOWNS) IMPLANT
GOWN STRL NON-REIN XL LVL4 (GOWNS)
GOWN STRL REUS W/TWL LRG LVL3 (GOWN DISPOSABLE) ×2 IMPLANT
GRASPER SUT TROCAR 14GX15 (MISCELLANEOUS) ×1 IMPLANT
HANDLE STAPLE EGIA 4 XL (STAPLE) IMPLANT
HEMOSTAT SURGICEL 4X8 (HEMOSTASIS) IMPLANT
IRRIG SUCT STRYKERFLOW 2 WTIP (MISCELLANEOUS)
IRRIGATION SUCT STRKRFLW 2 WTP (MISCELLANEOUS) IMPLANT
KIT BASIN OR (CUSTOM PROCEDURE TRAY) ×2 IMPLANT
KIT TURNOVER KIT A (KITS) IMPLANT
LIGASURE VESSEL 5MM BLUNT TIP (ELECTROSURGICAL) IMPLANT
MARKER SKIN DUAL TIP RULER LAB (MISCELLANEOUS) ×2 IMPLANT
POUCH SPECIMEN RETRIEVAL 10MM (ENDOMECHANICALS) IMPLANT
RELOAD 45 VASCULAR/THIN (ENDOMECHANICALS) IMPLANT
RELOAD EGIA 45 TAN VASC (STAPLE) IMPLANT
RELOAD STAPLE 45 2.5 WHT GRN (ENDOMECHANICALS) IMPLANT
RELOAD STAPLE 45 2.6 WHT THIN (STAPLE) IMPLANT
SCISSORS LAP 5X35 DISP (ENDOMECHANICALS) IMPLANT
SET TUBE SMOKE EVAC HIGH FLOW (TUBING) ×2 IMPLANT
SPONGE LAP 18X18 RF (DISPOSABLE) ×2 IMPLANT
SPONGE LAP 4X18 RFD (DISPOSABLE) ×1 IMPLANT
STAPLE RELOAD 45 WHT (STAPLE) ×3 IMPLANT
STAPLE RELOAD 45MM WHITE (STAPLE) ×6
STAPLER VISISTAT 35W (STAPLE) IMPLANT
SURGIFLO W/THROMBIN 8M KIT (HEMOSTASIS) ×1 IMPLANT
SUT ETHILON 2 0 PSLX (SUTURE) ×1 IMPLANT
SUT ETHILON 3 0 PS 1 (SUTURE) IMPLANT
SUT MNCRL AB 4-0 PS2 18 (SUTURE) IMPLANT
SUT PDS AB 1 TP1 96 (SUTURE) ×4 IMPLANT
SUT VIC AB 2-0 SH 27 (SUTURE) ×2
SUT VIC AB 2-0 SH 27X BRD (SUTURE) IMPLANT
SUT VICRYL 0 UR6 27IN ABS (SUTURE) IMPLANT
SYR BULB IRRIG 60ML STRL (SYRINGE) ×1 IMPLANT
SYS LAPSCP GELPORT 120MM (MISCELLANEOUS) ×2
SYSTEM LAPSCP GELPORT 120MM (MISCELLANEOUS) ×1 IMPLANT
TAPE STRIPS DRAPE STRL (GAUZE/BANDAGES/DRESSINGS) ×2 IMPLANT
TOWEL OR 17X26 10 PK STRL BLUE (TOWEL DISPOSABLE) ×2 IMPLANT
TRAY FOLEY MTR SLVR 16FR STAT (SET/KITS/TRAYS/PACK) ×2 IMPLANT
TRAY LAPAROSCOPIC (CUSTOM PROCEDURE TRAY) ×2 IMPLANT
TROCAR BLADELESS OPT 5 100 (ENDOMECHANICALS) IMPLANT
TROCAR UNIVERSAL OPT 12M 100M (ENDOMECHANICALS) ×3 IMPLANT
TROCAR XCEL 12X100 BLDLESS (ENDOMECHANICALS) ×2 IMPLANT
YANKAUER SUCT BULB TIP 10FT TU (MISCELLANEOUS) ×1 IMPLANT

## 2020-11-15 NOTE — Op Note (Signed)
Operative Note  Preoperative diagnosis:  1. Right renal mass  Postoperative diagnosis: 1. Right renal mass  Procedure(s): 1. Right hand assisted laparoscopic radical nephrectomy  Surgeon: Jacalyn Lefevre, MD  Assistants: Windell Norfolk, MD An assistant was needed throughout the case further expertise in assisting with a laparoscopic surgery, including visualization with the camera, passing instruments, etc.  Anesthesia: General  Complications: None  EBL: 50 cc  Specimens: 1. Right kidney  Drains/Catheters: 1. Foley catheter 2. Round subcutaneous port site incision drain  Intraoperative findings:  1. Right kidney removed entirely 2. Poor quality fasica noted at port site incisoins  Indication: 59 year old woman who was found on imaging to have a 6 cm enhancing right renal mass concerning for renal cell carcinoma.  After discussion of different options, the patient elected to undergo the above operation.  Description of procedure:  The patient was identified and consent was obtained.  The patient was taken to the operating room and placed in the supine position.  The patient was placed under general anesthesia.  Perioperative antibiotics were administered.  The patient was placed in right lateral position at approximately 65 degrees and all pressure points were padded.  Patient was prepped and draped in a standard sterile fashion and a timeout was performed.  An 8 cm periumbilical incision was made sharply into the skin.  This was carried down with Bovie electrocautery down to the anterior rectus sheath which was divided with electrocautery.  The underlying musculature was separated in the midline.  Sharp dissection with Metzenbaum scissors was used to open up the posterior sheath and peritoneum.  This was extended with electrocautery taking great care not to use cautery near the bowel.  The hand assist port was secured into the incision.  I made sure no bowel was trapped within this.  A  12 mm port was inserted through the hand assist port and the abdomen was insufflated to a pressure of 15.  A 12 mm port was placed lateral as well as superior to the hand assist port, each 1 about a hand width away. A 12 mm port was placed superior to the midline port and used for liver retraction.  Please note that all ports were placed under direct visualization with the camera.  The colon was first dissected medially by incising along the white line of Toldt.  After medializing the colon, the kidney was dissected laterally and medially as well as superiorly.  Inferior attachments as well as the ureter and gonadal vein were divided with LigaSure device. The ureter was clipped with a clip. I continued to carefully dissect medially and identified the renal hilum.  The renal artery were divided with a 45 mm vascular staple load.  The renal vein was then divided with another 45 mm vascular staple load.  Superior attachments were then released using LigaSure device as well as blunt dissection.  Once the entire kidney and surrounding Gerota's fascia was freed, the specimen was withdrawn from the midline incision and passed off for permanent specimen.  The abdomen was reinspected and no active bleeding was noted. Surgiflo was applied to the nephrectomy bed.  The 12 mm port sites were closed with the suture passer with the except of the port used for the liver retractor.  The midline fascia was closed with running 1-0 looped PDS suture.  There was very poor quality fascia noted at the port site incision.  The subcutaneous tissue was approximated with 2-0 vicryl and a flat drain was placed.  The  skin was closed with staples.  The port incisions were closed with 4-0 monocryl and dermabond.  Exparel was instilled for anesthetic effect.  A dressing was applied.  Patient tolerated the procedure well and was stable postoperatively.  Plan: Stat labs will be obtained.  Anticipate the patient will be in the hospital 1-2 nights  as long as he does well.

## 2020-11-15 NOTE — Anesthesia Postprocedure Evaluation (Signed)
Anesthesia Post Note  Patient: Samantha Roberson  Procedure(s) Performed: HAND ASSISTED LAPAROSCOPIC RADICAL NEPHRECTOMY (Right )     Patient location during evaluation: PACU Anesthesia Type: General Level of consciousness: awake and alert Pain management: pain level controlled Vital Signs Assessment: post-procedure vital signs reviewed and stable Respiratory status: spontaneous breathing, nonlabored ventilation, respiratory function stable and patient connected to nasal cannula oxygen Cardiovascular status: blood pressure returned to baseline and stable Postop Assessment: no apparent nausea or vomiting Anesthetic complications: no   No complications documented.  Last Vitals:  Vitals:   11/15/20 1515 11/15/20 1603  BP: (!) 147/74 (!) 151/96  Pulse: 88 93  Resp: 16 20  Temp: 36.7 C 36.8 C  SpO2: 94% 95%    Last Pain:  Vitals:   11/15/20 1603  TempSrc: Oral  PainSc:                  [name]

## 2020-11-15 NOTE — Discharge Instructions (Signed)

## 2020-11-15 NOTE — Anesthesia Procedure Notes (Signed)
Procedure Name: Intubation Date/Time: 11/15/2020 7:23 AM Performed by: [place] Tequilla Cousineau C, CRNA Pre-anesthesia Checklist: Patient identified, Emergency Drugs available, Suction available and Patient being monitored Patient Re-evaluated:Patient Re-evaluated prior to induction Oxygen Delivery Method: Circle system utilized Preoxygenation: Pre-oxygenation with 100% oxygen Induction Type: IV induction Ventilation: Mask ventilation without difficulty Laryngoscope Size: Mac and 3 Grade View: Grade I Tube type: Oral Tube size: 7.0 mm Number of attempts: 1 Airway Equipment and Method: Stylet and Oral airway Placement Confirmation: ETT inserted through vocal cords under direct vision,  positive ETCO2 and breath sounds checked- equal and bilateral Secured at: 21 cm Tube secured with: Tape Dental Injury: Teeth and Oropharynx as per pre-operative assessment

## 2020-11-15 NOTE — Interval H&P Note (Signed)
History and Physical Interval Note:  11/15/2020 6:55 AM  Samantha Roberson  has presented today for surgery, with the diagnosis of RIGHT RENAL MASS.  The various methods of treatment have been discussed with the patient and family. After consideration of risks, benefits and other options for treatment, the patient has consented to  Procedure(s) with comments: HAND ASSISTED LAPAROSCOPIC RADICAL NEPHRECTOMY (Right) - 4 HRS as a surgical intervention.  The patient's history has been reviewed, patient examined, no change in status, stable for surgery.  I have reviewed the patient's chart and labs.  Questions were answered to the patient's satisfaction.     Sydney Azure D Normalee Sistare

## 2020-11-15 NOTE — Transfer of Care (Signed)
Immediate Anesthesia Transfer of Care Note  Patient: Ceriah Kohler Alabi  Procedure(s) Performed: HAND ASSISTED LAPAROSCOPIC RADICAL NEPHRECTOMY (Right )  Patient Location: PACU  Anesthesia Type:General  Level of Consciousness: awake, alert  and oriented  Airway & Oxygen Therapy: Patient Spontanous Breathing and Patient connected to face mask oxygen  Post-op Assessment: Report given to RN and Post -op Vital signs reviewed and stable  Post vital signs: Reviewed and stable  Last Vitals:  Vitals Value Taken Time  BP 149/81 11/15/20 1053  Temp    Pulse 82 11/15/20 1057  Resp 25 11/15/20 1057  SpO2 99 % 11/15/20 1057  Vitals shown include unvalidated device data.  Last Pain:  Vitals:   11/15/20 0541  TempSrc:   PainSc: 0-No pain      Patients Stated Pain Goal: 4 (45/84/83 5075)  Complications: No complications documented.

## 2020-11-16 ENCOUNTER — Encounter (HOSPITAL_COMMUNITY): Payer: Self-pay | Admitting: Urology

## 2020-11-16 LAB — SURGICAL PATHOLOGY

## 2020-11-16 LAB — BASIC METABOLIC PANEL
Anion gap: 8 (ref 5–15)
BUN: 12 mg/dL (ref 6–20)
CO2: 24 mmol/L (ref 22–32)
Calcium: 8.7 mg/dL — ABNORMAL LOW (ref 8.9–10.3)
Chloride: 105 mmol/L (ref 98–111)
Creatinine, Ser: 1.06 mg/dL — ABNORMAL HIGH (ref 0.44–1.00)
GFR, Estimated: >60 mL/min (ref >60)
Glucose, Bld: 110 mg/dL — ABNORMAL HIGH (ref 70–99)
Potassium: 4 mmol/L (ref 3.5–5.1)
Sodium: 137 mmol/L (ref 135–145)

## 2020-11-16 LAB — HEMOGLOBIN AND HEMATOCRIT, BLOOD
HCT: 41.2 % (ref 36.0–46.0)
Hemoglobin: 13.3 g/dL (ref 12.0–15.0)

## 2020-11-16 MED ORDER — DEXTROSE-NACL 5-0.45 % IV SOLN: INTRAVENOUS | Status: DC

## 2020-11-16 NOTE — Plan of Care (Signed)
  Problem: Activity: Goal: Risk for activity intolerance will decrease Outcome: Progressing Note: Patient able to ambulate the hall; approximately 300 ft., and to the bathroom. Patient did feel dizzy and had pain when getting up. Patient had abdominal binder on while ambulating.    Problem: Nutrition: Goal: Adequate nutrition will be maintained Outcome: Progressing Note: Patient has been able to tolerate liquids thus far without any issues.    Problem: Pain Managment: Goal: General experience of comfort will improve Outcome: Progressing Note: Patient has high pain threshold. 7-10 pain is tolerable. Nursing staff educated patient not to wait until the pain gets too bad before calling.    Problem: Skin Integrity: Goal: Risk for impaired skin integrity will decrease Outcome: Progressing Note: Patient's jp drain is draining with minimal output at this time.

## 2020-11-16 NOTE — Progress Notes (Signed)
Urology Inpatient Progress Report  Procedure(s): HAND ASSISTED LAPAROSCOPIC RADICAL NEPHRECTOMY  1 Day Post-Op   Intv/Subj: No acute events overnight.  Pain is well-controlled.  +Clear liquids.  No ambulation yet and no flatus.  Active Problems:   Right renal mass  Current Facility-Administered Medications  Medication Dose Route Frequency Provider Last Rate Last Admin  . 0.9 %  sodium chloride infusion   Intravenous Continuous Jacalyn Lefevre D, MD 100 mL/hr at 11/16/20 0704 New Bag at 11/16/20 0704  . acetaminophen (TYLENOL) tablet 650 mg  650 mg Oral Q4H PRN Jacalyn Lefevre D, MD   650 mg at 11/16/20 0401  . Chlorhexidine Gluconate Cloth 2 % PADS 6 each  6 each Topical Daily Robley Fries, MD   6 each at 11/16/20 0021  . docusate sodium (COLACE) capsule 100 mg  100 mg Oral BID Jacalyn Lefevre D, MD   100 mg at 11/15/20 2111  . furosemide (LASIX) tablet 20 mg  20 mg Oral Daily PRN Jacalyn Lefevre D, MD      . morphine 4 MG/ML injection 2-4 mg  2-4 mg Intravenous Q2H PRN Jacalyn Lefevre D, MD      . ondansetron (ZOFRAN) injection 4 mg  4 mg Intravenous Q4H PRN Jacalyn Lefevre D, MD      . oxyCODONE (Oxy IR/ROXICODONE) immediate release tablet 5 mg  5 mg Oral Q4H PRN Jacalyn Lefevre D, MD   5 mg at 11/16/20 0704  . pantoprazole (PROTONIX) EC tablet 40 mg  40 mg Oral Daily Jamerica Snavely, Simone Curia D, MD      . senna [facility]) tablet 8.6 mg  1 tablet Oral BID Jacalyn Lefevre D, MD   8.6 mg at 11/15/20 2111     Objective: Vital: Vitals:   11/15/20 1603 11/15/20 2034 11/16/20 0013 11/16/20 0356  BP: (!) 151/96 127/71 120/66 120/63  Pulse: 93 (!) 101 98 86  Resp: 20 20 18 18   Temp: 98.3 F (36.8 C) 99.2 F (37.3 C) 98.2 F (36.8 C) 98.3 F (36.8 C)  TempSrc: Oral Oral    SpO2: 95% 93% 94% 99%  Weight:      Height:       I/Os: I/O last 3 completed shifts: In: 3717.3 [I.V.:3162.3; Other:5; IV Piggyback:550] Out: 840 [Urine:725; Drains:15; Blood:100]  Physical Exam:  General:  Patient is in no apparent distress Lungs: Normal respiratory effort, chest expands symmetrically. GI: Incisions are c/d/i with mild ecchymosis around port sites, honeycomb dressing over port site. The abdomen is soft and appropriately ttp. JP drain with scant drainage Foley: draining clear yellow urine Ext: lower extremities symmetric  Lab Results: Recent Labs    11/15/20 1104 11/16/20 0554  HGB 14.6 13.3  HCT 43.9 41.2   Recent Labs    11/15/20 1104 11/16/20 0554  NA 138 137  K 3.9 4.0  CL 104 105  CO2 25 24  GLUCOSE 162* 110*  BUN 11 12  CREATININE 0.80 1.06*  CALCIUM 8.8* 8.7*   No results for input(s): LABPT, INR in the last 72 hours. No results for input(s): LABURIN in the last 72 hours. Results for orders placed or performed during the hospital encounter of 11/11/20  SARS CORONAVIRUS 2 (TAT 6-24 HRS) Nasopharyngeal Nasopharyngeal Swab     Status: None   Collection Time: 11/11/20  2:06 PM   Specimen: Nasopharyngeal Swab  Result Value Ref Range Status   SARS Coronavirus 2 NEGATIVE NEGATIVE Final    Comment: (NOTE) SARS-CoV-2 target nucleic acids are NOT DETECTED.  The SARS-CoV-2 RNA is generally detectable in upper and lower respiratory specimens during the acute phase of infection. Negative results do not preclude SARS-CoV-2 infection, do not rule out co-infections with other pathogens, and should not be used as the sole basis for treatment or other patient management decisions. Negative results must be combined with clinical observations, patient history, and epidemiological information. The expected result is Negative.  Fact Sheet for Patients: SugarRoll.be  Fact Sheet for Healthcare Providers: https://www.woods-mathews.com/  This test is not yet approved or cleared by the [place] FDA and  has been authorized for detection and/or diagnosis of SARS-CoV-2 by FDA under an Emergency Use Authorization (EUA). This  EUA will remain  in effect (meaning this test can be used) for the duration of the COVID-19 declaration under Se ction 564(b)(1) of the Act, 21 U.S.C. section 360bbb-3(b)(1), unless the authorization is terminated or revoked sooner.  Performed at [facility], [place] [address] [place], [place] [address]       Assessment: 59 yo woman with right renal mass POD1 s/p  HAND ASSISTED LAPAROSCOPIC RADICAL NEPHRECTOMY, 1 Day Post-Op  doing well.  Creatinine has not yet peaked.  Hemoglobin stable.  Awaiting return of bowel function.  Plan: -continue clear liquid diet, advance as tolerated -ambulate today with abdominal binder on (patient had poor fascial layer and kept in bed yesterday) -d/c foley this AM -AM BMP -SCDs for DVT ppx -home meds -pain meds PRN -anticipate d/c tomorrow   Jacalyn Lefevre, MD Urology 11/16/2020, 8:26 AM

## 2020-11-17 ENCOUNTER — Other Ambulatory Visit (HOSPITAL_COMMUNITY): Payer: Self-pay | Admitting: Urology

## 2020-11-17 LAB — BASIC METABOLIC PANEL
Anion gap: 7 (ref 5–15)
BUN: 9 mg/dL (ref 6–20)
CO2: 26 mmol/L (ref 22–32)
Calcium: 8.7 mg/dL — ABNORMAL LOW (ref 8.9–10.3)
Chloride: 105 mmol/L (ref 98–111)
Creatinine, Ser: 1.03 mg/dL — ABNORMAL HIGH (ref 0.44–1.00)
GFR, Estimated: >60 mL/min (ref >60)
Glucose, Bld: 117 mg/dL — ABNORMAL HIGH (ref 70–99)
Potassium: 4 mmol/L (ref 3.5–5.1)
Sodium: 138 mmol/L (ref 135–145)

## 2020-11-17 MED ORDER — OXYCODONE HCL 5 MG PO TABS
5.0000 mg | ORAL_TABLET | ORAL | 0 refills | Status: DC | PRN
Start: 2020-11-17 — End: 2020-11-17

## 2020-11-17 MED FILL — oxyCODONE HCL 5 MG TABS: 5 | 3 days supply | Qty: 20 | Fill #0

## 2020-11-17 NOTE — Progress Notes (Signed)
Urology Inpatient Progress Report  Procedure(s): HAND ASSISTED LAPAROSCOPIC RADICAL NEPHRECTOMY  2 Days Post-Op   Intv/Subj: Patient with abdominal wall spasms overnight.  She has ambulated and had flatus.   Active Problems:   Right renal mass  Current Facility-Administered Medications  Medication Dose Route Frequency Provider Last Rate Last Admin  . acetaminophen (TYLENOL) tablet 650 mg  650 mg Oral Q4H PRN Jacalyn Lefevre D, MD   650 mg at 11/17/20 0648  . Chlorhexidine Gluconate Cloth 2 % PADS 6 each  6 each Topical Daily Robley Fries, MD   6 each at 11/16/20 [phone]  . dextrose 5 %-0.45 % sodium chloride infusion   Intravenous Continuous Robley Fries, MD 75 mL/hr at 11/16/20 2221 New Bag at 11/16/20 2221  . docusate sodium (COLACE) capsule 100 mg  100 mg Oral BID Jacalyn Lefevre D, MD   100 mg at 11/16/20 2212  . furosemide (LASIX) tablet 20 mg  20 mg Oral Daily PRN Jacalyn Lefevre D, MD      . morphine 4 MG/ML injection 2-4 mg  2-4 mg Intravenous Q2H PRN Jacalyn Lefevre D, MD   4 mg at 11/17/20 0130  . ondansetron (ZOFRAN) injection 4 mg  4 mg Intravenous Q4H PRN Jacalyn Lefevre D, MD   4 mg at 11/16/20 1251  . oxyCODONE (Oxy IR/ROXICODONE) immediate release tablet 5 mg  5 mg Oral Q4H PRN Jacalyn Lefevre D, MD   5 mg at 11/17/20 0649  . pantoprazole (PROTONIX) EC tablet 40 mg  40 mg Oral Daily Jacalyn Lefevre D, MD   40 mg at 11/16/20 0947  . senna (SENOKOT) tablet 8.6 mg  1 tablet Oral BID Jacalyn Lefevre D, MD   8.6 mg at 11/16/20 2212     Objective: Vital: Vitals:   11/16/20 1319 11/16/20 2131 11/17/20 0129 11/17/20 0442  BP: (!) 102/54 135/75 128/76 134/78  Pulse: 91 92 89 97  Resp: 20 20 (!) 24 20  Temp: 98.7 F (37.1 C) 98.5 F (36.9 C) 99.2 F (37.3 C) 98.6 F (37 C)  TempSrc:  Oral Oral Oral  SpO2: (!) 89% 92% 94% 94%  Weight:      Height:       I/Os: I/O last 3 completed shifts: In: 2739.6 [P.O.:350; I.V.:2184.6; Other:5; IV Piggyback:200] Out:  3267 [Urine:1160; Drains:5]  Physical Exam:  General: Patient is in no apparent distress Lungs: Normal respiratory effort, chest expands symmetrically. GI: Incisions are c/d/i and honeycomb dressing removed.  Port site with staples. The abdomen is soft and appropriately ttp.  JP drain with minimal drainage serosanguinous drainage Foley: removed yesterday Ext: lower extremities symmetric  Lab Results: Recent Labs    11/15/20 1104 11/16/20 0554  HGB 14.6 13.3  HCT 43.9 41.2   Recent Labs    11/15/20 1104 11/16/20 0554 11/17/20 0556  NA 138 137 138  K 3.9 4.0 4.0  CL 104 105 105  CO2 25 24 26   GLUCOSE 162* 110* 117*  BUN 11 12 9   CREATININE 0.80 1.06* 1.03*  CALCIUM 8.8* 8.7* 8.7*   No results for input(s): LABPT, INR in the last 72 hours. No results for input(s): LABURIN in the last 72 hours. Results for orders placed or performed during the hospital encounter of 11/11/20  SARS CORONAVIRUS 2 (TAT 6-24 HRS) Nasopharyngeal Nasopharyngeal Swab     Status: None   Collection Time: 11/11/20  2:06 PM   Specimen: Nasopharyngeal Swab  Result Value Ref Range Status   SARS Coronavirus 2  NEGATIVE NEGATIVE Final    Comment: (NOTE) SARS-CoV-2 target nucleic acids are NOT DETECTED.  The SARS-CoV-2 RNA is generally detectable in upper and lower respiratory specimens during the acute phase of infection. Negative results do not preclude SARS-CoV-2 infection, do not rule out co-infections with other pathogens, and should not be used as the sole basis for treatment or other patient management decisions. Negative results must be combined with clinical observations, patient history, and epidemiological information. The expected result is Negative.  Fact Sheet for Patients: SugarRoll.be  Fact Sheet for Healthcare Providers: https://www.woods-mathews.com/  This test is not yet approved or cleared by the [place] FDA and  has been  authorized for detection and/or diagnosis of SARS-CoV-2 by FDA under an Emergency Use Authorization (EUA). This EUA will remain  in effect (meaning this test can be used) for the duration of the COVID-19 declaration under Se ction 564(b)(1) of the Act, 21 U.S.C. section 360bbb-3(b)(1), unless the authorization is terminated or revoked sooner.  Performed at [facility], [place] [address] [place], [place] [address]     Studies/Results: No results found.  Assessment: Assessment: 59 yo woman with right renal mass POD1 s/p  HAND ASSISTED LAPAROSCOPIC RADICAL NEPHRECTOMY, 2 Day Post-Op  doing well.  Creatinine has peaked and is stable.  +Flatus and ambulation.   Plan: -advance to general diet -KVO fluids -ambulate today with abdominal binder on  -SCDs for DVT ppx -home meds -pain meds PRN -anticipate d/c this afternoon vs tomorrow   Jacalyn Lefevre, MD Urology 11/17/2020, 9:34 AM

## 2020-11-17 NOTE — Progress Notes (Signed)
Pt may be discharged once heart rate is below 100 per dr Claudia Desanctis.

## 2020-11-17 NOTE — Plan of Care (Signed)
  Problem: Clinical Measurements: Goal: Will remain free from infection Outcome: Progressing   Problem: Clinical Measurements: Goal: Diagnostic test results will improve Outcome: Progressing   Problem: Clinical Measurements: Goal: Respiratory complications will improve Outcome: Progressing   Problem: Activity: Goal: Risk for activity intolerance will decrease Outcome: Progressing   Problem: Nutrition: Goal: Adequate nutrition will be maintained Outcome: Progressing   Problem: Pain Managment: Goal: General experience of comfort will improve Outcome: Progressing   Problem: Skin Integrity: Goal: Risk for impaired skin integrity will decrease Outcome: Progressing

## 2020-11-17 NOTE — Discharge Summary (Signed)
Date of admission: 11/15/2020  Date of discharge: 11/17/2020  Admission diagnosis: right renal mass  Discharge diagnosis: right renal mass  Secondary diagnoses:  Patient Active Problem List   Diagnosis Date Noted  . Right renal mass 11/15/2020  . Varicose veins of lower extremities with other complications 53/29/9242    Procedures performed: Procedure(s): HAND ASSISTED LAPAROSCOPIC RADICAL NEPHRECTOMY  History and Physical: For full details, please see admission history and physical. Briefly, Samantha Roberson is a 59 y.o. year old patient with 5 cm enhancing right renal mass concerning for renal cell carcinoma.   Hospital Course: Patient tolerated the procedure well.  She was then transferred to the floor after an uneventful PACU stay.  Her hospital course was uncomplicated.  On POD#2 she had met discharge criteria: was eating a regular diet, was up and ambulating independently,  pain was well controlled, was voiding without a catheter, and was ready to for discharge.   Laboratory values:  Recent Labs    11/15/20 1104 11/16/20 0554  HGB 14.6 13.3  HCT 43.9 41.2   Recent Labs    11/15/20 1104 11/16/20 0554 11/17/20 0556  NA 138 137 138  K 3.9 4.0 4.0  CL 104 105 105  CO2 _0 GLUCOSE 162* 110* 117*  BUN _1 CREATININE 0.80 1.06* 1.03*  CALCIUM 8.8* 8.7* 8.7*   No results for input(s): LABPT, INR in the last 72 hours. No results for input(s): LABURIN in the last 72 hours. Results for orders placed or performed during the hospital encounter of 11/11/20  SARS CORONAVIRUS 2 (TAT 6-24 HRS) Nasopharyngeal Nasopharyngeal Swab     Status: None   Collection Time: 11/11/20  2:06 PM   Specimen: Nasopharyngeal Swab  Result Value Ref Range Status   SARS Coronavirus 2 NEGATIVE NEGATIVE Final    Comment: (NOTE) SARS-CoV-2 target nucleic acids are NOT DETECTED.  The SARS-CoV-2 RNA is generally detectable in upper and lower respiratory specimens during the acute  phase of infection. Negative results do not preclude SARS-CoV-2 infection, do not rule out co-infections with other pathogens, and should not be used as the sole basis for treatment or other patient management decisions. Negative results must be combined with clinical observations, patient history, and epidemiological information. The expected result is Negative.  Fact Sheet for Patients: SugarRoll.be  Fact Sheet for Healthcare Providers: https://www.woods-mathews.com/  This test is not yet approved or cleared by the [place] FDA and  has been authorized for detection and/or diagnosis of SARS-CoV-2 by FDA under an Emergency Use Authorization (EUA). This EUA will remain  in effect (meaning this test can be used) for the duration of the COVID-19 declaration under Se ction 564(b)(1) of the Act, 21 U.S.C. section 360bbb-3(b)(1), unless the authorization is terminated or revoked sooner.  Performed at [facility], [place] [address] [place], [place] [address]     Disposition: Home  Discharge instruction: The patient was instructed to be ambulatory but told to refrain from heavy lifting, strenuous activity, or driving.  Discharge medications:  Allergies as of 11/17/2020      Reactions   Benadryl [diphenhydramine Hcl] Anaphylaxis   Grass Extracts [gramineae Pollens] Anaphylaxis   Freshly cut grass closes her throat off      Medication List    STOP taking these medications   alum & mag hydroxide-simeth [phone] MG/5ML suspension Commonly known as: Maalox Max   dicyclomine 20 MG tablet Commonly known as: BENTYL   HYDROcodone-acetaminophen 5-325 MG tablet  Commonly known as: NORCO/VICODIN   oxyCODONE-acetaminophen 5-325 MG tablet Commonly known as: PERCOCET/ROXICET   ProAir HFA 108 (90 Base) MCG/ACT inhaler Generic drug: albuterol   sucralfate 1 g tablet Commonly known as: Carafate     TAKE these medications   aspirin  81 MG tablet Take 81 mg by mouth daily.   furosemide 20 MG tablet Commonly known as: LASIX Take 20 mg by mouth daily as needed for fluid.   multivitamin capsule Take 1 capsule by mouth daily.   oxyCODONE 5 MG immediate release tablet Commonly known as: Roxicodone Take 1 tablet (5 mg total) by mouth every 4 (four) hours as needed for severe pain (post nephrectomy).   pantoprazole 40 MG tablet Commonly known as: PROTONIX Take 1 tablet (40 mg total) by mouth daily.       Followup:   Follow-up Information    [facility] On 11/29/2020.   Why: 1:45pm Contact information: [place] [phone]

## 2020-12-01 ENCOUNTER — Telehealth: Payer: Self-pay | Admitting: Oncology

## 2020-12-01 NOTE — Telephone Encounter (Signed)
Received a new pt referral from Dr. Claudia Desanctis at [facility] for renal cell carcinoma. Ms. Samantha Roberson has been cld and scheduled to see Dr. Alen Blew on 12/17 at 2pm. Pt aware to arrive 30 minutes early.

## 2020-12-02 ENCOUNTER — Other Ambulatory Visit: Payer: Self-pay

## 2020-12-02 ENCOUNTER — Inpatient Hospital Stay: Payer: BC Managed Care – PPO | Attending: Oncology | Admitting: Oncology

## 2020-12-02 VITALS — BP 135/85 | HR 115 | Temp 99.6°F | Resp 17 | Ht 69.0 in | Wt 231.0 lb

## 2020-12-02 DIAGNOSIS — C641 Malignant neoplasm of right kidney, except renal pelvis: Secondary | ICD-10-CM | POA: Diagnosis not present

## 2020-12-02 NOTE — Progress Notes (Signed)
Reason for the request:    Kidney cancer  HPI: I was asked by Dr. Claudia Desanctis to evaluate [name] for new diagnosis of renal cell carcinoma.  She is a 59 year old woman without any significant comorbid condition presented with symptoms of abdominal pain in November 2021.  She was evaluated by CT scan of the abdomen and pelvis on October 18, 2020.  The CT scan showed a solid enhancing mass of the right kidney measuring 54 x 65 x 46 mm suspicious for kidney neoplasm.  Based on these findings she underwent laparoscopic radical nephrectomy of her right kidney completed on November 30 under the care of Dr. Claudia Desanctis.  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  The tumor focally invades the renal sinus fat indicating T3a disease.  Margins are negative.  She recovered well from her surgery and did not have any complications.  Clinically, she reports no abdominal pain or discomfort.  She denies any hematochezia or melena.  She denies any abdominal discomfort or change in bowel habits.  Full status quality of life remained excellent.  She has not resumed work related duties.  She does not report any headaches, blurry vision, syncope or seizures. Does not report any fevers, chills or sweats.  Does not report any cough, wheezing or hemoptysis.  Does not report any chest pain, palpitation, orthopnea or leg edema.  Does not report any nausea, vomiting or abdominal pain.  Does not report any constipation or diarrhea.  Does not report any skeletal complaints.    Does not report frequency, urgency or hematuria.  Does not report any skin rashes or lesions. Does not report any heat or cold intolerance.  Does not report any lymphadenopathy or petechiae.  Does not report any anxiety or depression.  Remaining review of systems is negative.    Past Medical History:  Diagnosis Date  . Asthma    Allergy induced  . Hypertension    No meds  . Leg pain   . Sinusitis   . Thyroid  nodule    Watching for 5 years  . Tonsillitis   :  Past Surgical History:  Procedure Laterality Date  . ABDOMINAL HYSTERECTOMY    . ANAL FISSURECTOMY  1999  . ENDOSCOPIC VEIN LASER TREATMENT    . LAPAROSCOPIC NEPHRECTOMY, HAND ASSISTED Right 11/15/2020   Procedure: HAND ASSISTED LAPAROSCOPIC RADICAL NEPHRECTOMY;  Surgeon: Robley Fries, MD;  Location: [facility];  Service: Urology;  Laterality: Right;  4 HRS  . SHOULDER OPEN ROTATOR CUFF REPAIR     bil and right shoulder also had bicep repair  . TONSILLECTOMY    :   Current Outpatient Medications:  .  aspirin 81 MG tablet, Take 81 mg by mouth daily., Disp: , Rfl:  .  furosemide (LASIX) 20 MG tablet, Take 20 mg by mouth daily as needed for fluid., Disp: , Rfl:  .  Multiple Vitamin (MULTIVITAMIN) capsule, Take 1 capsule by mouth daily. , Disp: , Rfl:  .  oxyCODONE (ROXICODONE) 5 MG immediate release tablet, Take 1 tablet (5 mg total) by mouth every 4 (four) hours as needed for severe pain (post nephrectomy)., Disp: 20 tablet, Rfl: 0 .  pantoprazole (PROTONIX) 40 MG tablet, Take 1 tablet (40 mg total) by mouth daily., Disp: 30 tablet, Rfl: 0:  Allergies  Allergen Reactions  . Benadryl [Diphenhydramine Hcl] Anaphylaxis  . Grass Extracts [Gramineae Pollens] Anaphylaxis    Freshly cut grass closes her throat off  :  Family History  Problem Relation Age of Onset  . Stroke Mother   . Hypertension Mother   . Cancer Father   . Hypertension Father   . Hyperlipidemia Father   . Hypertension Sister   :  Social History   Socioeconomic History  . Marital status: Married    Spouse name: Not on file  . Number of children: Not on file  . Years of education: Not on file  . Highest education level: Not on file  Occupational History  . Not on file  Tobacco Use  . Smoking status: Never Smoker  . Smokeless tobacco: Never Used  Vaping Use  . Vaping Use: Never used  Substance and Sexual Activity  . Alcohol use: Not Currently     Alcohol/week: 4.0 standard drinks    Types: 4 Standard drinks or equivalent per week    Comment: wine  . Drug use: No  . Sexual activity: Not Currently    Birth control/protection: Surgical  Other Topics Concern  . Not on file  Social History Narrative  . Not on file   Social Determinants of Health   Financial Resource Strain: Not on file  Food Insecurity: Not on file  Transportation Needs: Not on file  Physical Activity: Not on file  Stress: Not on file  Social Connections: Not on file  Intimate Partner Violence: Not on file  :  Pertinent items are noted in HPI.  Exam: Blood pressure 135/85, pulse (!) 115, temperature 99.6 F (37.6 C), temperature source Tympanic, resp. rate 17, height 5\' 9"  (1.753 m), weight 231 lb (104.8 kg), SpO2 95 %.  ECOG 0 General appearance: alert and cooperative appeared without distress. Head: atraumatic without any abnormalities. Eyes: conjunctivae/corneas clear. PERRL.  Sclera anicteric. Throat: lips, mucosa, and tongue normal; without oral thrush or ulcers. Resp: clear to auscultation bilaterally without rhonchi, wheezes or dullness to percussion. Cardio: regular rate and rhythm, S1, S2 normal, no murmur, click, rub or gallop GI: soft, non-tender; bowel sounds normal; no masses,  no organomegaly Skin: Skin color, texture, turgor normal. No rashes or lesions Lymph nodes: Cervical, supraclavicular, and axillary nodes normal. Neurologic: Grossly normal without any motor, sensory or deep tendon reflexes. Musculoskeletal: No joint deformity or effusion.  CBC    Component Value Date/Time   WBC 6.5 11/07/2020 0950   RBC 4.95 11/07/2020 0950   HGB 13.3 11/16/2020 0554   HCT 41.2 11/16/2020 0554   PLT 250 11/07/2020 0950   MCV 91.7 11/07/2020 0950   MCH 30.9 11/07/2020 0950   MCHC 33.7 11/07/2020 0950   RDW 12.0 11/07/2020 0950   LYMPHSABS 2.1 10/18/2020 0732   MONOABS 0.5 10/18/2020 0732   EOSABS 0.3 10/18/2020 0732   BASOSABS 0.1  10/18/2020 0732     Chemistry      Component Value Date/Time   NA 138 11/17/2020 0556   K 4.0 11/17/2020 0556   CL 105 11/17/2020 0556   CO2 26 11/17/2020 0556   BUN 9 11/17/2020 0556   CREATININE 1.03 (H) 11/17/2020 0556      Component Value Date/Time   CALCIUM 8.7 (L) 11/17/2020 0556   ALKPHOS 110 10/18/2020 0732   AST 42 (H) 10/18/2020 0732   ALT 52 (H) 10/18/2020 0732   BILITOT 0.9 10/18/2020 0732      Assessment and Plan:   59 year old with:  1.  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  She was found to have close to a 5 cm tumor on her right kidney.  She underwent laparoscopic radical nephrectomy completed on November 15, 2020.  The final pathology showed T3a tumor with nuclear grade 3 and no evidence of metastatic disease.  The natural course of this disease was discussed today with the patient and her husband and additional treatment were discussed.  The role of adjuvant Pembrolizumab was discussed today in detail including the studies that have led to the approval of this medication for kidney cancer.  Although she technically qualifies given her intermediate to high risk kidney cancer the benefit remains rather marginal in terms of progression free survival and overall survival.  The benefit is more substantial for lymph node positive disease as well as high-grade tumors.   The risks and benefits associated with 1 year of Pembrolizumab were discussed at this time.  Potential complications including autoimmune complications such as pneumonitis, colitis, thyroid disease and dermatitis.  The benefit as discussed include improvement in progression free survival and possibly overall survival although more time is needed to determine the absolute benefit in this case.  After discussion today, I have recommended continued active surveillance at this time and deferring any systemic treatment unless he developed metastatic disease.  I think improvements would be  marginal with adjuvant immunotherapy at this time.  She is already scheduled to have a repeat imaging studies with Dr. Claudia Desanctis in 3 months.   2.  Follow-up: I am happy to see her in the future as needed.   60  minutes were dedicated to this visit. The time was spent on reviewing laboratory data, imaging studies, discussing treatment options, and answering questions regarding future plan.       A copy of this consult has been forwarded to the requesting physician.

## 2020-12-28 ENCOUNTER — Telehealth: Payer: Self-pay | Admitting: Oncology

## 2020-12-28 NOTE — Telephone Encounter (Signed)
Release: [id number]  Released records to [facility] urology specialists to [phone]

## 2021-05-15 ENCOUNTER — Other Ambulatory Visit: Payer: Self-pay

## 2021-05-15 ENCOUNTER — Emergency Department (HOSPITAL_BASED_OUTPATIENT_CLINIC_OR_DEPARTMENT_OTHER): Payer: BC Managed Care – PPO

## 2021-05-15 ENCOUNTER — Inpatient Hospital Stay (HOSPITAL_BASED_OUTPATIENT_CLINIC_OR_DEPARTMENT_OTHER)
Admission: EM | Admit: 2021-05-15 | Discharge: 2021-05-20 | DRG: 419 | Disposition: A | Discharge status: Home / Self Care | Payer: BC Managed Care – PPO | Attending: Internal Medicine | Admitting: Internal Medicine

## 2021-05-15 ENCOUNTER — Encounter (HOSPITAL_BASED_OUTPATIENT_CLINIC_OR_DEPARTMENT_OTHER): Payer: Self-pay | Admitting: *Deleted

## 2021-05-15 DIAGNOSIS — K219 Gastro-esophageal reflux disease without esophagitis: Secondary | ICD-10-CM | POA: Diagnosis present

## 2021-05-15 DIAGNOSIS — J45909 Unspecified asthma, uncomplicated: Secondary | ICD-10-CM | POA: Diagnosis present

## 2021-05-15 DIAGNOSIS — K8020 Calculus of gallbladder without cholecystitis without obstruction: Secondary | ICD-10-CM | POA: Diagnosis not present

## 2021-05-15 DIAGNOSIS — Z8249 Family history of ischemic heart disease and other diseases of the circulatory system: Secondary | ICD-10-CM

## 2021-05-15 DIAGNOSIS — I10 Essential (primary) hypertension: Secondary | ICD-10-CM | POA: Diagnosis present

## 2021-05-15 DIAGNOSIS — I129 Hypertensive chronic kidney disease with stage 1 through stage 4 chronic kidney disease, or unspecified chronic kidney disease: Secondary | ICD-10-CM | POA: Diagnosis present

## 2021-05-15 DIAGNOSIS — Z888 Allergy status to other drugs, medicaments and biological substances status: Secondary | ICD-10-CM | POA: Diagnosis not present

## 2021-05-15 DIAGNOSIS — E669 Obesity, unspecified: Secondary | ICD-10-CM | POA: Diagnosis present

## 2021-05-15 DIAGNOSIS — R1011 Right upper quadrant pain: Secondary | ICD-10-CM

## 2021-05-15 DIAGNOSIS — Z7982 Long term (current) use of aspirin: Secondary | ICD-10-CM

## 2021-05-15 DIAGNOSIS — K8050 Calculus of bile duct without cholangitis or cholecystitis without obstruction: Secondary | ICD-10-CM | POA: Diagnosis present

## 2021-05-15 DIAGNOSIS — K8000 Calculus of gallbladder with acute cholecystitis without obstruction: Secondary | ICD-10-CM | POA: Diagnosis present

## 2021-05-15 DIAGNOSIS — N1831 Chronic kidney disease, stage 3a: Secondary | ICD-10-CM | POA: Diagnosis present

## 2021-05-15 DIAGNOSIS — R7989 Other specified abnormal findings of blood chemistry: Secondary | ICD-10-CM | POA: Diagnosis not present

## 2021-05-15 DIAGNOSIS — Z419 Encounter for procedure for purposes other than remedying health state, unspecified: Secondary | ICD-10-CM

## 2021-05-15 DIAGNOSIS — Z90710 Acquired absence of both cervix and uterus: Secondary | ICD-10-CM | POA: Diagnosis not present

## 2021-05-15 DIAGNOSIS — D72829 Elevated white blood cell count, unspecified: Secondary | ICD-10-CM | POA: Diagnosis not present

## 2021-05-15 DIAGNOSIS — N183 Chronic kidney disease, stage 3 unspecified: Secondary | ICD-10-CM | POA: Diagnosis present

## 2021-05-15 DIAGNOSIS — Z905 Acquired absence of kidney: Secondary | ICD-10-CM

## 2021-05-15 DIAGNOSIS — Z6833 Body mass index (BMI) 33.0-33.9, adult: Secondary | ICD-10-CM

## 2021-05-15 DIAGNOSIS — I517 Cardiomegaly: Secondary | ICD-10-CM | POA: Diagnosis not present

## 2021-05-15 DIAGNOSIS — Z800 Family history of malignant neoplasm of digestive organs: Secondary | ICD-10-CM | POA: Diagnosis not present

## 2021-05-15 DIAGNOSIS — E041 Nontoxic single thyroid nodule: Secondary | ICD-10-CM | POA: Diagnosis present

## 2021-05-15 DIAGNOSIS — K660 Peritoneal adhesions (postprocedural) (postinfection): Secondary | ICD-10-CM | POA: Diagnosis present

## 2021-05-15 DIAGNOSIS — Z79899 Other long term (current) drug therapy: Secondary | ICD-10-CM | POA: Diagnosis not present

## 2021-05-15 DIAGNOSIS — Z20822 Contact with and (suspected) exposure to covid-19: Secondary | ICD-10-CM | POA: Diagnosis present

## 2021-05-15 DIAGNOSIS — R933 Abnormal findings on diagnostic imaging of other parts of digestive tract: Secondary | ICD-10-CM

## 2021-05-15 LAB — CBC WITH DIFFERENTIAL/PLATELET
Abs Immature Granulocytes: 0.03 10*3/uL (ref 0.00–0.07)
Basophils Absolute: 0.1 10*3/uL (ref 0.0–0.1)
Basophils Relative: 1 %
Eosinophils Absolute: 0.6 10*3/uL — ABNORMAL HIGH (ref 0.0–0.5)
Eosinophils Relative: 6 %
HCT: 41.2 % (ref 36.0–46.0)
Hemoglobin: 13.8 g/dL (ref 12.0–15.0)
Immature Granulocytes: 0 %
Lymphocytes Relative: 19 %
Lymphs Abs: 1.8 10*3/uL (ref 0.7–4.0)
MCH: 30.6 pg (ref 26.0–34.0)
MCHC: 33.5 g/dL (ref 30.0–36.0)
MCV: 91.4 fL (ref 80.0–100.0)
Monocytes Absolute: 0.6 10*3/uL (ref 0.1–1.0)
Monocytes Relative: 6 %
Neutro Abs: 6.4 10*3/uL (ref 1.7–7.7)
Neutrophils Relative %: 68 %
Platelets: 242 10*3/uL (ref 150–400)
RBC: 4.51 MIL/uL (ref 3.87–5.11)
RDW: 12.4 % (ref 11.5–15.5)
WBC: 9.5 10*3/uL (ref 4.0–10.5)
nRBC: 0 % (ref 0.0–0.2)

## 2021-05-15 LAB — COMPREHENSIVE METABOLIC PANEL
ALT: 83 U/L — ABNORMAL HIGH (ref 0–44)
AST: 69 U/L — ABNORMAL HIGH (ref 15–41)
Albumin: 3.6 g/dL (ref 3.5–5.0)
Alkaline Phosphatase: 163 U/L — ABNORMAL HIGH (ref 38–126)
Anion gap: 8 (ref 5–15)
BUN: 16 mg/dL (ref 8–23)
CO2: 26 mmol/L (ref 22–32)
Calcium: 9.2 mg/dL (ref 8.9–10.3)
Chloride: 103 mmol/L (ref 98–111)
Creatinine, Ser: 1.07 mg/dL — ABNORMAL HIGH (ref 0.44–1.00)
GFR, Estimated: 59 mL/min — ABNORMAL LOW (ref >60)
Glucose, Bld: 114 mg/dL — ABNORMAL HIGH (ref 70–99)
Potassium: 3.5 mmol/L (ref 3.5–5.1)
Sodium: 137 mmol/L (ref 135–145)
Total Bilirubin: 0.8 mg/dL (ref 0.3–1.2)
Total Protein: 6.9 g/dL (ref 6.5–8.1)

## 2021-05-15 LAB — URINALYSIS, ROUTINE W REFLEX MICROSCOPIC
Bilirubin Urine: NEGATIVE
Glucose, UA: NEGATIVE mg/dL
Hgb urine dipstick: NEGATIVE
Ketones, ur: NEGATIVE mg/dL
Leukocytes,Ua: NEGATIVE
Nitrite: NEGATIVE
Protein, ur: NEGATIVE mg/dL
Specific Gravity, Urine: <1.005 — ABNORMAL LOW (ref 1.005–1.030)
pH: 6 (ref 5.0–8.0)

## 2021-05-15 LAB — LIPASE, BLOOD: Lipase: 35 U/L (ref 11–51)

## 2021-05-15 LAB — RESP PANEL BY RT-PCR (FLU A&B, COVID) ARPGX2
Influenza A by PCR: NEGATIVE
Influenza B by PCR: NEGATIVE
SARS Coronavirus 2 by RT PCR: NEGATIVE

## 2021-05-15 LAB — TROPONIN I (HIGH SENSITIVITY)
Troponin I (High Sensitivity): 3 ng/L (ref <18)
Troponin I (High Sensitivity): 3 ng/L (ref <18)

## 2021-05-15 MED ORDER — HYDROCODONE-ACETAMINOPHEN 5-325 MG PO TABS
1.0000 | ORAL_TABLET | ORAL | Status: DC | PRN
  Administered 2021-05-15 – 2021-05-18 (×6): 2 via ORAL
  Filled 2021-05-15 (×6): qty 2

## 2021-05-15 MED ORDER — ACETAMINOPHEN 325 MG PO TABS
650.0000 mg | ORAL_TABLET | Freq: Four times a day (QID) | ORAL | Status: DC | PRN
  Administered 2021-05-16 – 2021-05-19 (×7): 650 mg via ORAL
  Filled 2021-05-15 (×8): qty 2

## 2021-05-15 MED ORDER — FENTANYL CITRATE (PF) 100 MCG/2ML IJ SOLN: 12.5000 ug | INTRAMUSCULAR | Status: DC | PRN

## 2021-05-15 MED ORDER — METOPROLOL TARTRATE 12.5 MG HALF TABLET
12.5000 mg | ORAL_TABLET | Freq: Two times a day (BID) | ORAL | Status: DC
  Administered 2021-05-15 – 2021-05-20 (×10): 12.5 mg via ORAL
  Filled 2021-05-15 (×10): qty 1

## 2021-05-15 MED ORDER — SODIUM CHLORIDE 0.9% FLUSH
3.0000 mL | Freq: Two times a day (BID) | INTRAVENOUS | Status: DC
  Administered 2021-05-15 – 2021-05-18 (×4): 3 mL via INTRAVENOUS

## 2021-05-15 MED ORDER — MORPHINE SULFATE (PF) 2 MG/ML IV SOLN
2.0000 mg | Freq: Once | INTRAVENOUS | Status: AC
  Administered 2021-05-15: 2 mg via INTRAVENOUS
  Filled 2021-05-15: qty 1

## 2021-05-15 MED ORDER — SODIUM CHLORIDE 0.9 % IV BOLUS
1000.0000 mL | Freq: Once | INTRAVENOUS | Status: AC
  Administered 2021-05-15: 1000 mL via INTRAVENOUS

## 2021-05-15 MED ORDER — ACETAMINOPHEN 650 MG RE SUPP: 650.0000 mg | Freq: Four times a day (QID) | RECTAL | Status: DC | PRN

## 2021-05-15 MED ORDER — PANTOPRAZOLE SODIUM 40 MG IV SOLR
40.0000 mg | Freq: Once | INTRAVENOUS | Status: AC
  Administered 2021-05-15: 40 mg via INTRAVENOUS
  Filled 2021-05-15: qty 40

## 2021-05-15 MED ORDER — MORPHINE SULFATE (PF) 4 MG/ML IV SOLN
4.0000 mg | Freq: Once | INTRAVENOUS | Status: AC
  Administered 2021-05-15: 4 mg via INTRAVENOUS
  Filled 2021-05-15: qty 1

## 2021-05-15 MED ORDER — SODIUM CHLORIDE 0.9% FLUSH: 3.0000 mL | INTRAVENOUS | Status: DC | PRN

## 2021-05-15 MED ORDER — PANTOPRAZOLE SODIUM 40 MG PO TBEC
40.0000 mg | DELAYED_RELEASE_TABLET | Freq: Every day | ORAL | Status: DC
  Administered 2021-05-16: 40 mg via ORAL
  Filled 2021-05-15: qty 1

## 2021-05-15 MED ORDER — PIPERACILLIN-TAZOBACTAM 3.375 G IVPB
3.3750 g | Freq: Three times a day (TID) | INTRAVENOUS | Status: DC
  Administered 2021-05-15 – 2021-05-16 (×3): 3.375 g via INTRAVENOUS
  Filled 2021-05-15 (×3): qty 50

## 2021-05-15 MED ORDER — SODIUM CHLORIDE 0.9 % IV SOLN: 250.0000 mL | INTRAVENOUS | Status: DC | PRN

## 2021-05-15 MED ORDER — ALUM & MAG HYDROXIDE-SIMETH 200-200-20 MG/5ML PO SUSP
30.0000 mL | Freq: Once | ORAL | Status: AC
  Administered 2021-05-15: 30 mL via ORAL
  Filled 2021-05-15: qty 30

## 2021-05-15 NOTE — ED Triage Notes (Signed)
Hx of GERD. She has had RUQ pain x 2 weeks. She is here today with epigastric pain yesterday and again today. EKG done at triage.

## 2021-05-15 NOTE — H&P (Signed)
Samantha Roberson FVC:[id number] DOB: 18-Oct-1961 DOA: 05/15/2021     PCP: Glendon Axe, MD   Outpatient Specialists:    Urology Dr.  PAce  Patient arrived to ER on 05/15/21 at 1355 Referred by Attending Mansy, Arvella Merles, MD   Patient coming from: home Lives  With family    Chief Complaint:    Chief Complaint  Patient presents with  . Chest Pain    HPI: Samantha Roberson is a 60 y.o. female with medical history significant of renal mass status post right nephrectomy, asthma, HTN, thyroid nodule    Presented with   upper abdominal pain for almost a month but gotten really worse yesterday.  After she ate pain was getting worse.  Try to use some antiacid and that seemed to help.  And gotten worse after she ate again.  On arrival to [place] pain 10 out of 10. No associated nausea vomiting or diarrhea or URI symptoms    Has been vaccinated against COVID  and boosted   Initial COVID TEST  NEGATIVE   Lab Results  Component Value Date   SARSCOV2NAA NEGATIVE 05/15/2021   [place] NEGATIVE 11/11/2020   [place] Not Detected 07/01/2019     Regarding pertinent Chronic problems:      HTN on metoprolol  GERD on Protonix    obesity-   BMI Readings from Last 1 Encounters:  05/15/21 33.23 kg/m      CKD stage IIIa- baseline Cr1.1 Estimated Creatinine Clearance: 70.3 mL/min (A) (by C-G formula based on SCr of 1.07 mg/dL (H)).  Lab Results  Component Value Date   CREATININE 1.07 (H) 05/15/2021   CREATININE 1.03 (H) 11/17/2020   CREATININE 1.06 (H) 11/16/2020      While in ER: Noted to have elevated LFTs [place] worrisome for choledocholithiasis vs cholecystitis    ED Triage Vitals  Enc Vitals Group     BP 05/15/21 1404 (!) 153/98     Pulse Rate 05/15/21 1404 98     Resp 05/15/21 1404 16     Temp 05/15/21 1404 98.4 F (36.9 C)     Temp Source 05/15/21 1404 Oral     SpO2 05/15/21 1404 98 %     Weight 05/15/21 1400 225 lb (102.1 kg)     Height  05/15/21 1400 5\' 9"  (1.753 m)     Head Circumference --      Peak Flow --      Pain Score 05/15/21 1400 8     Pain Loc --      Pain Edu? --      Excl. in [place]? --   TMAX(24)@     _________________________________________ Significant initial  Findings: Abnormal Labs Reviewed  COMPREHENSIVE METABOLIC PANEL - Abnormal; Notable for the following components:      Result Value   Glucose, Bld 114 (*)    Creatinine, Ser 1.07 (*)    AST 69 (*)    ALT 83 (*)    Alkaline Phosphatase 163 (*)    GFR, Estimated 59 (*)    All other components within normal limits  CBC WITH DIFFERENTIAL/PLATELET - Abnormal; Notable for the following components:   Eosinophils Absolute 0.6 (*)    All other components within normal limits  URINALYSIS, ROUTINE W REFLEX MICROSCOPIC - Abnormal; Notable for the following components:   Specific Gravity, Urine <1.005 (*)    All other components within normal limits   ____________________________________________ Ordered    CXR -  cardiomegaly  CTabd/pelvis -  Cholelithiasis.  Status post right nephrectomy.  No abnormal soft tissue in the nephrectomy bed.  Stable small nodular densities along the right psoas muscle as seen on prior CT.     Ultrasound of the right upper quadrant shows cholelithiasis.  Equivocal findings for acute cholecystitis.  There is focal irregular gallbladder wall thickening near the neck of the gallbladder.  This could be further evaluated by nonemergent contrast-enhanced MRI of the abdomen to exclude infiltrative lesion.  Mildly enlarged common bile duct with questionable choledocholithiasis.   ECG: Ordered Personally reviewed by me showing: HR :87  Rhythm: NSR,   no evidence of ischemic changes QTC 423 ______________  The recent clinical data is shown below. Vitals:   05/15/21 1715 05/15/21 1745 05/15/21 1945 05/15/21 2109  BP: (!) 149/98 (!) 144/88 130/77 (!) 147/77  Pulse: 77 76 80 71  Resp:  18  17  Temp:    97.8 F (36.6 C)   TempSrc:    Oral  SpO2: 100% 100% 95% 98%  Weight:      Height:         WBC     Component Value Date/Time   WBC 9.5 05/15/2021 1459   LYMPHSABS 1.8 05/15/2021 1459   MONOABS 0.6 05/15/2021 1459   EOSABS 0.6 (H) 05/15/2021 1459   BASOSABS 0.1 05/15/2021 1459     UA  no evidence of UTI      Urine analysis:    Component Value Date/Time   COLORURINE YELLOW 05/15/2021 1459   APPEARANCEUR CLEAR 05/15/2021 1459   LABSPEC <1.005 (L) 05/15/2021 1459   PHURINE 6.0 05/15/2021 1459   GLUCOSEU NEGATIVE 05/15/2021 1459   HGBUR NEGATIVE 05/15/2021 1459   BILIRUBINUR NEGATIVE 05/15/2021 1459   KETONESUR NEGATIVE 05/15/2021 1459   PROTEINUR NEGATIVE 05/15/2021 1459   NITRITE NEGATIVE 05/15/2021 1459   LEUKOCYTESUR NEGATIVE 05/15/2021 1459    Results for orders placed or performed during the hospital encounter of 05/15/21  Resp Panel by RT-PCR (Flu A&B, Covid) Nasopharyngeal Swab     Status: None   Collection Time: 05/15/21  7:03 PM   Specimen: Nasopharyngeal Swab; Nasopharyngeal(NP) swabs in vial transport medium  Result Value Ref Range Status   SARS Coronavirus 2 by RT PCR NEGATIVE NEGATIVE Final         Influenza A by PCR NEGATIVE NEGATIVE Final   Influenza B by PCR NEGATIVE NEGATIVE Final          _______________________________________________________ ER Provider Called:   [facility]  Dr. Therisa Doyne They Recommend admit to medicine  received both an MRI with contrast as well as an MRCP due to the possibility of an infiltrative lesion. Will see in AM     ER Provider Called:   [facility] Dr. Georgette Dover  They Recommend admit to medicine  received both an MRI with contrast as well as an MRCP due to the possibility of an infiltrative lesion. Will see in AM    _______________________________________________ Hospitalist was called for admission for Choledocholethiasis vs cholecystitis  The following Work up has been ordered so far:  Orders Placed This Encounter  Procedures  . Resp Panel  by RT-PCR (Flu A&B, Covid) Nasopharyngeal Swab  . DG Chest Portable 1 View  . CT ABDOMEN PELVIS WO CONTRAST  . US Abdomen Limited RUQ (LIVER/GB)  . Comprehensive metabolic panel  . CBC with Differential  . Lipase, blood  . Urinalysis, Routine w reflex microscopic  . Consult to gastroenterology  . Consult to general surgery  .  Consult to hospitalist  . Airborne and Contact precautions  . ED EKG  . EKG 12-Lead  . Saline lock IV  . Admit to Inpatient (patient's expected length of stay will be greater than 2 midnights or inpatient only procedure)    Following Medications were ordered in ER: Medications  sodium chloride 0.9 % bolus 1,000 mL ( Intravenous Stopped 05/15/21 1725)  morphine 4 MG/ML injection 4 mg (4 mg Intravenous Given 05/15/21 1529)  alum & mag hydroxide-simeth (MAALOX/MYLANTA) 200-200-20 MG/5ML suspension 30 mL (30 mLs Oral Given 05/15/21 1534)  pantoprazole (PROTONIX) injection 40 mg (40 mg Intravenous Given 05/15/21 1532)  morphine 2 MG/ML injection 2 mg (2 mg Intravenous Given 05/15/21 1622)        Consult Orders  (From admission, onward)         Start     Ordered   05/15/21 1848  Consult to hospitalist  Called [facility] spoke with Abbe [place]  Once       Provider:  (Not yet assigned)  Question Answer Comment  Place call to: [facility]   Reason for Consult Admit      05/15/21 1848          OTHER Significant initial  Findings:  labs showing:    Recent Labs  Lab 05/15/21 1459  NA 137  K 3.5  CO2 26  GLUCOSE 114*  BUN 16  CREATININE 1.07*  CALCIUM 9.2    Cr  stable,    Lab Results  Component Value Date   CREATININE 1.07 (H) 05/15/2021   CREATININE 1.03 (H) 11/17/2020   CREATININE 1.06 (H) 11/16/2020    Recent Labs  Lab 05/15/21 1459  AST 69*  ALT 83*  ALKPHOS 163*  BILITOT 0.8  PROT 6.9  ALBUMIN 3.6   Lab Results  Component Value Date   CALCIUM 9.2 05/15/2021      Plt: Lab Results  Component Value Date   PLT 242 05/15/2021         Recent Labs  Lab 05/15/21 1459  WBC 9.5  NEUTROABS 6.4  HGB 13.8  HCT 41.2  MCV 91.4  PLT 242    HG/HCT   stable,      Component Value Date/Time   HGB 13.8 05/15/2021 1459   HCT 41.2 05/15/2021 1459   MCV 91.4 05/15/2021 1459      Recent Labs  Lab 05/15/21 1459  LIPASE 35     Cultures: No results found for: SDES, SPECREQUEST, CULT, REPTSTATUS   Radiological Exams on Admission: CT ABDOMEN PELVIS WO CONTRAST  Result Date: 05/15/2021 CLINICAL DATA:  60 year old female with epigastric pain EXAM: CT ABDOMEN AND PELVIS WITHOUT CONTRAST TECHNIQUE: Multidetector CT imaging of the abdomen and pelvis was performed following the standard protocol without IV contrast. COMPARISON:  Head CT abdomen pelvis dated 10/18/2020. FINDINGS: Evaluation of this exam is limited in the absence of intravenous contrast. Lower chest: The visualized lung bases are clear. No intra-abdominal free air or free fluid. Hepatobiliary: Small subcapsular hypodense lesion along the inferior right lobe of the liver, indeterminate but similar to prior CT. No intrahepatic biliary dilatation. There is a small gallstone. No pericholecystic fluid or evidence of acute cholecystitis by CT. Pancreas: Unremarkable. No pancreatic ductal dilatation or surrounding inflammatory changes. Spleen: Normal in size without focal abnormality. Adrenals/Urinary Tract: The adrenal glands are unremarkable. Status post prior right nephrectomy. No abnormal soft tissue noted in the nephrectomy bed. There is no hydronephrosis or nephrolithiasis on the left. The left ureter and urinary  bladder appear unremarkable. Stomach/Bowel: There is sigmoid diverticulosis and scattered colonic diverticula without active inflammatory changes. There is no bowel obstruction or active inflammation. The appendix is normal. Vascular/Lymphatic: The abdominal aorta and IVC are unremarkable. No portal venous gas. There is no adenopathy. Similar appearance of small  nodular density along the right psoas muscle as seen on the prior CT. Reproductive: Hysterectomy. No adnexal masses. Other: Postsurgical changes and scarring of the soft tissues the right lower anterior abdominal wall. No fluid collection. Musculoskeletal: Mild degenerative changes. No acute osseous pathology. IMPRESSION: 1. No acute intra-abdominal or pelvic pathology. 2. Colonic diverticulosis. No bowel obstruction. Normal appendix. 3. Cholelithiasis. 4. Status post prior right nephrectomy. No abnormal soft tissue in the nephrectomy bed. 5. Stable small nodular densities along the right psoas muscle as seen on the prior CT. Attention on follow-up imaging recommended. Electronically Signed   By: Anner Crete M.D.   On: 05/15/2021 15:10   DG Chest Portable 1 View  Result Date: 05/15/2021 CLINICAL DATA:  Chest pain EXAM: PORTABLE CHEST 1 VIEW COMPARISON:  10/20/2020 FINDINGS: Cardiomegaly. Both lungs are clear. The visualized skeletal structures are unremarkable. IMPRESSION: Cardiomegaly without acute abnormality of the lungs in AP portable projection. Electronically Signed   By: Eddie Candle M.D.   On: 05/15/2021 15:01   US Abdomen Limited RUQ (LIVER/GB)  Result Date: 05/15/2021 CLINICAL DATA:  Right upper quadrant pain for 2 weeks. EXAM: ULTRASOUND ABDOMEN LIMITED RIGHT UPPER QUADRANT COMPARISON:  Body CT from the same day. FINDINGS: Gallbladder: Gallbladder calculi the largest measuring 7.5 mm. Gallbladder wall measures 2.6 cm, upper limits of normal. Irregular focal gallbladder wall thickening is noted near the neck of the gallbladder. Positive sonographic Murphy sign noted by sonographer. Common bile duct: Diameter: 7.5 mm. Tiny echogenic focus was seen within the extrahepatic common bile duct, query choledocholithiasis. Liver: No focal lesion identified. Within normal limits in parenchymal echogenicity. Portal vein is patent on color Doppler imaging with normal direction of blood flow towards the  liver. Other: None. IMPRESSION: Cholelithiasis.  Equivocal findings for acute cholecystitis. Focal irregular gallbladder wall thickening near the neck of the gallbladder. This could be further evaluated with nonemergent contrast enhanced MRI of the abdomen to exclude infiltrative lesion. Mildly enlarged common bile duct with questionable choledocholithiasis. Electronically Signed   By: Fidela Salisbury M.D.   On: 05/15/2021 17:19   _______________________________________________________________________________________________________ Latest  Blood pressure (!) 147/77, pulse 71, temperature 97.8 F (36.6 C), temperature source Oral, resp. rate 17, height 5\' 9"  (1.753 m), weight 102.1 kg, SpO2 98 %.   Review of Systems:    Pertinent positives include: abdominal pain,  Constitutional:  No weight loss, night sweats, Fevers, chills, fatigue, weight loss  HEENT:  No headaches, Difficulty swallowing,Tooth/dental problems,Sore throat,  No sneezing, itching, ear ache, nasal congestion, post nasal drip,  Cardio-vascular:  No chest pain, Orthopnea, PND, anasarca, dizziness, palpitations.no Bilateral lower extremity swelling  GI:  No heartburn, indigestion,  nausea, vomiting, diarrhea, change in bowel habits, loss of appetite, melena, blood in stool, hematemesis Resp:  no shortness of breath at rest. No dyspnea on exertion, No excess mucus, no productive cough, No non-productive cough, No coughing up of blood.No change in color of mucus.No wheezing. Skin:  no rash or lesions. No jaundice GU:  no dysuria, change in color of urine, no urgency or frequency. No straining to urinate.  No flank pain.  Musculoskeletal:  No joint pain or no joint swelling. No decreased range of motion. No back pain.  Psych:  No change in mood or affect. No depression or anxiety. No memory loss.  Neuro: no localizing neurological complaints, no tingling, no weakness, no double vision, no gait abnormality, no slurred  speech, no confusion  All systems reviewed and apart from [place] all are negative _______________________________________________________________________________________________ Past Medical History:   Past Medical History:  Diagnosis Date  . Asthma    Allergy induced  . Hypertension    No meds  . Leg pain   . Sinusitis   . Thyroid nodule    Watching for 5 years  . Tonsillitis       Past Surgical History:  Procedure Laterality Date  . ABDOMINAL HYSTERECTOMY    . ANAL FISSURECTOMY  1999  . ENDOSCOPIC VEIN LASER TREATMENT    . LAPAROSCOPIC NEPHRECTOMY, HAND ASSISTED Right 11/15/2020   Procedure: HAND ASSISTED LAPAROSCOPIC RADICAL NEPHRECTOMY;  Surgeon: Robley Fries, MD;  Location: [facility];  Service: Urology;  Laterality: Right;  4 HRS  . SHOULDER OPEN ROTATOR CUFF REPAIR     bil and right shoulder also had bicep repair  . TONSILLECTOMY      Social History:  Ambulatory   independently       reports that she has never smoked. She has never used smokeless tobacco. She reports previous alcohol use of about 4.0 standard drinks of alcohol per week. She reports that she does not use drugs.   Family History:   Family History  Problem Relation Age of Onset  . Stroke Mother   . Hypertension Mother   . Cancer Father   . Hypertension Father   . Hyperlipidemia Father   . Hypertension Sister    ______________________________________________________________________________________________ Allergies: Allergies  Allergen Reactions  . Benadryl [Diphenhydramine Hcl] Anaphylaxis  . Grass Extracts [Gramineae Pollens] Anaphylaxis    Freshly cut grass closes her throat off     Prior to Admission medications   Medication Sig Start Date End Date Taking? Authorizing Provider  aspirin 81 MG tablet Take 81 mg by mouth daily.   Yes [provider]  Multiple Vitamin (MULTIVITAMIN) capsule Take 1 capsule by mouth daily.    Yes [provider]  furosemide (LASIX) 20  MG tablet Take 20 mg by mouth daily as needed for fluid.    [provider]  oxyCODONE (OXY IR/ROXICODONE) 5 MG immediate release tablet TAKE 1 TABLET BY MOUTH EVERY 4 HOURS AS NEEDED FOR SEVERE PAIN (POST NEPHRECTOMY). 11/17/20 05/16/21  Robley Fries, MD  pantoprazole (PROTONIX) 40 MG tablet Take 1 tablet (40 mg total) by mouth daily. 10/18/20 11/17/20  Margette Fast, MD    ___________________________________________________________________________________________________ Physical Exam: Vitals with BMI 05/15/2021 05/15/2021 05/15/2021  Height - - -  Weight - - -  BMI - - -  Systolic 161 096 045  Diastolic 77 77 88  Pulse 71 80 76     1. General:  in No  Acute distress   Chronically ill  -appearing 2. Psychological: Alert and   Oriented 3. Head/ENT:    Dry Mucous Membranes                          Head Non traumatic, neck supple                            Poor Dentition 4. SKIN:  decreased Skin turgor,  Skin clean Dry and intact no rash 5. Heart: Regular rate and rhythm  no Murmur, no Rub or gallop 6. Lungs:   no wheezes or crackles   7. Abdomen: Soft,  epigastric tender, Non distended obese   bowel sounds present 8. Lower extremities: no clubbing, cyanosis, no  edema 9. Neurologically Grossly intact, moving all 4 extremities equally   10. MSK: Normal range of motion    Chart has been reviewed  ______________________________________________________________________________________________  Assessment/Plan   60 y.o. female with medical history significant of renal mass status post right nephrectomy, asthma, HTN, thyroid nodule  Admitted for possible cholecystitis  Present on Admission: . Acute calculous cholecystitis -as per GI recommendations for the MRI reviewed without contrast as well as MRCP.  GI and general surgery will see in consult tomorrow Given possibility of necrosis started on Zosyn. Clear liquids if able to tolerate  . Elevated LFTs -likely secondary to  post obstruction Continue to follow  . Essential hypertension -resume metoprolol  . CKD (chronic kidney disease), stage III (HCC) status post right nephrectomy currently at baseline continue to follow   -chronic avoid nephrotoxic medications such as NSAIDs, Vanco Zosyn combo,  avoid hypotension, continue to follow renal function  Asthma chronic stable  Other plan as per orders.  DVT prophylaxis:  SCD      Code Status:    Code Status: Prior FULL CODE  as per patient   I had personally discussed CODE STATUS with patient      Family Communication:   Family not at  Bedside    Disposition Plan:     To home once workup is complete and patient is stable   Following barriers for discharge:                                                        Pain controlled with PO medications                              able to transition to PO antibiotics                                                   Will need consultants to evaluate patient prior to discharge                        Consults called: [facility], general Surgery   Admission status:  ED Disposition    ED Disposition Condition [place]: [facility] [[id number]]  Level of Care: Med-Surg [16]  May [facility] patient to [facility] or [facility] if equivalent level of care is available:: Yes  Interfacility transfer: Yes  Covid Evaluation: Asymptomatic Screening Protocol (No Symptoms)  Diagnosis: Acute calculous cholecystitis [557322]  Admitting Physician: Christel Mormon [[id number]]  Attending Physician: Christel Mormon [[id number]]  Estimated length of stay: past midnight tomorrow  Certification:: I certify this patient will need inpatient services for at least 2 midnights         inpatient     I Expect 2 midnight stay secondary to severity of patient's current illness need for inpatient interventions justified by the following:  Severe lab/radiological/exam abnormalities including:  Possible  cholecystitis   .    That are currently affecting medical management.   I expect  patient to be hospitalized for 2 midnights requiring inpatient medical care.  Patient is at high risk for adverse outcome (such as loss of life or disability) if not treated.  Indication for inpatient stay as follows:    severe pain requiring acute inpatient management,  inability to maintain oral hydration    Need for operative/procedural  intervention    Need for IV antibiotics, IV fluids,  IV pain medications,     Level of care      medical floor     Lab Results  Component Value Date   [place] 05/15/2021     Precautions: admitted as   Covid Negative    PPE: Used by the provider:   N95  eye Goggles,  Gloves      Tymar Polyak 05/15/2021, 11:53 PM    [facility]     after 2 AM please page floor coverage PA If 7AM-7PM, please contact the day team taking care of the patient using [email or web address]   Patient was evaluated in the context of the global COVID-19 pandemic, which necessitated consideration that the patient might be at risk for infection with the SARS-CoV-2 virus that causes COVID-19. Institutional protocols and algorithms that pertain to the evaluation of patients at risk for COVID-19 are in a state of rapid change based on information released by regulatory bodies including the CDC and federal and state organizations. These policies and algorithms were followed during the patient's care.

## 2021-05-15 NOTE — ED Provider Notes (Addendum)
[facility] EMERGENCY DEPARTMENT Provider Note   CSN: [id number] Arrival date & time: 05/15/21  1355     History Chief Complaint  Patient presents with  . Chest Pain    Samantha Roberson is a 60 y.o. female.  HPI   Patient is a 60 year old female who presents to the emergency department due to upper abdominal pain.  She states she has been experiencing intermittent abdominal pain for the past month but they began worsening yesterday.  She states that they began after eating a small meal.  She took antacids with no relief.  Over time her symptoms mostly resolved.  She then ate another small meal and her symptoms started once again.  She states her pain is 10/10.  She states that she had similar pain in November of last year and had a CT scan incidentally showing a right renal mass at that time.  She has since had a right nephrectomy performed.  She states her pain radiates upwards through her chest.  Also complains of moderate right flank pain.  Denies any urinary symptoms.  No nausea, vomiting, or diarrhea.  No URI symptoms.     Past Medical History:  Diagnosis Date  . Asthma    Allergy induced  . Hypertension    No meds  . Leg pain   . Sinusitis   . Thyroid nodule    Watching for 5 years  . Tonsillitis     Patient Active Problem List   Diagnosis Date Noted  . Right renal mass 11/15/2020  . Varicose veins of lower extremities with other complications 19/14/7829    Past Surgical History:  Procedure Laterality Date  . ABDOMINAL HYSTERECTOMY    . ANAL FISSURECTOMY  1999  . ENDOSCOPIC VEIN LASER TREATMENT    . LAPAROSCOPIC NEPHRECTOMY, HAND ASSISTED Right 11/15/2020   Procedure: HAND ASSISTED LAPAROSCOPIC RADICAL NEPHRECTOMY;  Surgeon: Robley Fries, MD;  Location: [facility];  Service: Urology;  Laterality: Right;  4 HRS  . SHOULDER OPEN ROTATOR CUFF REPAIR     bil and right shoulder also had bicep repair  . TONSILLECTOMY       OB History   No obstetric  history on file.     Family History  Problem Relation Age of Onset  . Stroke Mother   . Hypertension Mother   . Cancer Father   . Hypertension Father   . Hyperlipidemia Father   . Hypertension Sister     Social History   Tobacco Use  . Smoking status: Never Smoker  . Smokeless tobacco: Never Used  Vaping Use  . Vaping Use: Never used  Substance Use Topics  . Alcohol use: Not Currently    Alcohol/week: 4.0 standard drinks    Types: 4 Standard drinks or equivalent per week    Comment: wine  . Drug use: No   Home Medications Prior to Admission medications   Medication Sig Start Date End Date Taking? Authorizing Provider  aspirin 81 MG tablet Take 81 mg by mouth daily.   Yes [provider]  Multiple Vitamin (MULTIVITAMIN) capsule Take 1 capsule by mouth daily.    Yes [provider]  furosemide (LASIX) 20 MG tablet Take 20 mg by mouth daily as needed for fluid.    [provider]  oxyCODONE (OXY IR/ROXICODONE) 5 MG immediate release tablet TAKE 1 TABLET BY MOUTH EVERY 4 HOURS AS NEEDED FOR SEVERE PAIN (POST NEPHRECTOMY). 11/17/20 05/16/21  Robley Fries, MD  pantoprazole (PROTONIX) 40  MG tablet Take 1 tablet (40 mg total) by mouth daily. 10/18/20 11/17/20  Long, Wonda Olds, MD    Allergies    Benadryl [diphenhydramine hcl] and Grass extracts [gramineae pollens]  Review of Systems   Review of Systems  All other systems reviewed and are negative. Ten systems reviewed and are negative for acute change, except as noted in the HPI.   Physical Exam Updated Vital Signs BP (!) 144/88   Pulse 76   Temp 98.4 F (36.9 C) (Oral)   Resp 18   Ht _0  (1.753 m)   Wt 102.1 kg   SpO2 100%   BMI 33.23 kg/m   Physical Exam Vitals and nursing note reviewed.  Constitutional:      General: She is not in acute distress.    Appearance: Normal appearance. She is well-developed. She is not ill-appearing, toxic-appearing or diaphoretic.  HENT:     Head:  Normocephalic and atraumatic.     Right Ear: External ear normal.     Left Ear: External ear normal.     Nose: Nose normal.     Mouth/Throat:     Mouth: Mucous membranes are moist.     Pharynx: Oropharynx is clear. No oropharyngeal exudate or posterior oropharyngeal erythema.  Eyes:     Extraocular Movements: Extraocular movements intact.  Cardiovascular:     Rate and Rhythm: Normal rate and regular rhythm.     Pulses: Normal pulses.          Radial pulses are 2+ on the right side and 2+ on the left side.     Heart sounds: Normal heart sounds. No murmur heard. No friction rub. No gallop.   Pulmonary:     Effort: Pulmonary effort is normal. No tachypnea, accessory muscle usage or respiratory distress.     Breath sounds: Normal breath sounds. No stridor. No decreased breath sounds, wheezing, rhonchi or rales.  Abdominal:     General: Abdomen is flat.     Palpations: Abdomen is soft.     Tenderness: There is abdominal tenderness.     Comments: Protuberant abdomen that is soft.  Moderate tenderness noted diffusely along the epigastrium and right upper quadrant.  Additional right flank pain.  No left flank pain.  No lower abdominal pain.  Musculoskeletal:        General: Normal range of motion.     Cervical back: Normal range of motion and neck supple. No tenderness.  Skin:    General: Skin is warm and dry.  Neurological:     General: No focal deficit present.     Mental Status: She is alert and oriented to person, place, and time.  Psychiatric:        Mood and Affect: Mood normal.        Behavior: Behavior normal.    ED Results / Procedures / Treatments   Labs (all labs ordered are listed, but only abnormal results are displayed) Labs Reviewed  COMPREHENSIVE METABOLIC PANEL - Abnormal; Notable for the following components:      Result Value   Glucose, Bld 114 (*)    Creatinine, Ser 1.07 (*)    AST 69 (*)    ALT 83 (*)    Alkaline Phosphatase 163 (*)    GFR, Estimated 59 (*)     All other components within normal limits  CBC WITH DIFFERENTIAL/PLATELET - Abnormal; Notable for the following components:   Eosinophils Absolute 0.6 (*)    All other components within normal limits  URINALYSIS, ROUTINE W REFLEX MICROSCOPIC - Abnormal; Notable for the following components:   Specific Gravity, Urine <1.005 (*)    All other components within normal limits  RESP PANEL BY RT-PCR (FLU A&B, COVID) ARPGX2  LIPASE, BLOOD  TROPONIN I (HIGH SENSITIVITY)  TROPONIN I (HIGH SENSITIVITY)   EKG EKG Interpretation  Date/Time:  Monday May 15 2021 14:02:45 EDT Ventricular Rate:  87 PR Interval:  146 QRS Duration: 78 QT Interval:  352 QTC Calculation: 423 R Axis:   34 Text Interpretation: Normal sinus rhythm Cannot rule out Anterior infarct , age undetermined No significant change since last tracing Confirmed by Blanchie Dessert [phone]) on 05/15/2021 2:27:31 PM  Radiology CT ABDOMEN PELVIS WO CONTRAST  Result Date: 05/15/2021 CLINICAL DATA:  60 year old female with epigastric pain EXAM: CT ABDOMEN AND PELVIS WITHOUT CONTRAST TECHNIQUE: Multidetector CT imaging of the abdomen and pelvis was performed following the standard protocol without IV contrast. COMPARISON:  Head CT abdomen pelvis dated 10/18/2020. FINDINGS: Evaluation of this exam is limited in the absence of intravenous contrast. Lower chest: The visualized lung bases are clear. No intra-abdominal free air or free fluid. Hepatobiliary: Small subcapsular hypodense lesion along the inferior right lobe of the liver, indeterminate but similar to prior CT. No intrahepatic biliary dilatation. There is a small gallstone. No pericholecystic fluid or evidence of acute cholecystitis by CT. Pancreas: Unremarkable. No pancreatic ductal dilatation or surrounding inflammatory changes. Spleen: Normal in size without focal abnormality. Adrenals/Urinary Tract: The adrenal glands are unremarkable. Status post prior right nephrectomy. No abnormal  soft tissue noted in the nephrectomy bed. There is no hydronephrosis or nephrolithiasis on the left. The left ureter and urinary bladder appear unremarkable. Stomach/Bowel: There is sigmoid diverticulosis and scattered colonic diverticula without active inflammatory changes. There is no bowel obstruction or active inflammation. The appendix is normal. Vascular/Lymphatic: The abdominal aorta and IVC are unremarkable. No portal venous gas. There is no adenopathy. Similar appearance of small nodular density along the right psoas muscle as seen on the prior CT. Reproductive: Hysterectomy. No adnexal masses. Other: Postsurgical changes and scarring of the soft tissues the right lower anterior abdominal wall. No fluid collection. Musculoskeletal: Mild degenerative changes. No acute osseous pathology. IMPRESSION: 1. No acute intra-abdominal or pelvic pathology. 2. Colonic diverticulosis. No bowel obstruction. Normal appendix. 3. Cholelithiasis. 4. Status post prior right nephrectomy. No abnormal soft tissue in the nephrectomy bed. 5. Stable small nodular densities along the right psoas muscle as seen on the prior CT. Attention on follow-up imaging recommended. Electronically Signed   By: Anner Crete M.D.   On: 05/15/2021 15:10   DG Chest Portable 1 View  Result Date: 05/15/2021 CLINICAL DATA:  Chest pain EXAM: PORTABLE CHEST 1 VIEW COMPARISON:  10/20/2020 FINDINGS: Cardiomegaly. Both lungs are clear. The visualized skeletal structures are unremarkable. IMPRESSION: Cardiomegaly without acute abnormality of the lungs in AP portable projection. Electronically Signed   By: Eddie Candle M.D.   On: 05/15/2021 15:01   US Abdomen Limited RUQ (LIVER/GB)  Result Date: 05/15/2021 CLINICAL DATA:  Right upper quadrant pain for 2 weeks. EXAM: ULTRASOUND ABDOMEN LIMITED RIGHT UPPER QUADRANT COMPARISON:  Body CT from the same day. FINDINGS: Gallbladder: Gallbladder calculi the largest measuring 7.5 mm. Gallbladder wall  measures 2.6 cm, upper limits of normal. Irregular focal gallbladder wall thickening is noted near the neck of the gallbladder. Positive sonographic Murphy sign noted by sonographer. Common bile duct: Diameter: 7.5 mm. Tiny echogenic focus was seen within the extrahepatic common bile duct, query  choledocholithiasis. Liver: No focal lesion identified. Within normal limits in parenchymal echogenicity. Portal vein is patent on color Doppler imaging with normal direction of blood flow towards the liver. Other: None. IMPRESSION: Cholelithiasis.  Equivocal findings for acute cholecystitis. Focal irregular gallbladder wall thickening near the neck of the gallbladder. This could be further evaluated with nonemergent contrast enhanced MRI of the abdomen to exclude infiltrative lesion. Mildly enlarged common bile duct with questionable choledocholithiasis. Electronically Signed   By: Fidela Salisbury M.D.   On: 05/15/2021 17:19   Procedures Procedures   Medications Ordered in ED Medications  sodium chloride 0.9 % bolus 1,000 mL (1,000 mLs Intravenous New Bag/Given 05/15/21 1617)  morphine 4 MG/ML injection 4 mg (4 mg Intravenous Given 05/15/21 1529)  alum & mag hydroxide-simeth (MAALOX/MYLANTA) 200-200-20 MG/5ML suspension 30 mL (30 mLs Oral Given 05/15/21 1534)  pantoprazole (PROTONIX) injection 40 mg (40 mg Intravenous Given 05/15/21 1532)  morphine 2 MG/ML injection 2 mg (2 mg Intravenous Given 05/15/21 1622)    ED Course  I have reviewed the triage vital signs and the nursing notes.  Pertinent labs & imaging results that were available during my care of the patient were reviewed by me and considered in my medical decision making (see chart for details).  Clinical Course as of 05/15/21 1929  Mon May 15, 2021  1608 Patient reassessed.  Still having mild pain but notes that it is significantly improved.  Will provide an additional dose of morphine.  Given the region of her pain, mildly elevated LFTs, as  well as gallstones on CT will follow up with an ultrasound of the right upper quadrant.  Will reassess. [LJ]  1750 I spoke to Dr. Therisa Doyne with [facility] gastroenterology.  She recommends that patient be sent to [facility] or [facility] and received both an MRI with contrast as well as an MRCP due to the possibility of an infiltrative lesion.  She also recommends that we discussed with the surgical team as well. [LJ]  1847 I spoke to Dr. Georgette Dover with general surgery.  Recommends also that we obtain the MRI as well as the MRCP.  States we should admit to medicine and transfer to [facility].  They can evaluate the patient tomorrow based on imaging results. [LJ]    Clinical Course User Index [LJ] Rayna Sexton, PA-C   MDM Rules/Calculators/A&P                          Pt is a 60 y.o. female who presents to the emergency department with upper abdominal pain as well as right flank pain.  Labs: CBC with absolute eosinophils of 0.6. CMP with glucose of 114, creatinine of 1.07, AST of 69, ALT of 83, alk phos 163, GFR 59. UA with a decrease specific gravity. Lipase of 35. Troponin of 3.  Imaging: Chest x-ray shows cardiomegaly without acute abnormality of the lungs. CT scan of the abdomen and pelvis without contrast shows no acute intra-abdominal or pelvic pathology.  They note colonic diverticulosis.  No bowel obstruction.  Normal appendix.  Cholelithiasis.  Status post right nephrectomy.  No abnormal soft tissue in the nephrectomy bed.  Stable small nodular densities along the right psoas muscle as seen on prior CT. Ultrasound of the right upper quadrant shows cholelithiasis.  Equivocal findings for acute cholecystitis.  There is focal irregular gallbladder wall thickening near the neck of the gallbladder.  This could be further evaluated by nonemergent contrast-enhanced MRI of  the abdomen to exclude infiltrative lesion.  Mildly enlarged common bile duct with questionable choledocholithiasis.  I, Rayna Sexton, PA-C, personally reviewed and evaluated these images and lab results as part of my medical decision-making.  Patient discussed with GI who recommends MRI of the abdomen with contrast as well as MRCP.  Also recommended that I follow-up with surgery regarding patient's imaging.  Patient discussed with general surgery who recommends admission to medicine.  Agrees with MRI as well as MRCP.  They state that they can evaluate the patient tomorrow based on results of the imaging if needed.  Will discuss with the medicine team for admission at this time.  GI and surgery request the patient be sent to [facility].  This was discussed with the patient and she is amenable.  Note: Portions of this report may have been transcribed using voice recognition software. Every effort was made to ensure accuracy; however, inadvertent computerized transcription errors may be present.   Final Clinical Impression(s) / ED Diagnoses Final diagnoses:  RUQ pain  Elevated LFTs   Rx / DC Orders ED Discharge Orders    None       Rayna Sexton, PA-C 05/15/21 1856    Rayna Sexton, PA-C 05/15/21 1929    Wyvonnia Dusky, MD 05/16/21 [phone]

## 2021-05-15 NOTE — Progress Notes (Signed)
Pharmacy Antibiotic Note  NUALA CHILES is a 60 y.o. female admitted on 05/15/2021 with upper abdominal pain.Marland Kitchen  Pharmacy has been consulted to dose zosyn for intra-abdominal infection  Plan: Zosyn 3.375g IV Q8H infused over 4hrs. Pharmacy will sign off and follow peripherally  Height: 5\' 9"  (175.3 cm) Weight: 102.1 kg (225 lb) IBW/kg (Calculated) : 66.2  Temp (24hrs), Avg:98.1 F (36.7 C), Min:97.8 F (36.6 C), Max:98.4 F (36.9 C)  Recent Labs  Lab 05/15/21 1459  WBC 9.5  CREATININE 1.07*    Estimated Creatinine Clearance: 70.3 mL/min (A) (by C-G formula based on SCr of 1.07 mg/dL (H)).    Allergies  Allergen Reactions  . Benadryl [Diphenhydramine Hcl] Anaphylaxis  . Grass Extracts [Gramineae Pollens] Anaphylaxis    Freshly cut grass closes her throat off     Thank you for allowing pharmacy to be a part of this patient's care.  Dolly Rias RPh 05/15/2021, 10:07 PM

## 2021-05-16 ENCOUNTER — Inpatient Hospital Stay (HOSPITAL_COMMUNITY): Payer: BC Managed Care – PPO

## 2021-05-16 DIAGNOSIS — I517 Cardiomegaly: Secondary | ICD-10-CM

## 2021-05-16 LAB — CBC WITH DIFFERENTIAL/PLATELET
Abs Immature Granulocytes: 0.02 10*3/uL (ref 0.00–0.07)
Basophils Absolute: 0.1 10*3/uL (ref 0.0–0.1)
Basophils Relative: 1 %
Eosinophils Absolute: 0.6 10*3/uL — ABNORMAL HIGH (ref 0.0–0.5)
Eosinophils Relative: 9 %
HCT: 39.4 % (ref 36.0–46.0)
Hemoglobin: 13.1 g/dL (ref 12.0–15.0)
Immature Granulocytes: 0 %
Lymphocytes Relative: 29 %
Lymphs Abs: 2 10*3/uL (ref 0.7–4.0)
MCH: 30.9 pg (ref 26.0–34.0)
MCHC: 33.2 g/dL (ref 30.0–36.0)
MCV: 92.9 fL (ref 80.0–100.0)
Monocytes Absolute: 0.4 10*3/uL (ref 0.1–1.0)
Monocytes Relative: 6 %
Neutro Abs: 3.9 10*3/uL (ref 1.7–7.7)
Neutrophils Relative %: 55 %
Platelets: 194 10*3/uL (ref 150–400)
RBC: 4.24 MIL/uL (ref 3.87–5.11)
RDW: 12.5 % (ref 11.5–15.5)
WBC: 7 10*3/uL (ref 4.0–10.5)
nRBC: 0 % (ref 0.0–0.2)

## 2021-05-16 LAB — COMPREHENSIVE METABOLIC PANEL
ALT: 72 U/L — ABNORMAL HIGH (ref 0–44)
AST: 48 U/L — ABNORMAL HIGH (ref 15–41)
Albumin: 3.2 g/dL — ABNORMAL LOW (ref 3.5–5.0)
Alkaline Phosphatase: 148 U/L — ABNORMAL HIGH (ref 38–126)
Anion gap: 6 (ref 5–15)
BUN: 14 mg/dL (ref 8–23)
CO2: 28 mmol/L (ref 22–32)
Calcium: 9 mg/dL (ref 8.9–10.3)
Chloride: 107 mmol/L (ref 98–111)
Creatinine, Ser: 0.91 mg/dL (ref 0.44–1.00)
GFR, Estimated: >60 mL/min (ref >60)
Glucose, Bld: 92 mg/dL (ref 70–99)
Potassium: 3.8 mmol/L (ref 3.5–5.1)
Sodium: 141 mmol/L (ref 135–145)
Total Bilirubin: 1.6 mg/dL — ABNORMAL HIGH (ref 0.3–1.2)
Total Protein: 6.3 g/dL — ABNORMAL LOW (ref 6.5–8.1)

## 2021-05-16 LAB — TSH: TSH: 2.911 u[IU]/mL (ref 0.350–4.500)

## 2021-05-16 LAB — ECHOCARDIOGRAM COMPLETE
Area-P 1/2: 4.06 cm2
Height: 69 in
S' Lateral: 2.3 cm
Weight: 3600 oz

## 2021-05-16 LAB — HIV ANTIBODY (ROUTINE TESTING W REFLEX): HIV Screen 4th Generation wRfx: NONREACTIVE

## 2021-05-16 LAB — MAGNESIUM: Magnesium: 2.3 mg/dL (ref 1.7–2.4)

## 2021-05-16 LAB — SURGICAL PCR SCREEN
MRSA, PCR: NEGATIVE
Staphylococcus aureus: NEGATIVE

## 2021-05-16 LAB — PHOSPHORUS: Phosphorus: 4.4 mg/dL (ref 2.5–4.6)

## 2021-05-16 MED ORDER — PANTOPRAZOLE SODIUM 40 MG PO TBEC
40.0000 mg | DELAYED_RELEASE_TABLET | Freq: Two times a day (BID) | ORAL | Status: DC
  Administered 2021-05-16 – 2021-05-20 (×8): 40 mg via ORAL
  Filled 2021-05-16 (×8): qty 1

## 2021-05-16 MED ORDER — SODIUM CHLORIDE 0.9 % IV SOLN
2.0000 g | INTRAVENOUS | Status: DC
  Administered 2021-05-16 – 2021-05-19 (×4): 2 g via INTRAVENOUS
  Filled 2021-05-16 (×4): qty 2

## 2021-05-16 MED ORDER — METRONIDAZOLE 500 MG/100ML IV SOLN
500.0000 mg | Freq: Three times a day (TID) | INTRAVENOUS | Status: DC
  Administered 2021-05-16 – 2021-05-20 (×11): 500 mg via INTRAVENOUS
  Filled 2021-05-16 (×11): qty 100

## 2021-05-16 MED ORDER — GADOBUTROL 1 MMOL/ML IV SOLN
10.0000 mL | Freq: Once | INTRAVENOUS | Status: AC | PRN
  Administered 2021-05-16: 7 mL via INTRAVENOUS

## 2021-05-16 MED ORDER — KCL IN DEXTROSE-NACL 20-5-0.45 MEQ/L-%-% IV SOLN
INTRAVENOUS | Status: DC
  Filled 2021-05-16 (×8): qty 1000

## 2021-05-16 NOTE — Consult Note (Signed)
Referring Provider: [facility] Primary Care Physician:  Glendon Axe, MD Primary Gastroenterologist:  Althia Forts ([place])  Reason for Consultation:  Abnormal LFTs, Abnormal RUQ [place]  HPI: Samantha Roberson is a 60 y.o. female with history of right nephrectomy presenting for consultation of abnormal RUQ [place] and abnormal LFTs.  Patient states she has been having intermittent RUQ and epigastric abdominal pain for the past month. However, over the past 2-3 days it worsened and led to severe pain after any meal.  She denies associated nausea/vomiting.  She thinks she has lost some weight but is unsure how much. Denies appetite changes. Denies changes in bowel movements, melena, or hematochezia.  Family history pertinent for mother with gastric cancer.  Mother, son, and sister all required cholecystectomies.  Last colonoscopy 2015 with Dr. Collene Mares.  RUQ [place] 5/30: Cholelithiasis.  Equivocal findings for acute cholecystitis.  Focal irregular gallbladder wall thickening near the neck of the gallbladder. This could be further evaluated with nonemergent contrast enhanced MRI of the abdomen to exclude infiltrative lesion.  Mildly enlarged common bile duct with questionable choledocholithiasis.  Past Medical History:  Diagnosis Date  . Asthma    Allergy induced  . Hypertension    No meds  . Leg pain   . Sinusitis   . Thyroid nodule    Watching for 5 years  . Tonsillitis     Past Surgical History:  Procedure Laterality Date  . ABDOMINAL HYSTERECTOMY    . ANAL FISSURECTOMY  1999  . ENDOSCOPIC VEIN LASER TREATMENT    . LAPAROSCOPIC NEPHRECTOMY, HAND ASSISTED Right 11/15/2020   Procedure: HAND ASSISTED LAPAROSCOPIC RADICAL NEPHRECTOMY;  Surgeon: Robley Fries, MD;  Location: [facility];  Service: Urology;  Laterality: Right;  4 HRS  . SHOULDER OPEN ROTATOR CUFF REPAIR     bil and right shoulder also had bicep repair  . TONSILLECTOMY      Prior to Admission medications   Medication Sig Start  Date End Date Taking? Authorizing Provider  aspirin 81 MG tablet Take 81 mg by mouth daily.   Yes [provider]  Multiple Vitamin (MULTIVITAMIN) capsule Take 1 capsule by mouth daily.    Yes [provider]  furosemide (LASIX) 20 MG tablet Take 20 mg by mouth daily as needed for fluid.    [provider]  oxyCODONE (OXY IR/ROXICODONE) 5 MG immediate release tablet TAKE 1 TABLET BY MOUTH EVERY 4 HOURS AS NEEDED FOR SEVERE PAIN (POST NEPHRECTOMY). 11/17/20 05/16/21  Robley Fries, MD  pantoprazole (PROTONIX) 40 MG tablet Take 1 tablet (40 mg total) by mouth daily. 10/18/20 11/17/20  Margette Fast, MD    Scheduled Meds: . metoprolol tartrate  12.5 mg Oral BID  . pantoprazole  40 mg Oral Daily  . sodium chloride flush  3 mL Intravenous Q12H   Continuous Infusions: . sodium chloride    . piperacillin-tazobactam (ZOSYN)  IV 3.375 g (05/16/21 7371)   PRN Meds:.sodium chloride, acetaminophen **OR** acetaminophen, fentaNYL (SUBLIMAZE) injection, HYDROcodone-acetaminophen, sodium chloride flush  Allergies as of 05/15/2021 - Review Complete 05/15/2021  Allergen Reaction Noted  . Benadryl [diphenhydramine hcl] Anaphylaxis 03/26/2012  . Grass extracts [gramineae pollens] Anaphylaxis 12/14/2018    Family History  Problem Relation Age of Onset  . Stroke Mother   . Hypertension Mother   . Cancer Father   . Hypertension Father   . Hyperlipidemia Father   . Hypertension Sister     Social History   Socioeconomic History  . Marital status: Married  Spouse name: Not on file  . Number of children: Not on file  . Years of education: Not on file  . Highest education level: Not on file  Occupational History  . Not on file  Tobacco Use  . Smoking status: Never Smoker  . Smokeless tobacco: Never Used  Vaping Use  . Vaping Use: Never used  Substance and Sexual Activity  . Alcohol use: Not Currently    Alcohol/week: 4.0 standard drinks    Types: 4 Standard  drinks or equivalent per week    Comment: wine  . Drug use: No  . Sexual activity: Not Currently    Birth control/protection: Surgical  Other Topics Concern  . Not on file  Social History Narrative  . Not on file   Social Determinants of Health   Financial Resource Strain: Not on file  Food Insecurity: Not on file  Transportation Needs: Not on file  Physical Activity: Not on file  Stress: Not on file  Social Connections: Not on file  Intimate Partner Violence: Not on file    Review of Systems: Review of Systems  Constitutional: Negative for chills and fever.  HENT: Negative for hearing loss and tinnitus.   Eyes: Negative for pain and redness.  Respiratory: Negative for cough and shortness of breath.   Cardiovascular: Negative for chest pain and palpitations.  Gastrointestinal: Positive for abdominal pain. Negative for blood in stool, constipation, diarrhea, heartburn, melena, nausea and vomiting.  Genitourinary: Negative for flank pain and hematuria.  Musculoskeletal: Negative for falls and joint pain.  Skin: Negative for itching and rash.  Neurological: Negative for seizures and loss of consciousness.  Endo/Heme/Allergies: Negative for polydipsia. Does not bruise/bleed easily.  Psychiatric/Behavioral: Negative for substance abuse. The patient is not nervous/anxious.    Physical Exam: Vital signs: Vitals:   05/16/21 0107 05/16/21 0519  BP: 131/65 (!) 145/85  Pulse: 60 64  Resp: 17 16  Temp: 98.8 F (37.1 C) 98 F (36.7 C)  SpO2: 95% 98%   Last BM Date: 05/15/21 Physical Exam Vitals reviewed.  Constitutional:      General: She is not in acute distress. HENT:     Head: Normocephalic and atraumatic.     Nose: Nose normal. No congestion.     Mouth/Throat:     Mouth: Mucous membranes are moist.     Pharynx: Oropharynx is clear.  Eyes:     General: No scleral icterus.    Extraocular Movements: Extraocular movements intact.     Conjunctiva/sclera: Conjunctivae  normal.  Cardiovascular:     Rate and Rhythm: Normal rate and regular rhythm.     Pulses: Normal pulses.  Pulmonary:     Effort: Pulmonary effort is normal. No respiratory distress.  Abdominal:     General: There is distension.     Palpations: Abdomen is soft. There is no mass.     Tenderness: There is abdominal tenderness (moderate, epigastric and RUQ). There is guarding. There is no rebound.     Hernia: No hernia is present.  Musculoskeletal:        General: No swelling or tenderness.     Cervical back: Normal range of motion and neck supple.  Skin:    General: Skin is warm and dry.     Coloration: Skin is not jaundiced.  Neurological:     General: No focal deficit present.     Mental Status: She is alert and oriented to person, place, and time.  Psychiatric:  Mood and Affect: Mood normal.        Behavior: Behavior normal. Behavior is cooperative.     GI:  Lab Results: Recent Labs    05/15/21 1459 05/16/21 0412  WBC 9.5 7.0  HGB 13.8 13.1  HCT 41.2 39.4  PLT 242 194   BMET Recent Labs    05/15/21 1459 05/16/21 0412  NA 137 141  K 3.5 3.8  CL 103 107  CO2 26 28  GLUCOSE 114* 92  BUN 16 14  CREATININE 1.07* 0.91  CALCIUM 9.2 9.0   LFT Recent Labs    05/16/21 0412  PROT 6.3*  ALBUMIN 3.2*  AST 48*  ALT 72*  ALKPHOS 148*  BILITOT 1.6*   PT/INR No results for input(s): LABPROT, INR in the last 72 hours.   Studies/Results: CT ABDOMEN PELVIS WO CONTRAST  Result Date: 05/15/2021 CLINICAL DATA:  60 year old female with epigastric pain EXAM: CT ABDOMEN AND PELVIS WITHOUT CONTRAST TECHNIQUE: Multidetector CT imaging of the abdomen and pelvis was performed following the standard protocol without IV contrast. COMPARISON:  Head CT abdomen pelvis dated 10/18/2020. FINDINGS: Evaluation of this exam is limited in the absence of intravenous contrast. Lower chest: The visualized lung bases are clear. No intra-abdominal free air or free fluid. Hepatobiliary:  Small subcapsular hypodense lesion along the inferior right lobe of the liver, indeterminate but similar to prior CT. No intrahepatic biliary dilatation. There is a small gallstone. No pericholecystic fluid or evidence of acute cholecystitis by CT. Pancreas: Unremarkable. No pancreatic ductal dilatation or surrounding inflammatory changes. Spleen: Normal in size without focal abnormality. Adrenals/Urinary Tract: The adrenal glands are unremarkable. Status post prior right nephrectomy. No abnormal soft tissue noted in the nephrectomy bed. There is no hydronephrosis or nephrolithiasis on the left. The left ureter and urinary bladder appear unremarkable. Stomach/Bowel: There is sigmoid diverticulosis and scattered colonic diverticula without active inflammatory changes. There is no bowel obstruction or active inflammation. The appendix is normal. Vascular/Lymphatic: The abdominal aorta and IVC are unremarkable. No portal venous gas. There is no adenopathy. Similar appearance of small nodular density along the right psoas muscle as seen on the prior CT. Reproductive: Hysterectomy. No adnexal masses. Other: Postsurgical changes and scarring of the soft tissues the right lower anterior abdominal wall. No fluid collection. Musculoskeletal: Mild degenerative changes. No acute osseous pathology. IMPRESSION: 1. No acute intra-abdominal or pelvic pathology. 2. Colonic diverticulosis. No bowel obstruction. Normal appendix. 3. Cholelithiasis. 4. Status post prior right nephrectomy. No abnormal soft tissue in the nephrectomy bed. 5. Stable small nodular densities along the right psoas muscle as seen on the prior CT. Attention on follow-up imaging recommended. Electronically Signed   By: Anner Crete M.D.   On: 05/15/2021 15:10   DG Chest Portable 1 View  Result Date: 05/15/2021 CLINICAL DATA:  Chest pain EXAM: PORTABLE CHEST 1 VIEW COMPARISON:  10/20/2020 FINDINGS: Cardiomegaly. Both lungs are clear. The visualized  skeletal structures are unremarkable. IMPRESSION: Cardiomegaly without acute abnormality of the lungs in AP portable projection. Electronically Signed   By: Eddie Candle M.D.   On: 05/15/2021 15:01   US Abdomen Limited RUQ (LIVER/GB)  Result Date: 05/15/2021 CLINICAL DATA:  Right upper quadrant pain for 2 weeks. EXAM: ULTRASOUND ABDOMEN LIMITED RIGHT UPPER QUADRANT COMPARISON:  Body CT from the same day. FINDINGS: Gallbladder: Gallbladder calculi the largest measuring 7.5 mm. Gallbladder wall measures 2.6 cm, upper limits of normal. Irregular focal gallbladder wall thickening is noted near the neck of the gallbladder. Positive sonographic  Murphy sign noted by sonographer. Common bile duct: Diameter: 7.5 mm. Tiny echogenic focus was seen within the extrahepatic common bile duct, query choledocholithiasis. Liver: No focal lesion identified. Within normal limits in parenchymal echogenicity. Portal vein is patent on color Doppler imaging with normal direction of blood flow towards the liver. Other: None. IMPRESSION: Cholelithiasis.  Equivocal findings for acute cholecystitis. Focal irregular gallbladder wall thickening near the neck of the gallbladder. This could be further evaluated with nonemergent contrast enhanced MRI of the abdomen to exclude infiltrative lesion. Mildly enlarged common bile duct with questionable choledocholithiasis. Electronically Signed   By: Fidela Salisbury M.D.   On: 05/15/2021 17:19    Impression: Abnormal LFTs, abnormal RUQ [place]: concern for acute cholecystitis.   -[place] 5/30: Cholelithiasis.  Equivocal findings for acute cholecystitis.  Focal irregular gallbladder wall thickening near the neck of the gallbladder - eval with nonemergent contrast enhanced MRI of the abdomen to exclude infiltrative lesion.  Mildly enlarged common bile duct with questionable choledocholithiasis. -T. Bili 1.6/ AST 48/ ALT 72/ ALP 148 -No leukocytosis (WBCs 7.0)  Plan: MRI/MRCP for further evaluation.   If positive for choledocholithiasis, plan to proceed with ERCP.  If no choledocholithiasis seen, recommend CCS consult for consideration of lap chole with IOC.  Continue to trend LFTs.  Further management as per primary gastroenterologist, Dr. Collene Mares.   LOS: 1 day   Salley Slaughter  PA-C 05/16/2021, 8:37 AM  Contact #  [phone]

## 2021-05-16 NOTE — Progress Notes (Signed)
  Echocardiogram 2D Echocardiogram has been performed.  Samantha Roberson 05/16/2021, 1:30 PM

## 2021-05-16 NOTE — Hospital Course (Signed)
60 year old woman presented with upper abdominal pain for suspected acute calculus cholecystitis, elevated LFTs.  A & P  Suspected acute calculus cholecystitis.  MRI MRCP unrevealing.  No evidence of choledocholithiasis. --Continue antibiotics.  General surgery following.  Elevated LFTs -- Transaminases trending down.  T bilirubin elevated.  May have passed a stone.  MRCP negative today.  Asthma --Stable  Status post right nephrectomy  Stable small nodular densities along the right psoas muscle as seen on the prior CT.  --Attention on follow-up imaging recommended.

## 2021-05-16 NOTE — Consult Note (Addendum)
Consult Note  Samantha Roberson May 19, 1961  [id number].    Requesting MD: Dr. Sarajane Jews Chief Complaint/Reason for Consult: abdominal pain   HPI:  60 yo female with past medical history of hypertension and partial nephrectomy. She presented to [facility] ED due to upper abdominal pain. Work up in the ED significant for CT with cholelithiasis and RUQ [place] with focal irregular gallbladder wall thickening near the neck of the gallbladder.  WBC normal and abnormal LFTs, bilirubin 0.8. She was admitted to the medicine service and we were asked to see. GI has also consulted.  She reports new and ongoing abdominal pain for the last month that acutely worsened over the last 2 days prior to ED presentation. Pain is located in the right upper quadrant and radiates up to her epigastric region. Pain occurs after eating. She has not had fever or chills. She has not had nausea or emesis. She is having regular BMs. Last BM yesterday. She denies urologic complaints. She has lower extremity swelling after standing for long periods of time. None now.  Substance use: none Allergies: benadryl - anaphylaxis Blood thinners: ASA 81 mg Past Surgeries: right nephrectomy 11/15/20, partial hysterectomy  Denies a history of MI, CVA, asthma, or COPD. At baseline states she mobilizes without an assistive device.  ROS: Review of Systems  Constitutional: Negative for chills and fever.  Respiratory: Negative for cough and shortness of breath.   Cardiovascular: Negative for chest pain and palpitations.  Gastrointestinal: Positive for abdominal pain. Negative for constipation, diarrhea, nausea and vomiting.  Genitourinary: Negative.     Family History  Problem Relation Age of Onset  . Stroke Mother   . Hypertension Mother   . Cancer Father   . Hypertension Father   . Hyperlipidemia Father   . Hypertension Sister     Past Medical History:  Diagnosis Date  . Asthma    Allergy induced  . Hypertension     No meds  . Leg pain   . Sinusitis   . Thyroid nodule    Watching for 5 years  . Tonsillitis     Past Surgical History:  Procedure Laterality Date  . ABDOMINAL HYSTERECTOMY    . ANAL FISSURECTOMY  1999  . ENDOSCOPIC VEIN LASER TREATMENT    . LAPAROSCOPIC NEPHRECTOMY, HAND ASSISTED Right 11/15/2020   Procedure: HAND ASSISTED LAPAROSCOPIC RADICAL NEPHRECTOMY;  Surgeon: Robley Fries, MD;  Location: [facility];  Service: Urology;  Laterality: Right;  4 HRS  . SHOULDER OPEN ROTATOR CUFF REPAIR     bil and right shoulder also had bicep repair  . TONSILLECTOMY      Social History:  reports that she has never smoked. She has never used smokeless tobacco. She reports previous alcohol use of about 4.0 standard drinks of alcohol per week. She reports that she does not use drugs.  Allergies:  Allergies  Allergen Reactions  . Benadryl [Diphenhydramine Hcl] Anaphylaxis  . Grass Extracts [Gramineae Pollens] Anaphylaxis    Freshly cut grass closes her throat off    Medications Prior to Admission  Medication Sig Dispense Refill  . aspirin 81 MG tablet Take 81 mg by mouth daily.    . cholecalciferol (VITAMIN D3) 25 MCG (1000 UNIT) tablet Take 1,000 Units by mouth daily.    . furosemide (LASIX) 20 MG tablet Take 20 mg by mouth daily as needed for fluid.    . metoprolol succinate (TOPROL-XL) 25 MG 24 hr tablet Take 25 mg by mouth  daily.    . Multiple Vitamin (MULTIVITAMIN) capsule Take 1 capsule by mouth daily.     Marland Kitchen oxyCODONE (OXY IR/ROXICODONE) 5 MG immediate release tablet TAKE 1 TABLET BY MOUTH EVERY 4 HOURS AS NEEDED FOR SEVERE PAIN (POST NEPHRECTOMY). (Patient taking differently: Take 5 mg by mouth every 4 (four) hours as needed for severe pain.) 20 tablet 0  . pantoprazole (PROTONIX) 40 MG tablet Take 1 tablet (40 mg total) by mouth daily. 30 tablet 0    Blood pressure 132/88, pulse 78, temperature 98 F (36.7 C), temperature source Oral, resp. rate 16, height 5\' 9"  (1.753 m), weight  102.1 kg, SpO2 98 %. Physical Exam:  General: pleasant, WD, female who is laying in bed in NAD HEENT: head is normocephalic, atraumatic.  Sclera are noninjected.  Pupils equal and round.  Ears and nose without any masses or lesions.  Mouth is pink and moist Heart: regular, rate, and rhythm.  Normal s1,s2. No obvious murmurs, gallops, or rubs noted.  Palpable radial and pedal pulses bilaterally Lungs: CTAB, no wheezes, rhonchi, or rales noted.  Respiratory effort nonlabored Abd: soft, +BS, no masses, hernias, or organomegaly. Mild distension. Focal tenderness and guarding to palpation of RUQ and epigastric region. No rebound MS: all 4 extremities are symmetrical with no cyanosis, clubbing, or edema. Skin: warm and dry with no masses, lesions, or rashes Neuro: Cranial nerves 2-12 grossly intact, sensation is normal throughout Psych: A&Ox3 with an appropriate affect.   Results for orders placed or performed during the hospital encounter of 05/15/21 (from the past 48 hour(s))  Comprehensive metabolic panel     Status: Abnormal   Collection Time: 05/15/21  2:59 PM  Result Value Ref Range   Sodium 137 135 - 145 mmol/L   Potassium 3.5 3.5 - 5.1 mmol/L   Chloride 103 98 - 111 mmol/L   CO2 26 22 - 32 mmol/L   Glucose, Bld 114 (H) 70 - 99 mg/dL    Comment: Glucose reference range applies only to samples taken after fasting for at least 8 hours.   BUN 16 8 - 23 mg/dL   Creatinine, Ser 1.07 (H) 0.44 - 1.00 mg/dL   Calcium 9.2 8.9 - 10.3 mg/dL   Total Protein 6.9 6.5 - 8.1 g/dL   Albumin 3.6 3.5 - 5.0 g/dL   AST 69 (H) 15 - 41 U/L   ALT 83 (H) 0 - 44 U/L   Alkaline Phosphatase 163 (H) 38 - 126 U/L   Total Bilirubin 0.8 0.3 - 1.2 mg/dL   GFR, Estimated 59 (L) >60 mL/min    Comment: (NOTE) Calculated using the CKD-EPI Creatinine Equation (2021)    Anion gap 8 5 - 15    Comment: Performed at [facility], [address] [place], [place] [address]  CBC with Differential     Status:  Abnormal   Collection Time: 05/15/21  2:59 PM  Result Value Ref Range   WBC 9.5 4.0 - 10.5 K/uL   RBC 4.51 3.87 - 5.11 MIL/uL   Hemoglobin 13.8 12.0 - 15.0 g/dL   HCT 41.2 36.0 - 46.0 %   MCV 91.4 80.0 - 100.0 fL   MCH 30.6 26.0 - 34.0 pg   MCHC 33.5 30.0 - 36.0 g/dL   RDW 12.4 11.5 - 15.5 %   Platelets 242 150 - 400 K/uL   nRBC 0.0 0.0 - 0.2 %   Neutrophils Relative % 68 %   Neutro Abs 6.4 1.7 - 7.7  K/uL   Lymphocytes Relative 19 %   Lymphs Abs 1.8 0.7 - 4.0 K/uL   Monocytes Relative 6 %   Monocytes Absolute 0.6 0.1 - 1.0 K/uL   Eosinophils Relative 6 %   Eosinophils Absolute 0.6 (H) 0.0 - 0.5 K/uL   Basophils Relative 1 %   Basophils Absolute 0.1 0.0 - 0.1 K/uL   Immature Granulocytes 0 %   Abs Immature Granulocytes 0.03 0.00 - 0.07 K/uL    Comment: Performed at [facility], [address] [place], [place] [address]  Lipase, blood     Status: None   Collection Time: 05/15/21  2:59 PM  Result Value Ref Range   Lipase 35 11 - 51 U/L    Comment: Performed at [facility], [address] [place], [place] [address]  Urinalysis, Routine w reflex microscopic Urine, Clean Catch     Status: Abnormal   Collection Time: 05/15/21  2:59 PM  Result Value Ref Range   Color, Urine YELLOW YELLOW   APPearance CLEAR CLEAR   Specific Gravity, Urine <1.005 (L) 1.005 - 1.030   pH 6.0 5.0 - 8.0   Glucose, UA NEGATIVE NEGATIVE mg/dL   Hgb urine dipstick NEGATIVE NEGATIVE   Bilirubin Urine NEGATIVE NEGATIVE   Ketones, ur NEGATIVE NEGATIVE mg/dL   Protein, ur NEGATIVE NEGATIVE mg/dL   Nitrite NEGATIVE NEGATIVE   Leukocytes,Ua NEGATIVE NEGATIVE    Comment: Microscopic not done on urines with negative protein, blood, leukocytes, nitrite, or glucose < 500 mg/dL. Performed at [facility], [address] [place], [place] [address]   Troponin I (High Sensitivity)     Status: None   Collection Time: 05/15/21  3:00 PM  Result Value Ref Range   Troponin I (High  Sensitivity) 3 <18 ng/L    Comment: (NOTE) Elevated high sensitivity troponin I (hsTnI) values and significant  changes across serial measurements may suggest ACS but many other  chronic and acute conditions are known to elevate hsTnI results.  Refer to the "Links" section for chest pain algorithms and additional  guidance. Performed at [facility], [address] [place], [place] [address]   Resp Panel by RT-PCR (Flu A&B, Covid) Nasopharyngeal Swab     Status: None   Collection Time: 05/15/21  7:03 PM   Specimen: Nasopharyngeal Swab; Nasopharyngeal(NP) swabs in vial transport medium  Result Value Ref Range   SARS Coronavirus 2 by RT PCR NEGATIVE NEGATIVE    Comment: (NOTE) SARS-CoV-2 target nucleic acids are NOT DETECTED.  The SARS-CoV-2 RNA is generally detectable in upper respiratory specimens during the acute phase of infection. The lowest concentration of SARS-CoV-2 viral copies this assay can detect is 138 copies/mL. A negative result does not preclude SARS-Cov-2 infection and should not be used as the sole basis for treatment or other patient management decisions. A negative result may occur with  improper specimen collection/handling, submission of specimen other than nasopharyngeal swab, presence of viral mutation(s) within the areas targeted by this assay, and inadequate number of viral copies(<138 copies/mL). A negative result must be combined with clinical observations, patient history, and epidemiological information. The expected result is Negative.  Fact Sheet for Patients:  EntrepreneurPulse.com.au  Fact Sheet for Healthcare Providers:  IncredibleEmployment.be  This test is no t yet approved or cleared by the [place] FDA and  has been authorized for detection and/or diagnosis of SARS-CoV-2 by FDA under an Emergency Use Authorization (EUA). This EUA  will remain  in effect (meaning this test can be used) for  the duration of the COVID-19 declaration under Section 564(b)(1) of the Act, 21 U.S.C.section 360bbb-3(b)(1), unless the authorization is terminated  or revoked sooner.       Influenza A by PCR NEGATIVE NEGATIVE   Influenza B by PCR NEGATIVE NEGATIVE    Comment: (NOTE) The Xpert Xpress SARS-CoV-2/FLU/RSV plus assay is intended as an aid in the diagnosis of influenza from Nasopharyngeal swab specimens and should not be used as a sole basis for treatment. Nasal washings and aspirates are unacceptable for Xpert Xpress SARS-CoV-2/FLU/RSV testing.  Fact Sheet for Patients: EntrepreneurPulse.com.au  Fact Sheet for Healthcare Providers: IncredibleEmployment.be  This test is not yet approved or cleared by the [place] FDA and has been authorized for detection and/or diagnosis of SARS-CoV-2 by FDA under an Emergency Use Authorization (EUA). This EUA will remain in effect (meaning this test can be used) for the duration of the COVID-19 declaration under Section 564(b)(1) of the Act, 21 U.S.C. section 360bbb-3(b)(1), unless the authorization is terminated or revoked.  Performed at [facility], [address] [place], [place] [address]   Troponin I (High Sensitivity)     Status: None   Collection Time: 05/15/21  9:47 PM  Result Value Ref Range   Troponin I (High Sensitivity) 3 <18 ng/L    Comment: (NOTE) Elevated high sensitivity troponin I (hsTnI) values and significant  changes across serial measurements may suggest ACS but many other  chronic and acute conditions are known to elevate hsTnI results.  Refer to the "Links" section for chest pain algorithms and additional  guidance. Performed at [facility], [place] [address] [place], [place] [address]   HIV Antibody (routine testing w rflx)     Status: None   Collection Time: 05/16/21  4:12 AM  Result Value Ref Range   HIV Screen 4th Generation wRfx Non  Reactive Non Reactive    Comment: Performed at [facility], [place] [address], [place], [place] [address]  Magnesium     Status: None   Collection Time: 05/16/21  4:12 AM  Result Value Ref Range   Magnesium 2.3 1.7 - 2.4 mg/dL    Comment: Performed at [facility], [place] [address], [place], [place] [address]  Phosphorus     Status: None   Collection Time: 05/16/21  4:12 AM  Result Value Ref Range   Phosphorus 4.4 2.5 - 4.6 mg/dL    Comment: Performed at [facility], [place] [address], [place], [place] [address]  CBC WITH DIFFERENTIAL     Status: Abnormal   Collection Time: 05/16/21  4:12 AM  Result Value Ref Range   WBC 7.0 4.0 - 10.5 K/uL   RBC 4.24 3.87 - 5.11 MIL/uL   Hemoglobin 13.1 12.0 - 15.0 g/dL   HCT 39.4 36.0 - 46.0 %   MCV 92.9 80.0 - 100.0 fL   MCH 30.9 26.0 - 34.0 pg   MCHC 33.2 30.0 - 36.0 g/dL   RDW 12.5 11.5 - 15.5 %   Platelets 194 150 - 400 K/uL   nRBC 0.0 0.0 - 0.2 %   Neutrophils Relative % 55 %   Neutro Abs 3.9 1.7 - 7.7 K/uL   Lymphocytes Relative 29 %   Lymphs Abs 2.0 0.7 - 4.0 K/uL   Monocytes Relative 6 %   Monocytes Absolute 0.4 0.1 - 1.0 K/uL   Eosinophils Relative 9 %  Eosinophils Absolute 0.6 (H) 0.0 - 0.5 K/uL   Basophils Relative 1 %   Basophils Absolute 0.1 0.0 - 0.1 K/uL   Immature Granulocytes 0 %   Abs Immature Granulocytes 0.02 0.00 - 0.07 K/uL    Comment: Performed at [facility], [place] [address], [place], [place] [address]  TSH     Status: None   Collection Time: 05/16/21  4:12 AM  Result Value Ref Range   TSH 2.911 0.350 - 4.500 uIU/mL    Comment: Performed by a 3rd Generation assay with a functional sensitivity of <=0.01 uIU/mL. Performed at [facility], [place] [address] [place], [place] [address]   Comprehensive metabolic panel     Status: Abnormal   Collection Time: 05/16/21  4:12 AM  Result Value Ref Range   Sodium 141 135 - 145 mmol/L   Potassium  3.8 3.5 - 5.1 mmol/L   Chloride 107 98 - 111 mmol/L   CO2 28 22 - 32 mmol/L   Glucose, Bld 92 70 - 99 mg/dL    Comment: Glucose reference range applies only to samples taken after fasting for at least 8 hours.   BUN 14 8 - 23 mg/dL   Creatinine, Ser 0.91 0.44 - 1.00 mg/dL   Calcium 9.0 8.9 - 10.3 mg/dL   Total Protein 6.3 (L) 6.5 - 8.1 g/dL   Albumin 3.2 (L) 3.5 - 5.0 g/dL   AST 48 (H) 15 - 41 U/L   ALT 72 (H) 0 - 44 U/L   Alkaline Phosphatase 148 (H) 38 - 126 U/L   Total Bilirubin 1.6 (H) 0.3 - 1.2 mg/dL   GFR, Estimated >60 >60 mL/min    Comment: (NOTE) Calculated using the CKD-EPI Creatinine Equation (2021)    Anion gap 6 5 - 15    Comment: Performed at [facility], [place] [address] [place], [place] [address]   CT ABDOMEN PELVIS WO CONTRAST  Result Date: 05/15/2021 CLINICAL DATA:  60 year old female with epigastric pain EXAM: CT ABDOMEN AND PELVIS WITHOUT CONTRAST TECHNIQUE: Multidetector CT imaging of the abdomen and pelvis was performed following the standard protocol without IV contrast. COMPARISON:  Head CT abdomen pelvis dated 10/18/2020. FINDINGS: Evaluation of this exam is limited in the absence of intravenous contrast. Lower chest: The visualized lung bases are clear. No intra-abdominal free air or free fluid. Hepatobiliary: Small subcapsular hypodense lesion along the inferior right lobe of the liver, indeterminate but similar to prior CT. No intrahepatic biliary dilatation. There is a small gallstone. No pericholecystic fluid or evidence of acute cholecystitis by CT. Pancreas: Unremarkable. No pancreatic ductal dilatation or surrounding inflammatory changes. Spleen: Normal in size without focal abnormality. Adrenals/Urinary Tract: The adrenal glands are unremarkable. Status post prior right nephrectomy. No abnormal soft tissue noted in the nephrectomy bed. There is no hydronephrosis or nephrolithiasis on the left. The left ureter and urinary bladder appear  unremarkable. Stomach/Bowel: There is sigmoid diverticulosis and scattered colonic diverticula without active inflammatory changes. There is no bowel obstruction or active inflammation. The appendix is normal. Vascular/Lymphatic: The abdominal aorta and IVC are unremarkable. No portal venous gas. There is no adenopathy. Similar appearance of small nodular density along the right psoas muscle as seen on the prior CT. Reproductive: Hysterectomy. No adnexal masses. Other: Postsurgical changes and scarring of the soft tissues the right lower anterior abdominal wall. No fluid collection. Musculoskeletal: Mild degenerative changes. No acute osseous pathology. IMPRESSION: 1. No acute intra-abdominal or pelvic pathology. 2. Colonic  diverticulosis. No bowel obstruction. Normal appendix. 3. Cholelithiasis. 4. Status post prior right nephrectomy. No abnormal soft tissue in the nephrectomy bed. 5. Stable small nodular densities along the right psoas muscle as seen on the prior CT. Attention on follow-up imaging recommended. Electronically Signed   By: Anner Crete M.D.   On: 05/15/2021 15:10   DG Chest Portable 1 View  Result Date: 05/15/2021 CLINICAL DATA:  Chest pain EXAM: PORTABLE CHEST 1 VIEW COMPARISON:  10/20/2020 FINDINGS: Cardiomegaly. Both lungs are clear. The visualized skeletal structures are unremarkable. IMPRESSION: Cardiomegaly without acute abnormality of the lungs in AP portable projection. Electronically Signed   By: Eddie Candle M.D.   On: 05/15/2021 15:01   US Abdomen Limited RUQ (LIVER/GB)  Result Date: 05/15/2021 CLINICAL DATA:  Right upper quadrant pain for 2 weeks. EXAM: ULTRASOUND ABDOMEN LIMITED RIGHT UPPER QUADRANT COMPARISON:  Body CT from the same day. FINDINGS: Gallbladder: Gallbladder calculi the largest measuring 7.5 mm. Gallbladder wall measures 2.6 cm, upper limits of normal. Irregular focal gallbladder wall thickening is noted near the neck of the gallbladder. Positive sonographic  Murphy sign noted by sonographer. Common bile duct: Diameter: 7.5 mm. Tiny echogenic focus was seen within the extrahepatic common bile duct, query choledocholithiasis. Liver: No focal lesion identified. Within normal limits in parenchymal echogenicity. Portal vein is patent on color Doppler imaging with normal direction of blood flow towards the liver. Other: None. IMPRESSION: Cholelithiasis.  Equivocal findings for acute cholecystitis. Focal irregular gallbladder wall thickening near the neck of the gallbladder. This could be further evaluated with nonemergent contrast enhanced MRI of the abdomen to exclude infiltrative lesion. Mildly enlarged common bile duct with questionable choledocholithiasis. Electronically Signed   By: Fidela Salisbury M.D.   On: 05/15/2021 17:19      Assessment/Plan HTN H/o R nephrectomy  Possible choledocolithiasis - RUQ abdominal pain with abnormal LFTs and RUQ [place] - GI on consult and MRCP ordered - will follow - WBC normal - await MRCP - if choledocholithiasis then ERCP likely followed by laparoscopic cholecystectomy   FEN: clear liquids ID: [email or web address] VTE: [place], [facility] 05/16/2021, 9:56 AM Please see Amion for pager number during day hours 7:00am-4:30pm

## 2021-05-16 NOTE — Progress Notes (Addendum)
PROGRESS NOTE  LANETA GUERIN WLS:[id number] DOB: 07-Dec-1961 DOA: 05/15/2021 PCP: Glendon Axe, MD  Brief History   60 year old woman presented with upper abdominal pain for suspected acute calculus cholecystitis, elevated LFTs.  A & P  Suspected acute calculus cholecystitis.  MRI MRCP unrevealing.  No evidence of choledocholithiasis. --Continue antibiotics.  General surgery following.  Elevated LFTs -- Transaminases trending down.  T bilirubin elevated.  May have passed a stone.  MRCP negative today.  Asthma --Stable  Status post right nephrectomy  Stable small nodular densities along the right psoas muscle as seen on the prior CT.  --Attention on follow-up imaging recommended.  Disposition Plan:  Discussion:   Status is: Inpatient  Remains inpatient appropriate because:IV treatments appropriate due to intensity of illness or inability to take PO and Inpatient level of care appropriate due to severity of illness  Dispo: The patient is from: Home              Anticipated d/c is to: Home              Patient currently is not medically stable to d/c.   Difficult to place patient No  DVT prophylaxis: SCDs Start: 05/15/21 2151   Code Status: Full Code Level of care: Med-Surg Family Communication: none   Murray Hodgkins, MD  [facility] Direct contact: see [email or web address] (further directions at bottom of note if needed) 7PM-7AM contact night coverage as at bottom of note 05/16/2021, 3:30 PM  LOS: 1 day   Consults:  . GI . General surgery    Procedures:  Marland Kitchen    Micro Data:  .    Antimicrobials:  . Ceftriaxone 5/31 >  . Flagyl 5/31 >   Interval History/Subjective  CC: f/u abd pain  Feels better today, pain controlled  Objective   Vitals:  Vitals:   05/16/21 0905 05/16/21 1207  BP: 132/88 (!) 176/76  Pulse: 78 90  Resp:  17  Temp:  97.9 F (36.6 C)  SpO2:  100%    Exam:  Constitutional:   . Appears calm and comfortable sitting in chair ENMT:   . grossly normal hearing  Respiratory:  . CTA bilaterally, no w/r/r.  . Respiratory effort normal.  Cardiovascular:  . RRR, no m/r/g . 1+ BLE extremity edema left greater than right  Abdomen:  Marland Kitchen Mild upper tenderness Psychiatric:  . Mental status o Mood, affect appropriate  I have personally reviewed the following:   Today's Data  . BMP noted . AST and ALT lower . T bili higher 1.6 . CBC unremarkable  Scheduled Meds: . metoprolol tartrate  12.5 mg Oral BID  . pantoprazole  40 mg Oral BID  . sodium chloride flush  3 mL Intravenous Q12H   Continuous Infusions: . sodium chloride    . cefTRIAXone (ROCEPHIN)  IV    . dextrose 5 % and 0.45 % NaCl with KCl 20 mEq/L    . metronidazole      Principal Problem:   Acute calculous cholecystitis Active Problems:   Elevated LFTs   Essential hypertension   LOS: 1 day   How to contact the [facility] Attending or Consulting provider [place] or covering provider during after hours [place], for this patient?  1. Check the care team in [facility] and look for a) attending/consulting [facility] provider listed and b) the [facility] team listed 2. Log into [email or web address] and use [place]'s universal password to access. If you do not have the password, please contact the  hospital operator. 3. Locate the [facility] provider you are looking for under [facility] and page to a number that you can be directly reached. 4. If you still have difficulty reaching the provider, please page the [facility] (Director on Call) for the Hospitalists listed on amion for assistance.

## 2021-05-17 ENCOUNTER — Inpatient Hospital Stay (HOSPITAL_COMMUNITY): Payer: BC Managed Care – PPO | Admitting: Certified Registered Nurse Anesthetist

## 2021-05-17 ENCOUNTER — Encounter (HOSPITAL_COMMUNITY)
Admission: EM | Disposition: A | Discharge status: Home / Self Care | Payer: Self-pay | Source: Home / Self Care | Attending: Internal Medicine

## 2021-05-17 ENCOUNTER — Encounter (HOSPITAL_COMMUNITY): Payer: Self-pay | Admitting: Internal Medicine

## 2021-05-17 DIAGNOSIS — N1831 Chronic kidney disease, stage 3a: Secondary | ICD-10-CM

## 2021-05-17 DIAGNOSIS — K8000 Calculus of gallbladder with acute cholecystitis without obstruction: Secondary | ICD-10-CM

## 2021-05-17 DIAGNOSIS — I10 Essential (primary) hypertension: Secondary | ICD-10-CM

## 2021-05-17 DIAGNOSIS — R7989 Other specified abnormal findings of blood chemistry: Secondary | ICD-10-CM

## 2021-05-17 HISTORY — PX: CHOLECYSTECTOMY: SHX55

## 2021-05-17 LAB — COMPREHENSIVE METABOLIC PANEL
ALT: 66 U/L — ABNORMAL HIGH (ref 0–44)
AST: 44 U/L — ABNORMAL HIGH (ref 15–41)
Albumin: 3.3 g/dL — ABNORMAL LOW (ref 3.5–5.0)
Alkaline Phosphatase: 140 U/L — ABNORMAL HIGH (ref 38–126)
Anion gap: 5 (ref 5–15)
BUN: 10 mg/dL (ref 8–23)
CO2: 31 mmol/L (ref 22–32)
Calcium: 8.9 mg/dL (ref 8.9–10.3)
Chloride: 104 mmol/L (ref 98–111)
Creatinine, Ser: 1.02 mg/dL — ABNORMAL HIGH (ref 0.44–1.00)
GFR, Estimated: >60 mL/min (ref >60)
Glucose, Bld: 123 mg/dL — ABNORMAL HIGH (ref 70–99)
Potassium: 4.4 mmol/L (ref 3.5–5.1)
Sodium: 140 mmol/L (ref 135–145)
Total Bilirubin: 1.2 mg/dL (ref 0.3–1.2)
Total Protein: 6.5 g/dL (ref 6.5–8.1)

## 2021-05-17 LAB — CBC
HCT: 41.2 % (ref 36.0–46.0)
Hemoglobin: 13.7 g/dL (ref 12.0–15.0)
MCH: 31.1 pg (ref 26.0–34.0)
MCHC: 33.3 g/dL (ref 30.0–36.0)
MCV: 93.4 fL (ref 80.0–100.0)
Platelets: 216 10*3/uL (ref 150–400)
RBC: 4.41 MIL/uL (ref 3.87–5.11)
RDW: 12.5 % (ref 11.5–15.5)
WBC: 6.5 10*3/uL (ref 4.0–10.5)
nRBC: 0 % (ref 0.0–0.2)

## 2021-05-17 SURGERY — LAPAROSCOPIC CHOLECYSTECTOMY WITH INTRAOPERATIVE CHOLANGIOGRAM
Anesthesia: General

## 2021-05-17 MED ORDER — DEXAMETHASONE SODIUM PHOSPHATE 10 MG/ML IJ SOLN
INTRAMUSCULAR | Status: AC
  Filled 2021-05-17: qty 1

## 2021-05-17 MED ORDER — DEXAMETHASONE SODIUM PHOSPHATE 10 MG/ML IJ SOLN
INTRAMUSCULAR | Status: DC | PRN
  Administered 2021-05-17: 4 mg via INTRAVENOUS

## 2021-05-17 MED ORDER — LIDOCAINE 2% (20 MG/ML) 5 ML SYRINGE
INTRAMUSCULAR | Status: AC
  Filled 2021-05-17: qty 5

## 2021-05-17 MED ORDER — BUPIVACAINE-EPINEPHRINE 0.25% -1:200000 IJ SOLN
INTRAMUSCULAR | Status: DC | PRN
  Administered 2021-05-17: 23 mL

## 2021-05-17 MED ORDER — HYDROMORPHONE HCL 1 MG/ML IJ SOLN
0.2500 mg | INTRAMUSCULAR | Status: DC | PRN
  Administered 2021-05-17 (×4): 0.5 mg via INTRAVENOUS

## 2021-05-17 MED ORDER — LACTATED RINGERS IV SOLN: INTRAVENOUS | Status: DC

## 2021-05-17 MED ORDER — HYDRALAZINE HCL 20 MG/ML IJ SOLN: 10.0000 mg | Freq: Once | INTRAMUSCULAR | Status: DC

## 2021-05-17 MED ORDER — ONDANSETRON HCL 4 MG/2ML IJ SOLN
INTRAMUSCULAR | Status: AC
  Filled 2021-05-17: qty 2

## 2021-05-17 MED ORDER — OXYCODONE HCL 5 MG/5ML PO SOLN: 5.0000 mg | Freq: Once | ORAL | Status: DC | PRN

## 2021-05-17 MED ORDER — MIDAZOLAM HCL 5 MG/5ML IJ SOLN
INTRAMUSCULAR | Status: DC | PRN
  Administered 2021-05-17: 2 mg via INTRAVENOUS

## 2021-05-17 MED ORDER — ROCURONIUM BROMIDE 10 MG/ML (PF) SYRINGE
PREFILLED_SYRINGE | INTRAVENOUS | Status: AC
  Filled 2021-05-17: qty 10

## 2021-05-17 MED ORDER — MIDAZOLAM HCL 2 MG/2ML IJ SOLN
INTRAMUSCULAR | Status: AC
  Filled 2021-05-17: qty 2

## 2021-05-17 MED ORDER — FENTANYL CITRATE (PF) 100 MCG/2ML IJ SOLN
INTRAMUSCULAR | Status: AC
  Filled 2021-05-17: qty 2

## 2021-05-17 MED ORDER — LACTATED RINGERS IR SOLN
Status: DC | PRN
  Administered 2021-05-17: 3000 mL

## 2021-05-17 MED ORDER — ONDANSETRON HCL 4 MG/2ML IJ SOLN: 4.0000 mg | Freq: Once | INTRAMUSCULAR | Status: DC | PRN

## 2021-05-17 MED ORDER — KETAMINE HCL 10 MG/ML IJ SOLN
INTRAMUSCULAR | Status: AC
  Filled 2021-05-17: qty 1

## 2021-05-17 MED ORDER — HYDRALAZINE HCL 20 MG/ML IJ SOLN
INTRAMUSCULAR | Status: AC
  Filled 2021-05-17: qty 1

## 2021-05-17 MED ORDER — PHENYLEPHRINE 40 MCG/ML (10ML) SYRINGE FOR IV PUSH (FOR BLOOD PRESSURE SUPPORT)
PREFILLED_SYRINGE | INTRAVENOUS | Status: DC | PRN
  Administered 2021-05-17: 80 ug via INTRAVENOUS

## 2021-05-17 MED ORDER — SUGAMMADEX SODIUM 200 MG/2ML IV SOLN
INTRAVENOUS | Status: DC | PRN
  Administered 2021-05-17: 400 mg via INTRAVENOUS

## 2021-05-17 MED ORDER — PROPOFOL 10 MG/ML IV BOLUS
INTRAVENOUS | Status: AC
  Filled 2021-05-17: qty 20

## 2021-05-17 MED ORDER — LIDOCAINE 2% (20 MG/ML) 5 ML SYRINGE
INTRAMUSCULAR | Status: DC | PRN
  Administered 2021-05-17: 100 mg via INTRAVENOUS

## 2021-05-17 MED ORDER — KETAMINE HCL 10 MG/ML IJ SOLN
INTRAMUSCULAR | Status: DC | PRN
  Administered 2021-05-17: 20 mg via INTRAVENOUS

## 2021-05-17 MED ORDER — HYDROMORPHONE HCL 1 MG/ML IJ SOLN
INTRAMUSCULAR | Status: AC
  Filled 2021-05-17: qty 1

## 2021-05-17 MED ORDER — OXYCODONE HCL 5 MG PO TABS: 5.0000 mg | ORAL_TABLET | Freq: Once | ORAL | Status: DC | PRN

## 2021-05-17 MED ORDER — ONDANSETRON HCL 4 MG/2ML IJ SOLN
4.0000 mg | Freq: Four times a day (QID) | INTRAMUSCULAR | Status: DC | PRN
  Administered 2021-05-17 – 2021-05-18 (×2): 4 mg via INTRAVENOUS
  Filled 2021-05-17 (×2): qty 2

## 2021-05-17 MED ORDER — ROCURONIUM BROMIDE 10 MG/ML (PF) SYRINGE
PREFILLED_SYRINGE | INTRAVENOUS | Status: DC | PRN
  Administered 2021-05-17: 80 mg via INTRAVENOUS

## 2021-05-17 MED ORDER — FENTANYL CITRATE (PF) 250 MCG/5ML IJ SOLN
INTRAMUSCULAR | Status: DC | PRN
  Administered 2021-05-17 (×2): 50 ug via INTRAVENOUS

## 2021-05-17 MED ORDER — PROPOFOL 10 MG/ML IV BOLUS
INTRAVENOUS | Status: DC | PRN
  Administered 2021-05-17: 200 mg via INTRAVENOUS

## 2021-05-17 SURGICAL SUPPLY — 42 items
ADH SKN CLS APL DERMABOND .7 (GAUZE/BANDAGES/DRESSINGS) ×1
APL PRP STRL LF DISP 70% ISPRP (MISCELLANEOUS) ×1
APPLIER CLIP 5 13 M/L LIGAMAX5 (MISCELLANEOUS) ×2
APR CLP MED LRG 5 ANG JAW (MISCELLANEOUS) ×1
BAG SPEC RTRVL LRG 6X4 10 (ENDOMECHANICALS) ×1
CABLE HIGH FREQUENCY MONO STRZ (ELECTRODE) ×1 IMPLANT
CHLORAPREP W/TINT 26 (MISCELLANEOUS) ×2 IMPLANT
CLIP APPLIE 5 13 M/L LIGAMAX5 (MISCELLANEOUS) ×1 IMPLANT
COVER MAYO STAND STRL (DRAPES) IMPLANT
COVER SURGICAL LIGHT HANDLE (MISCELLANEOUS) ×3 IMPLANT
COVER WAND RF STERILE (DRAPES) IMPLANT
DERMABOND ADVANCED (GAUZE/BANDAGES/DRESSINGS) ×1
DERMABOND ADVANCED .7 DNX12 (GAUZE/BANDAGES/DRESSINGS) ×1 IMPLANT
DEVICE TROCAR PUNCTURE CLOSURE (ENDOMECHANICALS) IMPLANT
DRAPE C-ARM 42X120 X-RAY (DRAPES) IMPLANT
DRAPE LAPAROSCOPIC ABDOMINAL (DRAPES) ×2 IMPLANT
ELECT REM PT RETURN 15FT ADLT (MISCELLANEOUS) ×2 IMPLANT
GLOVE SURG ENC MOIS LTX SZ6.5 (GLOVE) ×7 IMPLANT
GLOVE SURG UNDER POLY LF SZ7 (GLOVE) ×2 IMPLANT
GOWN STRL REUS W/TWL XL LVL3 (GOWN DISPOSABLE) ×7 IMPLANT
IRRIG SUCT STRYKERFLOW 2 WTIP (MISCELLANEOUS) ×2
IRRIGATION SUCT STRKRFLW 2 WTP (MISCELLANEOUS) ×1 IMPLANT
IV CATH 14GX2 1/4 (CATHETERS) ×1 IMPLANT
KIT BASIN OR (CUSTOM PROCEDURE TRAY) ×2 IMPLANT
KIT TURNOVER KIT A (KITS) ×2 IMPLANT
PENCIL SMOKE EVACUATOR (MISCELLANEOUS) IMPLANT
POUCH SPECIMEN RETRIEVAL 10MM (ENDOMECHANICALS) ×2 IMPLANT
SCISSORS LAP 5X35 DISP (ENDOMECHANICALS) ×2 IMPLANT
SET CHOLANGIOGRAPH MIX (MISCELLANEOUS) ×1 IMPLANT
SET TUBE SMOKE EVAC HIGH FLOW (TUBING) ×2 IMPLANT
SLEEVE XCEL OPT CAN 5 100 (ENDOMECHANICALS) ×4 IMPLANT
SUT VIC AB 2-0 SH 27 (SUTURE)
SUT VIC AB 2-0 SH 27X BRD (SUTURE) IMPLANT
SUT VIC AB 4-0 PS2 18 (SUTURE) ×2 IMPLANT
SUT VIC AB 4-0 PS2 27 (SUTURE) ×1 IMPLANT
SUT VICRYL 0 UR6 27IN ABS (SUTURE) ×1 IMPLANT
TOWEL OR 17X26 10 PK STRL BLUE (TOWEL DISPOSABLE) ×2 IMPLANT
TOWEL OR NON WOVEN STRL DISP B (DISPOSABLE) ×2 IMPLANT
TRAY LAPAROSCOPIC (CUSTOM PROCEDURE TRAY) ×2 IMPLANT
TROCAR BLADELESS OPT 5 100 (ENDOMECHANICALS) ×2 IMPLANT
TROCAR XCEL BLUNT TIP 100MML (ENDOMECHANICALS) ×1 IMPLANT
TROCAR XCEL NON-BLD 11X100MML (ENDOMECHANICALS) IMPLANT

## 2021-05-17 NOTE — Progress Notes (Signed)
PROGRESS NOTE    Samantha Roberson  CVE:[id number] DOB: June 28, 1961 DOA: 05/15/2021 PCP: Glendon Axe, MD   Brief Narrative:  The patient is a 60 year old obese Caucasian female with a past medical history significant for but not limited to asthma, history of status post right nephrectomy as well as other comorbidities who presented with abdominal pain and was worked up and found to have suspected acute calculus cholecystitis and elevated LFTs.  GI and general surgery were both consulted and MRCP was done which showed a tiny gallstone near the gallbladder fundus with no biliary ductal dilatation and no filling defect to the ampulla.  The plan is for laparoscopic cholecystectomy today with IOC and by general surgery.  She remains n.p.o. at this time  Assessment & Plan:   Principal Problem:   Acute calculous cholecystitis Active Problems:   Elevated LFTs   Essential hypertension  Suspected Acute Calculus Cholecystitis  -Presented with abdominal pain -General surgery was consulted and plans for laparoscopic cholecystectomy today with IOC later -Continue supportive care with antiemetics -She is currently n.p.o. and will defer diet advancement to general surgery -Continue with pain control acetaminophen 650 mg p.o. every 6 as needed/RC, hydrocodone-acetaminophen 1-2 tabs p.o. every 4 as needed for moderate pain, and IV fentanyl 12.5 to 50 mcg every 2 as needed severe pain -Continue with antibiotic coverage with IV ceftriaxone/Flagyl -Continue with pantoprazole 40 mg p.o. twice daily for GERD -Continuing IV fluid hydration with D5 half-normal saline +20 mEQ of KCl at 75 MLS per hour  Abnormal LFT's/Elevated LFT's -Trending down now -MRCP done and showed a tiny gallstone near the gallbladder fundus with no biliary ductal dilatation or filling defect to the ampulla -Current plan is for surgical intervention for laparoscopic cholecystectomy today.  GERD -C/w Pantoprazole 40 mg po  BID  Asthma  -Allergy Induced -Stable -Continue to Monitor as she is not in Exacerbation  S/p Right Nephrectomy -BUN/Cr is relatively stable and gone from 16/1.07 -> 14/0.91 -> 10/1.02 -Avoid further nephrotoxic medications, contrast dyes, hypotension and renally dose medications Can repeat CMP in a.m.  Small Nodular Densisties  -Noted along the Right Psoas Muscle and was seen on prior CT -Continue to Follow as an outpatient and have attention on follow up as an outpatient  Obesity -Complicates overall prognosis and care -Estimated body mass index is 33.23 kg/m as calculated from the following:   Height as of this encounter: 5\' 9"  (1.753 m).   Weight as of this encounter: 102.1 kg. -Weight Loss and Dietary Counseling given   DVT prophylaxis: SCDs Code Status: FULL CODE  Family Communication: No family present at bedside  Disposition Plan: Pending further Surgical evaluation and Clearance   Status is: Inpatient  Remains inpatient appropriate because:Unsafe d/c plan, IV treatments appropriate due to intensity of illness or inability to take PO and Inpatient level of care appropriate due to severity of illness   Dispo: The patient is from: Home              Anticipated d/c is to: Home              Patient currently is not medically stable to d/c.   Difficult to place patient No   Consultants:   Gastroenterology  General Surgery   Procedures:  MRI/MRCP  Antimicrobials:  Anti-infectives (From admission, onward)   Start     Dose/Rate Route Frequency Ordered Stop   05/16/21 2200  cefTRIAXone (ROCEPHIN) 2 g in sodium chloride 0.9 % 100 mL IVPB  2 g 200 mL/hr over 30 Minutes Intravenous Every 24 hours 05/16/21 1527     05/16/21 2200  metroNIDAZOLE (FLAGYL) IVPB 500 mg        500 mg 100 mL/hr over 60 Minutes Intravenous Every 8 hours 05/16/21 1527     05/15/21 2300  piperacillin-tazobactam (ZOSYN) IVPB 3.375 g  Status:  Discontinued        3.375 g 12.5 mL/hr  over 240 Minutes Intravenous Every 8 hours 05/15/21 2205 05/16/21 1527        Subjective: Seen and examined at bedside and she is doing okay this morning and states her abdominal pain had improved but then later on afternoon had some nausea.  Denies any lightheadedness or dizziness.  No other concerns or complaints at this time.  Objective: Vitals:   05/16/21 0905 05/16/21 1207 05/16/21 2055 05/17/21 0418  BP: 132/88 (!) 176/76 130/77 121/63  Pulse: 78 90 63 (!) 57  Resp:  17 16 16   Temp:  97.9 F (36.6 C) (!) 97.5 F (36.4 C) 97.6 F (36.4 C)  TempSrc:   Oral Oral  SpO2:  100% (!) 87% 98%  Weight:      Height:        Intake/Output Summary (Last 24 hours) at 05/17/2021 5284 Last data filed at 05/17/2021 0600 Gross per 24 hour  Intake 1298.78 ml  Output 3100 ml  Net -1801.22 ml   Filed Weights   05/15/21 1400  Weight: 102.1 kg   Examination: Physical Exam:  Constitutional: WN/WD obese Caucasian female currently in no acute distress Eyes: Lids and conjunctivae normal, sclerae anicteric  ENMT: External Ears, Nose appear normal. Grossly normal hearing. Neck: Appears normal, supple, no cervical masses, normal ROM, no appreciable thyromegaly; no JVD Respiratory: Diminished to auscultation bilaterally, no wheezing, rales, rhonchi or crackles. Normal respiratory effort and patient is not tachypenic. No accessory muscle use.  Unlabored breathing Cardiovascular: RRR, no murmurs / rubs / gallops. S1 and S2 auscultated.  Minimal extremity edema Abdomen: Soft, mildly-tender, non-distended. No masses palpated. No appreciable hepatosplenomegaly. Bowel sounds positive.  GU: Deferred. Musculoskeletal: No clubbing / cyanosis of digits/nails. No joint deformity upper and lower extremities.  Skin: No rashes, lesions, ulcers on limited skin evaluation. No induration; Warm and dry.  Neurologic: CN 2-12 grossly intact with no focal deficits. Romberg sign and cerebellar reflexes not assessed.   Psychiatric: Normal judgment and insight. Alert and oriented x 3. Normal mood and appropriate affect.   Data Reviewed: I have personally reviewed following labs and imaging studies  CBC: Recent Labs  Lab 05/15/21 1459 05/16/21 0412 05/17/21 0427  WBC 9.5 7.0 6.5  NEUTROABS 6.4 3.9  --   HGB 13.8 13.1 13.7  HCT 41.2 39.4 41.2  MCV 91.4 92.9 93.4  PLT 242 194 132   Basic Metabolic Panel: Recent Labs  Lab 05/15/21 1459 05/16/21 0412 05/17/21 0427  NA 137 141 140  K 3.5 3.8 4.4  CL 103 107 104  CO2 26 28 31   GLUCOSE 114* 92 123*  BUN 16 14 10   CREATININE 1.07* 0.91 1.02*  CALCIUM 9.2 9.0 8.9  MG  --  2.3  --   PHOS  --  4.4  --    GFR: Estimated Creatinine Clearance: 73.7 mL/min (A) (by C-G formula based on SCr of 1.02 mg/dL (H)). Liver Function Tests: Recent Labs  Lab 05/15/21 1459 05/16/21 0412 05/17/21 0427  AST 69* 48* 44*  ALT 83* 72* 66*  ALKPHOS 163* 148* 140*  BILITOT  0.8 1.6* 1.2  PROT 6.9 6.3* 6.5  ALBUMIN 3.6 3.2* 3.3*   Recent Labs  Lab 05/15/21 1459  LIPASE 35   No results for input(s): AMMONIA in the last 168 hours. Coagulation Profile: No results for input(s): INR, PROTIME in the last 168 hours. Cardiac Enzymes: No results for input(s): CKTOTAL, CKMB, CKMBINDEX, TROPONINI in the last 168 hours. BNP (last 3 results) No results for input(s): PROBNP in the last 8760 hours. HbA1C: No results for input(s): HGBA1C in the last 72 hours. CBG: No results for input(s): GLUCAP in the last 168 hours. Lipid Profile: No results for input(s): CHOL, HDL, LDLCALC, TRIG, CHOLHDL, LDLDIRECT in the last 72 hours. Thyroid Function Tests: Recent Labs    05/16/21 0412  TSH 2.911   Anemia Panel: No results for input(s): VITAMINB12, FOLATE, FERRITIN, TIBC, IRON, RETICCTPCT in the last 72 hours. Sepsis Labs: No results for input(s): PROCALCITON, LATICACIDVEN in the last 168 hours.  Recent Results (from the past 240 hour(s))  Resp Panel by RT-PCR (Flu  A&B, Covid) Nasopharyngeal Swab     Status: None   Collection Time: 05/15/21  7:03 PM   Specimen: Nasopharyngeal Swab; Nasopharyngeal(NP) swabs in vial transport medium  Result Value Ref Range Status   SARS Coronavirus 2 by RT PCR NEGATIVE NEGATIVE Final    Comment: (NOTE) SARS-CoV-2 target nucleic acids are NOT DETECTED.  The SARS-CoV-2 RNA is generally detectable in upper respiratory specimens during the acute phase of infection. The lowest concentration of SARS-CoV-2 viral copies this assay can detect is 138 copies/mL. A negative result does not preclude SARS-Cov-2 infection and should not be used as the sole basis for treatment or other patient management decisions. A negative result may occur with  improper specimen collection/handling, submission of specimen other than nasopharyngeal swab, presence of viral mutation(s) within the areas targeted by this assay, and inadequate number of viral copies(<138 copies/mL). A negative result must be combined with clinical observations, patient history, and epidemiological information. The expected result is Negative.  Fact Sheet for Patients:  EntrepreneurPulse.com.au  Fact Sheet for Healthcare Providers:  IncredibleEmployment.be  This test is no t yet approved or cleared by the [place] FDA and  has been authorized for detection and/or diagnosis of SARS-CoV-2 by FDA under an Emergency Use Authorization (EUA). This EUA will remain  in effect (meaning this test can be used) for the duration of the COVID-19 declaration under Section 564(b)(1) of the Act, 21 U.S.C.section 360bbb-3(b)(1), unless the authorization is terminated  or revoked sooner.       Influenza A by PCR NEGATIVE NEGATIVE Final   Influenza B by PCR NEGATIVE NEGATIVE Final    Comment: (NOTE) The Xpert Xpress SARS-CoV-2/FLU/RSV plus assay is intended as an aid in the diagnosis of influenza from Nasopharyngeal swab specimens  and should not be used as a sole basis for treatment. Nasal washings and aspirates are unacceptable for Xpert Xpress SARS-CoV-2/FLU/RSV testing.  Fact Sheet for Patients: EntrepreneurPulse.com.au  Fact Sheet for Healthcare Providers: IncredibleEmployment.be  This test is not yet approved or cleared by the [place] FDA and has been authorized for detection and/or diagnosis of SARS-CoV-2 by FDA under an Emergency Use Authorization (EUA). This EUA will remain in effect (meaning this test can be used) for the duration of the COVID-19 declaration under Section 564(b)(1) of the Act, 21 U.S.C. section 360bbb-3(b)(1), unless the authorization is terminated or revoked.  Performed at [facility], [address], [place], [place] [address]   Surgical  pcr screen     Status: None   Collection Time: 05/16/21  5:47 PM   Specimen: Nasal Mucosa; Nasal Swab  Result Value Ref Range Status   MRSA, PCR NEGATIVE NEGATIVE Final   Staphylococcus aureus NEGATIVE NEGATIVE Final    Comment: (NOTE) The Xpert SA Assay (FDA approved for NASAL specimens in patients 56 years of age and older), is one component of a comprehensive surveillance program. It is not intended to diagnose infection nor to guide or monitor treatment. Performed at [facility], [place] [address], [place], [place] [address]      RN Pressure Injury Documentation:     Estimated body mass index is 33.23 kg/m as calculated from the following:   Height as of this encounter: 5\' 9"  (1.753 m).   Weight as of this encounter: 102.1 kg.  Malnutrition Type:   Malnutrition Characteristics:   Nutrition Interventions:   Radiology Studies: CT ABDOMEN PELVIS WO CONTRAST  Result Date: 05/15/2021 CLINICAL DATA:  60 year old female with epigastric pain EXAM: CT ABDOMEN AND PELVIS WITHOUT CONTRAST TECHNIQUE: Multidetector CT imaging of the abdomen and pelvis was performed  following the standard protocol without IV contrast. COMPARISON:  Head CT abdomen pelvis dated 10/18/2020. FINDINGS: Evaluation of this exam is limited in the absence of intravenous contrast. Lower chest: The visualized lung bases are clear. No intra-abdominal free air or free fluid. Hepatobiliary: Small subcapsular hypodense lesion along the inferior right lobe of the liver, indeterminate but similar to prior CT. No intrahepatic biliary dilatation. There is a small gallstone. No pericholecystic fluid or evidence of acute cholecystitis by CT. Pancreas: Unremarkable. No pancreatic ductal dilatation or surrounding inflammatory changes. Spleen: Normal in size without focal abnormality. Adrenals/Urinary Tract: The adrenal glands are unremarkable. Status post prior right nephrectomy. No abnormal soft tissue noted in the nephrectomy bed. There is no hydronephrosis or nephrolithiasis on the left. The left ureter and urinary bladder appear unremarkable. Stomach/Bowel: There is sigmoid diverticulosis and scattered colonic diverticula without active inflammatory changes. There is no bowel obstruction or active inflammation. The appendix is normal. Vascular/Lymphatic: The abdominal aorta and IVC are unremarkable. No portal venous gas. There is no adenopathy. Similar appearance of small nodular density along the right psoas muscle as seen on the prior CT. Reproductive: Hysterectomy. No adnexal masses. Other: Postsurgical changes and scarring of the soft tissues the right lower anterior abdominal wall. No fluid collection. Musculoskeletal: Mild degenerative changes. No acute osseous pathology. IMPRESSION: 1. No acute intra-abdominal or pelvic pathology. 2. Colonic diverticulosis. No bowel obstruction. Normal appendix. 3. Cholelithiasis. 4. Status post prior right nephrectomy. No abnormal soft tissue in the nephrectomy bed. 5. Stable small nodular densities along the right psoas muscle as seen on the prior CT. Attention on  follow-up imaging recommended. Electronically Signed   By: Anner Crete M.D.   On: 05/15/2021 15:10   MR 3D Recon At Scanner  Result Date: 05/16/2021 CLINICAL DATA:  Right upper quadrant pain, abnormal ultrasound equivocal for acute cholecystitis, cholelithiasis, possible choledocholithiasis EXAM: MRI ABDOMEN WITHOUT AND WITH CONTRAST (INCLUDING MRCP) TECHNIQUE: Multiplanar multisequence MR imaging of the abdomen was performed both before and after the administration of intravenous contrast. Heavily T2-weighted images of the biliary and pancreatic ducts were obtained, and three-dimensional MRCP images were rendered by post processing. CONTRAST:  27mL GADAVIST GADOBUTROL 1 MMOL/ML IV SOLN COMPARISON:  Right upper quadrant ultrasound, 05/15/2021, CT abdomen pelvis, 05/15/2021 FINDINGS: Lower chest: No acute findings. Hepatobiliary: Simple, nonenhancing cyst of the inferior right lobe of the  liver, hepatic segment VI (series 14, image 20). No mass or other parenchymal abnormality identified. Mildly distended gallbladder. Tiny gallstone near the gallbladder fundus (series 14, image 14). No gallbladder wall thickening. No biliary ductal dilatation or filling defect to the ampulla. Pancreas: No mass, inflammatory changes, or other parenchymal abnormality identified. No pancreatic ductal dilatation. Spleen:  Within normal limits in size and appearance. Adrenals/Urinary Tract: No masses identified. Status post right nephrectomy. No evidence of hydronephrosis. Stomach/Bowel: Visualized portions within the abdomen are unremarkable. Vascular/Lymphatic: No pathologically enlarged lymph nodes identified. No abdominal aortic aneurysm demonstrated. Other:  None. Musculoskeletal: No suspicious bone lesions identified. IMPRESSION: 1. Tiny gallstone near the gallbladder fundus. No gallbladder wall thickening or pericholecystic fluid. 2. No biliary ductal dilatation or filling defect to the ampulla. 3. Status post right  nephrectomy. Electronically Signed   By: Eddie Candle M.D.   On: 05/16/2021 14:44   DG Chest Portable 1 View  Result Date: 05/15/2021 CLINICAL DATA:  Chest pain EXAM: PORTABLE CHEST 1 VIEW COMPARISON:  10/20/2020 FINDINGS: Cardiomegaly. Both lungs are clear. The visualized skeletal structures are unremarkable. IMPRESSION: Cardiomegaly without acute abnormality of the lungs in AP portable projection. Electronically Signed   By: Eddie Candle M.D.   On: 05/15/2021 15:01   MR ABDOMEN MRCP W WO CONTAST  Result Date: 05/16/2021 CLINICAL DATA:  Right upper quadrant pain, abnormal ultrasound equivocal for acute cholecystitis, cholelithiasis, possible choledocholithiasis EXAM: MRI ABDOMEN WITHOUT AND WITH CONTRAST (INCLUDING MRCP) TECHNIQUE: Multiplanar multisequence MR imaging of the abdomen was performed both before and after the administration of intravenous contrast. Heavily T2-weighted images of the biliary and pancreatic ducts were obtained, and three-dimensional MRCP images were rendered by post processing. CONTRAST:  8mL GADAVIST GADOBUTROL 1 MMOL/ML IV SOLN COMPARISON:  Right upper quadrant ultrasound, 05/15/2021, CT abdomen pelvis, 05/15/2021 FINDINGS: Lower chest: No acute findings. Hepatobiliary: Simple, nonenhancing cyst of the inferior right lobe of the liver, hepatic segment VI (series 14, image 20). No mass or other parenchymal abnormality identified. Mildly distended gallbladder. Tiny gallstone near the gallbladder fundus (series 14, image 14). No gallbladder wall thickening. No biliary ductal dilatation or filling defect to the ampulla. Pancreas: No mass, inflammatory changes, or other parenchymal abnormality identified. No pancreatic ductal dilatation. Spleen:  Within normal limits in size and appearance. Adrenals/Urinary Tract: No masses identified. Status post right nephrectomy. No evidence of hydronephrosis. Stomach/Bowel: Visualized portions within the abdomen are unremarkable.  Vascular/Lymphatic: No pathologically enlarged lymph nodes identified. No abdominal aortic aneurysm demonstrated. Other:  None. Musculoskeletal: No suspicious bone lesions identified. IMPRESSION: 1. Tiny gallstone near the gallbladder fundus. No gallbladder wall thickening or pericholecystic fluid. 2. No biliary ductal dilatation or filling defect to the ampulla. 3. Status post right nephrectomy. Electronically Signed   By: Eddie Candle M.D.   On: 05/16/2021 14:44   ECHOCARDIOGRAM COMPLETE  Result Date: 05/16/2021    ECHOCARDIOGRAM REPORT   Patient Name:   SAMORA JERNBERG Date of Exam: 05/16/2021 Medical Rec #:  [id number]          Height:       69.0 in Accession #:    [id number]         Weight:       225.0 lb Date of Birth:  [birth year]         BSA:          2.172 m Patient Age:    13 years           BP:  132/88 mmHg Patient Gender: F                  HR:           61 bpm. Exam Location:  Inpatient Procedure: 2D Echo, Cardiac Doppler and Color Doppler Indications:    Cardiomegaly I51.7  History:        Patient has no prior history of Echocardiogram examinations.                 Risk Factors:Hypertension.  Sonographer:    Bernadene Person RDCS Referring Phys: [place]  1. Left ventricular ejection fraction, by estimation, is 60 to 65%. The left ventricle has normal function. The left ventricle has no regional wall motion abnormalities. There is mild left ventricular hypertrophy. Left ventricular diastolic parameters were normal.  2. Right ventricular systolic function is normal. The right ventricular size is normal. There is normal pulmonary artery systolic pressure. The estimated right ventricular systolic pressure is 09.4 mmHg.  3. The mitral valve is normal in structure. Trivial mitral valve regurgitation.  4. The aortic valve is tricuspid. Aortic valve regurgitation is not visualized. No aortic stenosis is present.  5. The inferior vena cava is normal in size with greater  than 50% respiratory variability, suggesting right atrial pressure of 3 mmHg. FINDINGS  Left Ventricle: Left ventricular ejection fraction, by estimation, is 60 to 65%. The left ventricle has normal function. The left ventricle has no regional wall motion abnormalities. The left ventricular internal cavity size was small. There is mild left ventricular hypertrophy. Left ventricular diastolic parameters were normal. Right Ventricle: The right ventricular size is normal. No increase in right ventricular wall thickness. Right ventricular systolic function is normal. There is normal pulmonary artery systolic pressure. The tricuspid regurgitant velocity is 2.32 m/s, and  with an assumed right atrial pressure of 3 mmHg, the estimated right ventricular systolic pressure is 70.9 mmHg. Left Atrium: Left atrial size was normal in size. Right Atrium: Right atrial size was normal in size. Pericardium: There is no evidence of pericardial effusion. Mitral Valve: The mitral valve is normal in structure. Trivial mitral valve regurgitation. Tricuspid Valve: The tricuspid valve is normal in structure. Tricuspid valve regurgitation is trivial. Aortic Valve: The aortic valve is tricuspid. Aortic valve regurgitation is not visualized. No aortic stenosis is present. Pulmonic Valve: The pulmonic valve was not well visualized. Pulmonic valve regurgitation is trivial. Aorta: The aortic root and ascending aorta are structurally normal, with no evidence of dilitation. Venous: The inferior vena cava is normal in size with greater than 50% respiratory variability, suggesting right atrial pressure of 3 mmHg. IAS/Shunts: The interatrial septum was not well visualized.  LEFT VENTRICLE PLAX 2D LVIDd:         3.60 cm  Diastology LVIDs:         2.30 cm  LV e' medial:    8.03 cm/s LV PW:         2.15 cm  LV E/e' medial:  10.0 LV IVS:        0.90 cm  LV e' lateral:   10.80 cm/s LVOT diam:     2.10 cm  LV E/e' lateral: 7.5 LV SV:         67 LV SV Index:    31 LVOT Area:     3.46 cm  RIGHT VENTRICLE RV S prime:     9.45 cm/s TAPSE (M-mode): 2.4 cm LEFT ATRIUM  Index       RIGHT ATRIUM           Index LA diam:        2.70 cm 1.24 cm/m  RA Area:     16.40 cm LA Vol (A2C):   57.5 ml 26.47 ml/m RA Volume:   46.80 ml  21.55 ml/m LA Vol (A4C):   55.3 ml 25.46 ml/m LA Biplane Vol: 57.9 ml 26.66 ml/m  AORTIC VALVE LVOT Vmax:   87.80 cm/s LVOT Vmean:  58.300 cm/s LVOT VTI:    0.194 m  AORTA Ao Root diam: 3.30 cm MITRAL VALVE               TRICUSPID VALVE MV Area (PHT): 4.06 cm    TR Peak grad:   21.5 mmHg MV Decel Time: 187 msec    TR Vmax:        232.00 cm/s MV E velocity: 80.60 cm/s MV A velocity: 68.60 cm/s  SHUNTS MV E/A ratio:  1.17        Systemic VTI:  0.19 m                            Systemic Diam: 2.10 cm Oswaldo Milian MD Electronically signed by Oswaldo Milian MD Signature Date/Time: 05/16/2021/1:53:51 PM    Final    US Abdomen Limited RUQ (LIVER/GB)  Result Date: 05/15/2021 CLINICAL DATA:  Right upper quadrant pain for 2 weeks. EXAM: ULTRASOUND ABDOMEN LIMITED RIGHT UPPER QUADRANT COMPARISON:  Body CT from the same day. FINDINGS: Gallbladder: Gallbladder calculi the largest measuring 7.5 mm. Gallbladder wall measures 2.6 cm, upper limits of normal. Irregular focal gallbladder wall thickening is noted near the neck of the gallbladder. Positive sonographic Murphy sign noted by sonographer. Common bile duct: Diameter: 7.5 mm. Tiny echogenic focus was seen within the extrahepatic common bile duct, query choledocholithiasis. Liver: No focal lesion identified. Within normal limits in parenchymal echogenicity. Portal vein is patent on color Doppler imaging with normal direction of blood flow towards the liver. Other: None. IMPRESSION: Cholelithiasis.  Equivocal findings for acute cholecystitis. Focal irregular gallbladder wall thickening near the neck of the gallbladder. This could be further evaluated with nonemergent contrast enhanced  MRI of the abdomen to exclude infiltrative lesion. Mildly enlarged common bile duct with questionable choledocholithiasis. Electronically Signed   By: Fidela Salisbury M.D.   On: 05/15/2021 17:19   Scheduled Meds: . metoprolol tartrate  12.5 mg Oral BID  . pantoprazole  40 mg Oral BID  . sodium chloride flush  3 mL Intravenous Q12H   Continuous Infusions: . sodium chloride    . cefTRIAXone (ROCEPHIN)  IV 2 g (05/16/21 2250)  . dextrose 5 % and 0.45 % NaCl with KCl 20 mEq/L 75 mL/hr at 05/16/21 2048  . metronidazole 500 mg (05/17/21 0548)    LOS: 2 days   Kerney Elbe, DO [facility] PAGER is on [place]  If 7PM-7AM, please contact night-coverage [email or web address]

## 2021-05-17 NOTE — Op Note (Signed)
05/15/2021 - 05/17/2021  6:02 PM  PATIENT:  Samantha Roberson  60 y.o. female  Patient Care Team: Glendon Axe, MD as PCP - General (Family Medicine)  PRE-OPERATIVE DIAGNOSIS: Symptomatic cholelithiasis  POST-OPERATIVE DIAGNOSIS: Symptomatic cholelithiasis  PROCEDURE:   LAPAROSCOPIC CHOLECYSTECTOMY  SURGEON:  Surgeon(s): Michael Boston, MD Leighton Ruff, MD  ASSISTANT: Dr Johney Maine   ANESTHESIA:   local and general  EBL: 43ml Total I/O In: 681.7 [I.V.:587.4; IV Piggyback:94.3] Out: 1600 [Urine:1600]  DRAINS: none   SPECIMEN:  Source of Specimen:  Gallbladder  DISPOSITION OF SPECIMEN:  PATHOLOGY  COUNTS:  YES  PLAN OF CARE: Patient already admitted  PATIENT DISPOSITION:  PACU - hemodynamically stable.  INDICATION: 60 year old female who presents to the hospital with right upper quadrant pain after eating.  Ultrasound was equivocal for cholecystitis.  MRCP showed no choledocholithiasis or cholecystitis.  Given her clinical presentation, I recommended cholecystectomy.  We discussed that this has approximately 80% chance of resolving her symptoms with her current work-up findings.  The anatomy & physiology of hepatobiliary & pancreatic function was discussed.  The pathophysiology of gallbladder dysfunction was discussed.  Natural history risks without surgery was discussed.   I feel the risks of no intervention will lead to serious problems that outweigh the operative risks; therefore, I recommended cholecystectomy to remove the pathology.  I explained laparoscopic techniques with possible need for an open approach.  Probable cholangiogram to evaluate the bilary tract was explained as well.    Risks such as bleeding, infection, abscess, leak, injury to other organs, need for further treatment, heart attack, death, and other risks were discussed.  I noted a good likelihood this will help address the problem.  Possibility that this will not correct all abdominal symptoms was  explained.  Goals of post-operative recovery were discussed as well.    OR FINDINGS: Chronically inflamed gallbladder  DESCRIPTION:   The patient was identified & brought into the operating room. The patient was positioned supine with arms tucked. SCDs were active during the entire case. The patient underwent general anesthesia without any difficulty.  The abdomen was prepped and draped in a sterile fashion. A Surgical Timeout was performed and confirmed our plan.  We positioned the patient in reverse [place] & right side up.  I placed a Hassan laparoscopic port through the umbilicus using open entry technique.  Entry was clean. There were no adhesions to the anterior abdominal wall supraumbilically.  We induced carbon dioxide insufflation. Camera inspection revealed no injury.   I proceeded to continue with laparoscopic technique. I placed a 5 mm port in mid subcostal region, another 7mm port in the right flank near the anterior axillary line, and a 54mm port in the left subxiphoid region obliquely within the falciform ligament.  There were omental adhesions to the abdominal wall due to her previous hand-assisted nephrectomy.  These were taken down using blunt dissection and electrocautery.  I turned attention to the right upper quadrant.    The gallbladder fundus was elevated cephalad. I used cautery and blunt dissection to free the peritoneal coverings between the gallbladder and the liver on the posteriolateral and anteriomedial walls.   I used careful blunt and cautery dissection with a maryland dissector to help get a good critical view of the cystic artery and cystic duct. I did further dissection to free a few centimeters of the  gallbladder off the liver bed to get a good critical view of the infundibulum and cystic duct. I mobilized the cystic artery.  I skeletonized the cystic duct.  After getting a good 360 view, I decided not to perform a cholangiogram.  I placed a clip on the  infundibulum.  I placed clips on the cystic duct x3.  I completed cystic duct transection.   I placed clips on the cystic artery x3 with 2 proximally.  I ligated the cystic artery using scissors. I freed the gallbladder from its remaining attachments to the liver. I ensured hemostasis on the gallbladder fossa of the liver and elsewhere. I inspected the rest of the abdomen & detected no injury nor bleeding elsewhere.  I irrigated the RUQ with normal saline.  I removed the gallbladder through the umbilical port site.  I closed the umbilical fascia using 0 Vicryl stitches x2 with a laparoscopic suture passer.   I closed the skin using 4-0 vicryl stitch.  Sterile dressings were applied. The patient was extubated & arrived in the PACU in stable condition.  I had discussed postoperative care with the patient in the holding area.   I will discuss  operative findings and postoperative goals / instructions with the patient's family.  Instructions are written in the chart as well.

## 2021-05-17 NOTE — Transfer of Care (Signed)
Immediate Anesthesia Transfer of Care Note  Patient: Tucker Steedley Handrich  Procedure(s) Performed: LAPAROSCOPIC CHOLECYSTECTOMY (N/A )  Patient Location: PACU  Anesthesia Type:General  Level of Consciousness: awake, alert  and oriented  Airway & Oxygen Therapy: Patient Spontanous Breathing and Patient connected to face mask  Post-op Assessment: Report given to RN and Post -op Vital signs reviewed and stable  Post vital signs: Reviewed and stable  Last Vitals:  Vitals Value Taken Time  BP 165/98 05/17/21 1824  Temp 36.4 C 05/17/21 1815  Pulse 71 05/17/21 1828  Resp 19 05/17/21 1828  SpO2 97 % 05/17/21 1828  Vitals shown include unvalidated device data.  Last Pain:  Vitals:   05/17/21 1815  TempSrc:   PainSc: 10-Worst pain ever      Patients Stated Pain Goal: 1 (16/83/72 9021)  Complications: No complications documented.

## 2021-05-17 NOTE — Anesthesia Postprocedure Evaluation (Signed)
Anesthesia Post Note  Patient: Samantha Roberson  Procedure(s) Performed: LAPAROSCOPIC CHOLECYSTECTOMY (N/A )     Patient location during evaluation: PACU Anesthesia Type: General Level of consciousness: awake and alert Pain management: pain level controlled Vital Signs Assessment: post-procedure vital signs reviewed and stable Respiratory status: spontaneous breathing, nonlabored ventilation, respiratory function stable and patient connected to nasal cannula oxygen Cardiovascular status: blood pressure returned to baseline and stable Postop Assessment: no apparent nausea or vomiting Anesthetic complications: no   No complications documented.  Last Vitals:  Vitals:   05/17/21 1830 05/17/21 1845  BP: (!) 155/92 (!) 144/90  Pulse: 72 74  Resp: 10 (!) 21  Temp:    SpO2: 97% 97%    Last Pain:  Vitals:   05/17/21 1845  TempSrc:   PainSc: 8                  Rafal Archuleta S

## 2021-05-17 NOTE — Anesthesia Procedure Notes (Signed)
Procedure Name: Intubation Performed by: Milford Cage, CRNA Pre-anesthesia Checklist: Patient identified, Emergency Drugs available, Suction available and Patient being monitored Patient Re-evaluated:Patient Re-evaluated prior to induction Oxygen Delivery Method: Circle System Utilized Preoxygenation: Pre-oxygenation with 100% oxygen Induction Type: IV induction Ventilation: Mask ventilation without difficulty Grade View: Grade I Tube type: Oral Tube size: 7.0 mm Number of attempts: 1 Airway Equipment and Method: Stylet Placement Confirmation: ETT inserted through vocal cords under direct vision,  positive ETCO2 and breath sounds checked- equal and bilateral Secured at: 21 cm Tube secured with: Tape Dental Injury: Teeth and Oropharynx as per pre-operative assessment

## 2021-05-17 NOTE — Progress Notes (Signed)
MRI/MRCP revealed tiny gallstone near the gallbladder fundus. No biliary ductal dilatation or filling defect to the ampulla.  Surgical note reviewed; plans for laparoscopic cholecystectomy today.  Further management as per CCS.  Follow up with primary GI (Dr. Collene Mares) as needed.  [facility] will sign off.  Please contact us if we can be of any further assistance during this hospital stay.

## 2021-05-17 NOTE — Anesthesia Preprocedure Evaluation (Signed)
Anesthesia Evaluation  Patient identified by MRN, date of birth, ID band Patient awake    Reviewed: Allergy & Precautions, NPO status , Patient's Chart, lab work & pertinent test results  Airway Mallampati: II  TM Distance: >3 FB Neck ROM: Full    Dental no notable dental hx.    Pulmonary neg pulmonary ROS,    Pulmonary exam normal breath sounds clear to auscultation       Cardiovascular hypertension, Normal cardiovascular exam Rhythm:Regular Rate:Normal  Untreated HTN   Neuro/Psych negative neurological ROS  negative psych ROS   GI/Hepatic negative GI ROS, Neg liver ROS,   Endo/Other  negative endocrine ROS  Renal/GU negative Renal ROS  negative genitourinary   Musculoskeletal negative musculoskeletal ROS (+)   Abdominal   Peds negative pediatric ROS (+)  Hematology negative hematology ROS (+)   Anesthesia Other Findings   Reproductive/Obstetrics negative OB ROS                             Anesthesia Physical Anesthesia Plan  ASA: III  Anesthesia Plan: General   Post-op Pain Management:    Induction: Intravenous  PONV Risk Score and Plan:   Airway Management Planned:   Additional Equipment:   Intra-op Plan:   Post-operative Plan: Extubation in OR  Informed Consent: I have reviewed the patients History and Physical, chart, labs and discussed the procedure including the risks, benefits and alternatives for the proposed anesthesia with the patient or authorized representative who has indicated his/her understanding and acceptance.     Dental advisory given  Plan Discussed with: CRNA  Anesthesia Plan Comments:         Anesthesia Quick Evaluation

## 2021-05-17 NOTE — Progress Notes (Signed)
Acute calculous cholecystitis  Subjective: Feels about the same  Objective: Vital signs in last 24 hours: Temp:  [97.5 F (36.4 C)-97.9 F (36.6 C)] 97.6 F (36.4 C) (06/01 0418) Pulse Rate:  [57-90] 57 (06/01 0418) Resp:  [16-17] 16 (06/01 0418) BP: (121-176)/(63-77) 121/63 (06/01 0418) SpO2:  [87 %-100 %] 98 % (06/01 0418) Last BM Date: 05/15/21  Intake/Output from previous day: 05/31 0701 - 06/01 0700 In: 1298.8 [P.O.:360; I.V.:688.4; IV Piggyback:250.4] Out: 3100 [Urine:3100] Intake/Output this shift: Total I/O In: -  Out: 1600 [Urine:1600]  General appearance: alert and cooperative GI: normal findings: soft, non-tender  Lab Results:  Results for orders placed or performed during the hospital encounter of 05/15/21 (from the past 24 hour(s))  Surgical pcr screen     Status: None   Collection Time: 05/16/21  5:47 PM   Specimen: Nasal Mucosa; Nasal Swab  Result Value Ref Range   MRSA, PCR NEGATIVE NEGATIVE   Staphylococcus aureus NEGATIVE NEGATIVE  Comprehensive metabolic panel     Status: Abnormal   Collection Time: 05/17/21  4:27 AM  Result Value Ref Range   Sodium 140 135 - 145 mmol/L   Potassium 4.4 3.5 - 5.1 mmol/L   Chloride 104 98 - 111 mmol/L   CO2 31 22 - 32 mmol/L   Glucose, Bld 123 (H) 70 - 99 mg/dL   BUN 10 8 - 23 mg/dL   Creatinine, Ser 1.02 (H) 0.44 - 1.00 mg/dL   Calcium 8.9 8.9 - 10.3 mg/dL   Total Protein 6.5 6.5 - 8.1 g/dL   Albumin 3.3 (L) 3.5 - 5.0 g/dL   AST 44 (H) 15 - 41 U/L   ALT 66 (H) 0 - 44 U/L   Alkaline Phosphatase 140 (H) 38 - 126 U/L   Total Bilirubin 1.2 0.3 - 1.2 mg/dL   GFR, Estimated >60 >60 mL/min   Anion gap 5 5 - 15  CBC     Status: None   Collection Time: 05/17/21  4:27 AM  Result Value Ref Range   WBC 6.5 4.0 - 10.5 K/uL   RBC 4.41 3.87 - 5.11 MIL/uL   Hemoglobin 13.7 12.0 - 15.0 g/dL   HCT 41.2 36.0 - 46.0 %   MCV 93.4 80.0 - 100.0 fL   MCH 31.1 26.0 - 34.0 pg   MCHC 33.3 30.0 - 36.0 g/dL   RDW 12.5 11.5 -  15.5 %   Platelets 216 150 - 400 K/uL   nRBC 0.0 0.0 - 0.2 %     Studies/Results Radiology     MEDS, Scheduled . metoprolol tartrate  12.5 mg Oral BID  . pantoprazole  40 mg Oral BID  . sodium chloride flush  3 mL Intravenous Q12H     Assessment: Acute calculous cholecystitis Plan for lap chole and IOC later today  Plan: NPO OR  LOS: 2 days    Rosario Adie, MD [facility]    05/17/2021 11:05 AM

## 2021-05-18 ENCOUNTER — Encounter (HOSPITAL_COMMUNITY): Payer: Self-pay | Admitting: General Surgery

## 2021-05-18 DIAGNOSIS — D72829 Elevated white blood cell count, unspecified: Secondary | ICD-10-CM

## 2021-05-18 DIAGNOSIS — K8020 Calculus of gallbladder without cholecystitis without obstruction: Secondary | ICD-10-CM

## 2021-05-18 LAB — COMPREHENSIVE METABOLIC PANEL
ALT: 254 U/L — ABNORMAL HIGH (ref 0–44)
AST: 315 U/L — ABNORMAL HIGH (ref 15–41)
Albumin: 3.7 g/dL (ref 3.5–5.0)
Alkaline Phosphatase: 232 U/L — ABNORMAL HIGH (ref 38–126)
Anion gap: 9 (ref 5–15)
BUN: 8 mg/dL (ref 8–23)
CO2: 29 mmol/L (ref 22–32)
Calcium: 9.4 mg/dL (ref 8.9–10.3)
Chloride: 100 mmol/L (ref 98–111)
Creatinine, Ser: 0.86 mg/dL (ref 0.44–1.00)
GFR, Estimated: >60 mL/min (ref >60)
Glucose, Bld: 133 mg/dL — ABNORMAL HIGH (ref 70–99)
Potassium: 4.2 mmol/L (ref 3.5–5.1)
Sodium: 138 mmol/L (ref 135–145)
Total Bilirubin: 3.7 mg/dL — ABNORMAL HIGH (ref 0.3–1.2)
Total Protein: 7.2 g/dL (ref 6.5–8.1)

## 2021-05-18 LAB — PHOSPHORUS: Phosphorus: 2.9 mg/dL (ref 2.5–4.6)

## 2021-05-18 LAB — CBC WITH DIFFERENTIAL/PLATELET
Abs Immature Granulocytes: 0.04 10*3/uL (ref 0.00–0.07)
Basophils Absolute: 0.1 10*3/uL (ref 0.0–0.1)
Basophils Relative: 0 %
Eosinophils Absolute: 0.1 10*3/uL (ref 0.0–0.5)
Eosinophils Relative: 0 %
HCT: 44.9 % (ref 36.0–46.0)
Hemoglobin: 14.9 g/dL (ref 12.0–15.0)
Immature Granulocytes: 0 %
Lymphocytes Relative: 4 %
Lymphs Abs: 0.6 10*3/uL — ABNORMAL LOW (ref 0.7–4.0)
MCH: 30.8 pg (ref 26.0–34.0)
MCHC: 33.2 g/dL (ref 30.0–36.0)
MCV: 92.8 fL (ref 80.0–100.0)
Monocytes Absolute: 0.6 10*3/uL (ref 0.1–1.0)
Monocytes Relative: 5 %
Neutro Abs: 12.4 10*3/uL — ABNORMAL HIGH (ref 1.7–7.7)
Neutrophils Relative %: 91 %
Platelets: 236 10*3/uL (ref 150–400)
RBC: 4.84 MIL/uL (ref 3.87–5.11)
RDW: 12.5 % (ref 11.5–15.5)
WBC: 13.7 10*3/uL — ABNORMAL HIGH (ref 4.0–10.5)
nRBC: 0 % (ref 0.0–0.2)

## 2021-05-18 LAB — MAGNESIUM: Magnesium: 2 mg/dL (ref 1.7–2.4)

## 2021-05-18 NOTE — Progress Notes (Signed)
Progress Note  1 Day Post-Op  Subjective: CC: some nausea post op overnight but none this am. No emesis. Has not had breakfast yet. Passing flatus. Post op abdominal pain well controlled. Has not ambulated yet.  Objective: Vital signs in last 24 hours: Temp:  [97.3 F (36.3 C)-98.5 F (36.9 C)] 98.5 F (36.9 C) (06/02 0235) Pulse Rate:  [70-88] 88 (06/02 0235) Resp:  [10-21] 18 (06/02 0235) BP: (133-165)/(79-107) 133/82 (06/02 0235) SpO2:  [94 %-100 %] 94 % (06/02 0235) Last BM Date: 05/15/21  Intake/Output from previous day: 06/01 0701 - 06/02 0700 In: 2376.3 [P.O.:960; I.V.:1023; IV Piggyback:393.3] Out: 3415 [Urine:3400; Blood:15] Intake/Output this shift: Total I/O In: 360 [P.O.:360] Out: 500 [Urine:500]  PE: General: pleasant, WD, female who is laying in bed in NAD HEENT: head is normocephalic, atraumatic.  Mouth is pink and moist Heart: regular, rate, and rhythm. Palpable radial and pedal pulses bilaterally Lungs: no cyanosis. Respiratory effort nonlabored on room air Abd: soft, ND, +BS, mild postoperative tenderness, Incisions with surgical glue intact - no erythema or discharge MS: no calf TTP Skin: warm and dry with no masses, lesions, or rashes Psych: A&Ox3 with an appropriate affect.    Lab Results:  Recent Labs    05/17/21 0427 05/18/21 0414  WBC 6.5 13.7*  HGB 13.7 14.9  HCT 41.2 44.9  PLT 216 236   BMET Recent Labs    05/17/21 0427 05/18/21 0414  NA 140 138  K 4.4 4.2  CL 104 100  CO2 31 29  GLUCOSE 123* 133*  BUN 10 8  CREATININE 1.02* 0.86  CALCIUM 8.9 9.4   PT/INR No results for input(s): LABPROT, INR in the last 72 hours. CMP     Component Value Date/Time   NA 138 05/18/2021 0414   K 4.2 05/18/2021 0414   CL 100 05/18/2021 0414   CO2 29 05/18/2021 0414   GLUCOSE 133 (H) 05/18/2021 0414   BUN 8 05/18/2021 0414   CREATININE 0.86 05/18/2021 0414   CALCIUM 9.4 05/18/2021 0414   PROT 7.2 05/18/2021 0414   ALBUMIN 3.7  05/18/2021 0414   AST 315 (H) 05/18/2021 0414   ALT 254 (H) 05/18/2021 0414   ALKPHOS 232 (H) 05/18/2021 0414   BILITOT 3.7 (H) 05/18/2021 0414   GFRNONAA >60 05/18/2021 0414   GFRAA >60 12/14/2018 0950   Lipase     Component Value Date/Time   LIPASE 35 05/15/2021 1459       Studies/Results: MR 3D Recon At Scanner  Result Date: 05/16/2021 CLINICAL DATA:  Right upper quadrant pain, abnormal ultrasound equivocal for acute cholecystitis, cholelithiasis, possible choledocholithiasis EXAM: MRI ABDOMEN WITHOUT AND WITH CONTRAST (INCLUDING MRCP) TECHNIQUE: Multiplanar multisequence MR imaging of the abdomen was performed both before and after the administration of intravenous contrast. Heavily T2-weighted images of the biliary and pancreatic ducts were obtained, and three-dimensional MRCP images were rendered by post processing. CONTRAST:  52mL GADAVIST GADOBUTROL 1 MMOL/ML IV SOLN COMPARISON:  Right upper quadrant ultrasound, 05/15/2021, CT abdomen pelvis, 05/15/2021 FINDINGS: Lower chest: No acute findings. Hepatobiliary: Simple, nonenhancing cyst of the inferior right lobe of the liver, hepatic segment VI (series 14, image 20). No mass or other parenchymal abnormality identified. Mildly distended gallbladder. Tiny gallstone near the gallbladder fundus (series 14, image 14). No gallbladder wall thickening. No biliary ductal dilatation or filling defect to the ampulla. Pancreas: No mass, inflammatory changes, or other parenchymal abnormality identified. No pancreatic ductal dilatation. Spleen:  Within normal limits in size and  appearance. Adrenals/Urinary Tract: No masses identified. Status post right nephrectomy. No evidence of hydronephrosis. Stomach/Bowel: Visualized portions within the abdomen are unremarkable. Vascular/Lymphatic: No pathologically enlarged lymph nodes identified. No abdominal aortic aneurysm demonstrated. Other:  None. Musculoskeletal: No suspicious bone lesions identified.  IMPRESSION: 1. Tiny gallstone near the gallbladder fundus. No gallbladder wall thickening or pericholecystic fluid. 2. No biliary ductal dilatation or filling defect to the ampulla. 3. Status post right nephrectomy. Electronically Signed   By: Eddie Candle M.D.   On: 05/16/2021 14:44   MR ABDOMEN MRCP W WO CONTAST  Result Date: 05/16/2021 CLINICAL DATA:  Right upper quadrant pain, abnormal ultrasound equivocal for acute cholecystitis, cholelithiasis, possible choledocholithiasis EXAM: MRI ABDOMEN WITHOUT AND WITH CONTRAST (INCLUDING MRCP) TECHNIQUE: Multiplanar multisequence MR imaging of the abdomen was performed both before and after the administration of intravenous contrast. Heavily T2-weighted images of the biliary and pancreatic ducts were obtained, and three-dimensional MRCP images were rendered by post processing. CONTRAST:  86mL GADAVIST GADOBUTROL 1 MMOL/ML IV SOLN COMPARISON:  Right upper quadrant ultrasound, 05/15/2021, CT abdomen pelvis, 05/15/2021 FINDINGS: Lower chest: No acute findings. Hepatobiliary: Simple, nonenhancing cyst of the inferior right lobe of the liver, hepatic segment VI (series 14, image 20). No mass or other parenchymal abnormality identified. Mildly distended gallbladder. Tiny gallstone near the gallbladder fundus (series 14, image 14). No gallbladder wall thickening. No biliary ductal dilatation or filling defect to the ampulla. Pancreas: No mass, inflammatory changes, or other parenchymal abnormality identified. No pancreatic ductal dilatation. Spleen:  Within normal limits in size and appearance. Adrenals/Urinary Tract: No masses identified. Status post right nephrectomy. No evidence of hydronephrosis. Stomach/Bowel: Visualized portions within the abdomen are unremarkable. Vascular/Lymphatic: No pathologically enlarged lymph nodes identified. No abdominal aortic aneurysm demonstrated. Other:  None. Musculoskeletal: No suspicious bone lesions identified. IMPRESSION: 1. Tiny  gallstone near the gallbladder fundus. No gallbladder wall thickening or pericholecystic fluid. 2. No biliary ductal dilatation or filling defect to the ampulla. 3. Status post right nephrectomy. Electronically Signed   By: Eddie Candle M.D.   On: 05/16/2021 14:44   ECHOCARDIOGRAM COMPLETE  Result Date: 05/16/2021    ECHOCARDIOGRAM REPORT   Patient Name:   Samantha Roberson Date of Exam: 05/16/2021 Medical Rec #:  [id number]          Height:       69.0 in Accession #:    [id number]         Weight:       225.0 lb Date of Birth:  21-Jul-1961         BSA:          2.172 m Patient Age:    84 years           BP:           132/88 mmHg Patient Gender: F                  HR:           61 bpm. Exam Location:  Inpatient Procedure: 2D Echo, Cardiac Doppler and Color Doppler Indications:    Cardiomegaly I51.7  History:        Patient has no prior history of Echocardiogram examinations.                 Risk Factors:Hypertension.  Sonographer:    Bernadene Person RDCS Referring Phys: [place]  1. Left ventricular ejection fraction, by estimation, is 60 to 65%. The left ventricle has normal function.  The left ventricle has no regional wall motion abnormalities. There is mild left ventricular hypertrophy. Left ventricular diastolic parameters were normal.  2. Right ventricular systolic function is normal. The right ventricular size is normal. There is normal pulmonary artery systolic pressure. The estimated right ventricular systolic pressure is 29.9 mmHg.  3. The mitral valve is normal in structure. Trivial mitral valve regurgitation.  4. The aortic valve is tricuspid. Aortic valve regurgitation is not visualized. No aortic stenosis is present.  5. The inferior vena cava is normal in size with greater than 50% respiratory variability, suggesting right atrial pressure of 3 mmHg. FINDINGS  Left Ventricle: Left ventricular ejection fraction, by estimation, is 60 to 65%. The left ventricle has normal  function. The left ventricle has no regional wall motion abnormalities. The left ventricular internal cavity size was small. There is mild left ventricular hypertrophy. Left ventricular diastolic parameters were normal. Right Ventricle: The right ventricular size is normal. No increase in right ventricular wall thickness. Right ventricular systolic function is normal. There is normal pulmonary artery systolic pressure. The tricuspid regurgitant velocity is 2.32 m/s, and  with an assumed right atrial pressure of 3 mmHg, the estimated right ventricular systolic pressure is 24.2 mmHg. Left Atrium: Left atrial size was normal in size. Right Atrium: Right atrial size was normal in size. Pericardium: There is no evidence of pericardial effusion. Mitral Valve: The mitral valve is normal in structure. Trivial mitral valve regurgitation. Tricuspid Valve: The tricuspid valve is normal in structure. Tricuspid valve regurgitation is trivial. Aortic Valve: The aortic valve is tricuspid. Aortic valve regurgitation is not visualized. No aortic stenosis is present. Pulmonic Valve: The pulmonic valve was not well visualized. Pulmonic valve regurgitation is trivial. Aorta: The aortic root and ascending aorta are structurally normal, with no evidence of dilitation. Venous: The inferior vena cava is normal in size with greater than 50% respiratory variability, suggesting right atrial pressure of 3 mmHg. IAS/Shunts: The interatrial septum was not well visualized.  LEFT VENTRICLE PLAX 2D LVIDd:         3.60 cm  Diastology LVIDs:         2.30 cm  LV e' medial:    8.03 cm/s LV PW:         2.15 cm  LV E/e' medial:  10.0 LV IVS:        0.90 cm  LV e' lateral:   10.80 cm/s LVOT diam:     2.10 cm  LV E/e' lateral: 7.5 LV SV:         67 LV SV Index:   31 LVOT Area:     3.46 cm  RIGHT VENTRICLE RV S prime:     9.45 cm/s TAPSE (M-mode): 2.4 cm LEFT ATRIUM             Index       RIGHT ATRIUM           Index LA diam:        2.70 cm 1.24 cm/m  RA  Area:     16.40 cm LA Vol (A2C):   57.5 ml 26.47 ml/m RA Volume:   46.80 ml  21.55 ml/m LA Vol (A4C):   55.3 ml 25.46 ml/m LA Biplane Vol: 57.9 ml 26.66 ml/m  AORTIC VALVE LVOT Vmax:   87.80 cm/s LVOT Vmean:  58.300 cm/s LVOT VTI:    0.194 m  AORTA Ao Root diam: 3.30 cm MITRAL VALVE  TRICUSPID VALVE MV Area (PHT): 4.06 cm    TR Peak grad:   21.5 mmHg MV Decel Time: 187 msec    TR Vmax:        232.00 cm/s MV E velocity: 80.60 cm/s MV A velocity: 68.60 cm/s  SHUNTS MV E/A ratio:  1.17        Systemic VTI:  0.19 m                            Systemic Diam: 2.10 cm Oswaldo Milian MD Electronically signed by Oswaldo Milian MD Signature Date/Time: 05/16/2021/1:53:51 PM    Final     Anti-infectives: Anti-infectives (From admission, onward)   Start     Dose/Rate Route Frequency Ordered Stop   05/16/21 2200  cefTRIAXone (ROCEPHIN) 2 g in sodium chloride 0.9 % 100 mL IVPB        2 g 200 mL/hr over 30 Minutes Intravenous Every 24 hours 05/16/21 1527     05/16/21 2200  metroNIDAZOLE (FLAGYL) IVPB 500 mg        500 mg 100 mL/hr over 60 Minutes Intravenous Every 8 hours 05/16/21 1527     05/15/21 2300  piperacillin-tazobactam (ZOSYN) IVPB 3.375 g  Status:  Discontinued        3.375 g 12.5 mL/hr over 240 Minutes Intravenous Every 8 hours 05/15/21 2205 05/16/21 1527       Assessment/Plan Symptomatic cholelithiasis - POD 1 laparoscopic cholecystomy - AT - pre op MRCP was negative. Pre op bilirubin normal and no IOC performed - Bilirubin 3.7 (1.2) - have contacted Dr. Benson Norway - tentatively planning for ERCP tomorrow - ambulate  FEN: clear liquids, ADAT, NPO MN ID: rocephin, flagyl - no further abx needed from our perspective VTE: SCDs   HTN H/o R nephrectomy   LOS: 3 days    Winferd Humphrey, [facility] 05/18/2021, 10:33 AM Please see Amion for pager number during day hours 7:00am-4:30pm

## 2021-05-18 NOTE — H&P (View-Only) (Signed)
Subjective: No complaints.  Feeling well.  Objective: Vital signs in last 24 hours: Temp:  [97.3 F (36.3 C)-98.5 F (36.9 C)] 97.3 F (36.3 C) (06/02 1338) Pulse Rate:  [72-88] 79 (06/02 1338) Resp:  [10-21] 18 (06/02 1338) BP: (130-165)/(79-107) 130/82 (06/02 1338) SpO2:  [94 %-100 %] 96 % (06/02 1338) Last BM Date: 05/15/21  Intake/Output from previous day: 06/01 0701 - 06/02 0700 In: 2376.3 [P.O.:960; I.V.:1023; IV Piggyback:393.3] Out: 3415 [Urine:3400; Blood:15] Intake/Output this shift: Total I/O In: 600 [P.O.:600] Out: 900 [Urine:900]  General appearance: alert and no distress GI: tender at the incision sites  Lab Results: Recent Labs    05/16/21 0412 05/17/21 0427 05/18/21 0414  WBC 7.0 6.5 13.7*  HGB 13.1 13.7 14.9  HCT 39.4 41.2 44.9  PLT 194 216 236   BMET Recent Labs    05/16/21 0412 05/17/21 0427 05/18/21 0414  NA 141 140 138  K 3.8 4.4 4.2  CL 107 104 100  CO2 28 31 29   GLUCOSE 92 123* 133*  BUN 14 10 8   CREATININE 0.91 1.02* 0.86  CALCIUM 9.0 8.9 9.4   LFT Recent Labs    05/18/21 0414  PROT 7.2  ALBUMIN 3.7  AST 315*  ALT 254*  ALKPHOS 232*  BILITOT 3.7*   PT/INR No results for input(s): LABPROT, INR in the last 72 hours. Hepatitis Panel No results for input(s): HEPBSAG, HCVAB, HEPAIGM, HEPBIGM in the last 72 hours. C-Diff No results for input(s): CDIFFTOX in the last 72 hours. Fecal Lactopherrin No results for input(s): FECLLACTOFRN in the last 72 hours.  Studies/Results: MR 3D Recon At Scanner  Result Date: 05/16/2021 CLINICAL DATA:  Right upper quadrant pain, abnormal ultrasound equivocal for acute cholecystitis, cholelithiasis, possible choledocholithiasis EXAM: MRI ABDOMEN WITHOUT AND WITH CONTRAST (INCLUDING MRCP) TECHNIQUE: Multiplanar multisequence MR imaging of the abdomen was performed both before and after the administration of intravenous contrast. Heavily T2-weighted images of the biliary and pancreatic ducts  were obtained, and three-dimensional MRCP images were rendered by post processing. CONTRAST:  50mL GADAVIST GADOBUTROL 1 MMOL/ML IV SOLN COMPARISON:  Right upper quadrant ultrasound, 05/15/2021, CT abdomen pelvis, 05/15/2021 FINDINGS: Lower chest: No acute findings. Hepatobiliary: Simple, nonenhancing cyst of the inferior right lobe of the liver, hepatic segment VI (series 14, image 20). No mass or other parenchymal abnormality identified. Mildly distended gallbladder. Tiny gallstone near the gallbladder fundus (series 14, image 14). No gallbladder wall thickening. No biliary ductal dilatation or filling defect to the ampulla. Pancreas: No mass, inflammatory changes, or other parenchymal abnormality identified. No pancreatic ductal dilatation. Spleen:  Within normal limits in size and appearance. Adrenals/Urinary Tract: No masses identified. Status post right nephrectomy. No evidence of hydronephrosis. Stomach/Bowel: Visualized portions within the abdomen are unremarkable. Vascular/Lymphatic: No pathologically enlarged lymph nodes identified. No abdominal aortic aneurysm demonstrated. Other:  None. Musculoskeletal: No suspicious bone lesions identified. IMPRESSION: 1. Tiny gallstone near the gallbladder fundus. No gallbladder wall thickening or pericholecystic fluid. 2. No biliary ductal dilatation or filling defect to the ampulla. 3. Status post right nephrectomy. Electronically Signed   By: Eddie Candle M.D.   On: 05/16/2021 14:44   MR ABDOMEN MRCP W WO CONTAST  Result Date: 05/16/2021 CLINICAL DATA:  Right upper quadrant pain, abnormal ultrasound equivocal for acute cholecystitis, cholelithiasis, possible choledocholithiasis EXAM: MRI ABDOMEN WITHOUT AND WITH CONTRAST (INCLUDING MRCP) TECHNIQUE: Multiplanar multisequence MR imaging of the abdomen was performed both before and after the administration of intravenous contrast. Heavily T2-weighted images of the biliary and  pancreatic ducts were obtained, and  three-dimensional MRCP images were rendered by post processing. CONTRAST:  71mL GADAVIST GADOBUTROL 1 MMOL/ML IV SOLN COMPARISON:  Right upper quadrant ultrasound, 05/15/2021, CT abdomen pelvis, 05/15/2021 FINDINGS: Lower chest: No acute findings. Hepatobiliary: Simple, nonenhancing cyst of the inferior right lobe of the liver, hepatic segment VI (series 14, image 20). No mass or other parenchymal abnormality identified. Mildly distended gallbladder. Tiny gallstone near the gallbladder fundus (series 14, image 14). No gallbladder wall thickening. No biliary ductal dilatation or filling defect to the ampulla. Pancreas: No mass, inflammatory changes, or other parenchymal abnormality identified. No pancreatic ductal dilatation. Spleen:  Within normal limits in size and appearance. Adrenals/Urinary Tract: No masses identified. Status post right nephrectomy. No evidence of hydronephrosis. Stomach/Bowel: Visualized portions within the abdomen are unremarkable. Vascular/Lymphatic: No pathologically enlarged lymph nodes identified. No abdominal aortic aneurysm demonstrated. Other:  None. Musculoskeletal: No suspicious bone lesions identified. IMPRESSION: 1. Tiny gallstone near the gallbladder fundus. No gallbladder wall thickening or pericholecystic fluid. 2. No biliary ductal dilatation or filling defect to the ampulla. 3. Status post right nephrectomy. Electronically Signed   By: Eddie Candle M.D.   On: 05/16/2021 14:44    Medications:  Scheduled: . metoprolol tartrate  12.5 mg Oral BID  . pantoprazole  40 mg Oral BID  . sodium chloride flush  3 mL Intravenous Q12H   Continuous: . sodium chloride    . cefTRIAXone (ROCEPHIN)  IV 2 g (05/17/21 2248)  . dextrose 5 % and 0.45 % NaCl with KCl 20 mEq/L 75 mL/hr at 05/17/21 1027  . metronidazole 500 mg (05/18/21 0558)    Assessment/Plan: 1) Probable choledocholithiasis. 2) Elevated liver enzymes.   The patient's liver enzymes and TB markedly increased after  the lap chole and an IOC was not performed.  The MRCP the day prior was negative for any filling defects in the CBD.  There is a suspicion that she has a retained CBD stone(s).  The plan is to perform an ERCP.  The risks of bleeding, infection, perforation, and pancreatitis were discussed with the patient and she consents to the procedure.  Plan: 1) ERCP tomorrow.  LOS: 3 days   Annelisa Ryback D 05/18/2021, 1:53 PM

## 2021-05-18 NOTE — Discharge Instructions (Addendum)
[facility]  Please arrive at least 30 min before your appointment to complete your check in paperwork.  If you are unable to arrive 30 min prior to your appointment time we may have to cancel or reschedule you. LAPAROSCOPIC SURGERY: POST OP INSTRUCTIONS Always review your discharge instruction sheet given to you by the facility where your surgery was performed. IF YOU HAVE DISABILITY OR FAMILY LEAVE FORMS, YOU MUST BRING THEM TO THE OFFICE FOR PROCESSING.   DO NOT GIVE THEM TO YOUR DOCTOR.  PAIN CONTROL  1. First take acetaminophen (Tylenol) AND/or ibuprofen (Advil) to control your pain after surgery.  Follow directions on package.  Taking acetaminophen (Tylenol) and/or ibuprofen (Advil) regularly after surgery will help to control your pain and lower the amount of prescription pain medication you may need.  You should not take more than 4,000 mg (4 grams) of acetaminophen (Tylenol) in 24 hours.  You should not take ibuprofen (Advil), aleve, motrin, naprosyn or other NSAIDS if you have a history of stomach ulcers or chronic kidney disease.  2. A prescription for pain medication may be given to you upon discharge.  Take your pain medication as prescribed, if you still have uncontrolled pain after taking acetaminophen (Tylenol) or ibuprofen (Advil). 3. Use ice packs to help control pain. 4. If you need a refill on your pain medication, please contact your pharmacy.  They will contact our office to request authorization. Prescriptions will not be filled after 5pm or on week-ends.  HOME MEDICATIONS 5. Take your usually prescribed medications unless otherwise directed.  DIET 6. You should follow a light diet the first few days after arrival home.  Be sure to include lots of fluids daily. Avoid fatty, fried foods.   CONSTIPATION 7. It is common to experience some constipation after surgery and if you are taking pain medication.  Increasing fluid intake and taking a stool  softener (such as Colace) will usually help or prevent this problem from occurring.  A mild laxative (Milk of Magnesia or Miralax) should be taken according to package instructions if there are no bowel movements after 48 hours.  WOUND/INCISION CARE 8. Most patients will experience some swelling and bruising in the area of the incisions.  Ice packs will help.  Swelling and bruising can take several days to resolve.  9. Unless discharge instructions indicate otherwise, follow guidelines below  a. STERI-STRIPS - you may remove your outer bandages 48 hours after surgery, and you may shower at that time.  You have steri-strips (small skin tapes) in place directly over the incision.  These strips should be left on the skin for 7-10 days.   b. DERMABOND/SKIN GLUE - you may shower in 24 hours.  The glue will flake off over the next 2-3 weeks. 10. Any sutures or staples will be removed at the office during your follow-up visit.  ACTIVITIES 11. You may resume regular (light) daily activities beginning the next day--such as daily self-care, walking, climbing stairs--gradually increasing activities as tolerated.  You may have sexual intercourse when it is comfortable.  Refrain from any heavy lifting or straining until approved by your doctor. 12. No bathing/swimming until after follow up - you may shower a. You may drive when you are no longer taking prescription pain medication, you can comfortably wear a seatbelt, and you can safely maneuver your car and apply brakes.  FOLLOW-UP 13. You should see your doctor in the office for a follow-up appointment approximately 2-3 weeks after your  surgery.  You should have been given your post-op/follow-up appointment when your surgery was scheduled.  If you did not receive a post-op/follow-up appointment, make sure that you call for this appointment within a day or two after you arrive home to insure a convenient appointment time.  OTHER INSTRUCTIONS  WHEN TO CALL  YOUR DOCTOR: 1. Fever over 101.0 2. Inability to urinate 3. Continued bleeding from incision. 4. Increased pain, redness, or drainage from the incision. 5. Increasing abdominal pain  The clinic staff is available to answer your questions during regular business hours.  Please don't hesitate to call and ask to speak to one of the nurses for clinical concerns.  If you have a medical emergency, go to the nearest emergency room or call 911.  A surgeon from [facility] is always on call at the hospital. [address], [place], [place], [place]  [address] ? [address], [place], [place]   [address] [phone] ? [phone] ? FAX [phone]

## 2021-05-18 NOTE — Progress Notes (Signed)
Subjective: No complaints.  Feeling well.  Objective: Vital signs in last 24 hours: Temp:  [97.3 F (36.3 C)-98.5 F (36.9 C)] 97.3 F (36.3 C) (06/02 1338) Pulse Rate:  [72-88] 79 (06/02 1338) Resp:  [10-21] 18 (06/02 1338) BP: (130-165)/(79-107) 130/82 (06/02 1338) SpO2:  [94 %-100 %] 96 % (06/02 1338) Last BM Date: 05/15/21  Intake/Output from previous day: 06/01 0701 - 06/02 0700 In: 2376.3 [P.O.:960; I.V.:1023; IV Piggyback:393.3] Out: 3415 [Urine:3400; Blood:15] Intake/Output this shift: Total I/O In: 600 [P.O.:600] Out: 900 [Urine:900]  General appearance: alert and no distress GI: tender at the incision sites  Lab Results: Recent Labs    05/16/21 0412 05/17/21 0427 05/18/21 0414  WBC 7.0 6.5 13.7*  HGB 13.1 13.7 14.9  HCT 39.4 41.2 44.9  PLT 194 216 236   BMET Recent Labs    05/16/21 0412 05/17/21 0427 05/18/21 0414  NA 141 140 138  K 3.8 4.4 4.2  CL 107 104 100  CO2 28 31 29   GLUCOSE 92 123* 133*  BUN 14 10 8   CREATININE 0.91 1.02* 0.86  CALCIUM 9.0 8.9 9.4   LFT Recent Labs    05/18/21 0414  PROT 7.2  ALBUMIN 3.7  AST 315*  ALT 254*  ALKPHOS 232*  BILITOT 3.7*   PT/INR No results for input(s): LABPROT, INR in the last 72 hours. Hepatitis Panel No results for input(s): HEPBSAG, HCVAB, HEPAIGM, HEPBIGM in the last 72 hours. C-Diff No results for input(s): CDIFFTOX in the last 72 hours. Fecal Lactopherrin No results for input(s): FECLLACTOFRN in the last 72 hours.  Studies/Results: MR 3D Recon At Scanner  Result Date: 05/16/2021 CLINICAL DATA:  Right upper quadrant pain, abnormal ultrasound equivocal for acute cholecystitis, cholelithiasis, possible choledocholithiasis EXAM: MRI ABDOMEN WITHOUT AND WITH CONTRAST (INCLUDING MRCP) TECHNIQUE: Multiplanar multisequence MR imaging of the abdomen was performed both before and after the administration of intravenous contrast. Heavily T2-weighted images of the biliary and pancreatic ducts  were obtained, and three-dimensional MRCP images were rendered by post processing. CONTRAST:  88mL GADAVIST GADOBUTROL 1 MMOL/ML IV SOLN COMPARISON:  Right upper quadrant ultrasound, 05/15/2021, CT abdomen pelvis, 05/15/2021 FINDINGS: Lower chest: No acute findings. Hepatobiliary: Simple, nonenhancing cyst of the inferior right lobe of the liver, hepatic segment VI (series 14, image 20). No mass or other parenchymal abnormality identified. Mildly distended gallbladder. Tiny gallstone near the gallbladder fundus (series 14, image 14). No gallbladder wall thickening. No biliary ductal dilatation or filling defect to the ampulla. Pancreas: No mass, inflammatory changes, or other parenchymal abnormality identified. No pancreatic ductal dilatation. Spleen:  Within normal limits in size and appearance. Adrenals/Urinary Tract: No masses identified. Status post right nephrectomy. No evidence of hydronephrosis. Stomach/Bowel: Visualized portions within the abdomen are unremarkable. Vascular/Lymphatic: No pathologically enlarged lymph nodes identified. No abdominal aortic aneurysm demonstrated. Other:  None. Musculoskeletal: No suspicious bone lesions identified. IMPRESSION: 1. Tiny gallstone near the gallbladder fundus. No gallbladder wall thickening or pericholecystic fluid. 2. No biliary ductal dilatation or filling defect to the ampulla. 3. Status post right nephrectomy. Electronically Signed   By: Eddie Candle M.D.   On: 05/16/2021 14:44   MR ABDOMEN MRCP W WO CONTAST  Result Date: 05/16/2021 CLINICAL DATA:  Right upper quadrant pain, abnormal ultrasound equivocal for acute cholecystitis, cholelithiasis, possible choledocholithiasis EXAM: MRI ABDOMEN WITHOUT AND WITH CONTRAST (INCLUDING MRCP) TECHNIQUE: Multiplanar multisequence MR imaging of the abdomen was performed both before and after the administration of intravenous contrast. Heavily T2-weighted images of the biliary and  pancreatic ducts were obtained, and  three-dimensional MRCP images were rendered by post processing. CONTRAST:  4mL GADAVIST GADOBUTROL 1 MMOL/ML IV SOLN COMPARISON:  Right upper quadrant ultrasound, 05/15/2021, CT abdomen pelvis, 05/15/2021 FINDINGS: Lower chest: No acute findings. Hepatobiliary: Simple, nonenhancing cyst of the inferior right lobe of the liver, hepatic segment VI (series 14, image 20). No mass or other parenchymal abnormality identified. Mildly distended gallbladder. Tiny gallstone near the gallbladder fundus (series 14, image 14). No gallbladder wall thickening. No biliary ductal dilatation or filling defect to the ampulla. Pancreas: No mass, inflammatory changes, or other parenchymal abnormality identified. No pancreatic ductal dilatation. Spleen:  Within normal limits in size and appearance. Adrenals/Urinary Tract: No masses identified. Status post right nephrectomy. No evidence of hydronephrosis. Stomach/Bowel: Visualized portions within the abdomen are unremarkable. Vascular/Lymphatic: No pathologically enlarged lymph nodes identified. No abdominal aortic aneurysm demonstrated. Other:  None. Musculoskeletal: No suspicious bone lesions identified. IMPRESSION: 1. Tiny gallstone near the gallbladder fundus. No gallbladder wall thickening or pericholecystic fluid. 2. No biliary ductal dilatation or filling defect to the ampulla. 3. Status post right nephrectomy. Electronically Signed   By: Eddie Candle M.D.   On: 05/16/2021 14:44    Medications:  Scheduled: . metoprolol tartrate  12.5 mg Oral BID  . pantoprazole  40 mg Oral BID  . sodium chloride flush  3 mL Intravenous Q12H   Continuous: . sodium chloride    . cefTRIAXone (ROCEPHIN)  IV 2 g (05/17/21 2248)  . dextrose 5 % and 0.45 % NaCl with KCl 20 mEq/L 75 mL/hr at 05/17/21 1027  . metronidazole 500 mg (05/18/21 0558)    Assessment/Plan: 1) Probable choledocholithiasis. 2) Elevated liver enzymes.   The patient's liver enzymes and TB markedly increased after  the lap chole and an IOC was not performed.  The MRCP the day prior was negative for any filling defects in the CBD.  There is a suspicion that she has a retained CBD stone(s).  The plan is to perform an ERCP.  The risks of bleeding, infection, perforation, and pancreatitis were discussed with the patient and she consents to the procedure.  Plan: 1) ERCP tomorrow.  LOS: 3 days   Jalene Lacko D 05/18/2021, 1:53 PM

## 2021-05-18 NOTE — Progress Notes (Signed)
PROGRESS NOTE    Samantha Roberson  MLY:[id number] DOB: January 08, 1961 DOA: 05/15/2021 PCP: Glendon Axe, MD   Brief Narrative:  The patient is a 60 year old obese Caucasian female with a past medical history significant for but not limited to asthma, history of status post right nephrectomy as well as other comorbidities who presented with abdominal pain and was worked up and found to have suspected acute calculus cholecystitis and elevated LFTs.  GI and general surgery were both consulted and MRCP was done which showed a tiny gallstone near the gallbladder fundus with no biliary ductal dilatation and no filling defect to the ampulla.  She underwent a laparoscopic cholecystectomy by general surgery Dr. Marcello Moores on 05/17/2021 however had no IOC.  Today her LFTs and bilirubin have elevated significantly so GI was contacted and planning for an ERCP tomorrow with Dr. Benson Norway.  Assessment & Plan:   Principal Problem:   Acute calculous cholecystitis Active Problems:   Elevated LFTs   Essential hypertension  Acute Calculus Cholecystitis status post laparoscopic cholecystectomy postoperative day 1 -Presented with abdominal pain -General surgery was consulted and plans for laparoscopic cholecystectomy today with IOC later -Continue supportive care with antiemetics -She is currently n.p.o. and will defer diet advancement to general surgery -Continue with pain control acetaminophen 650 mg p.o. every 6 as needed/RC, hydrocodone-acetaminophen 1-2 tabs p.o. every 4 as needed for moderate pain, and IV fentanyl 12.5 to 50 mcg every 2 as needed severe pain -Continue with antibiotic coverage with IV ceftriaxone/Flagyl for now -Continue with pantoprazole 40 mg p.o. twice daily for GERD -Continuing IV fluid hydration with D5 half-normal saline +20 mEQ of KCl at 75 MLS per hour  Abnormal LFT's/Elevated LFT's -Initially was trending down now trended up in the setting of cholecystectomy -MRCP done and showed a tiny  gallstone near the gallbladder fundus with no biliary ductal dilatation or filling defect to the ampulla -She underwent surgical intervention yesterday and AST at that time was 44 and trended up to 359 ALT went from 66 is now 254 -Has been consulted and planning for ERCP in the a.m. as no IOC was done with surgery yesterday -Repeat CMP in a.m.  Hyperbilirubinemia -T bili has been trending down and went from 1.6 down to 1.2 then trended up to 3.7 this morning -Could be reactive in the setting of her cholecystectomy however GIs been consulted and plan for ERCP in the morning Continue monitor and trend and repeat CMP in a.m.  Leukocytosis -Patient's T bili went from 6.5 is now 13.7 in the setting of her surgical intervention -Continue monitor trend and repeat CBC in the a.m.  GERD -C/w Pantoprazole 40 mg po BID  Asthma  -Allergy Induced -Stable -Continue to Monitor as she is not in Exacerbation  S/p Right Nephrectomy -BUN/Cr is relatively stable and gone from 16/1.07 -> 14/0.91 -> 10/1.02 -> 8/0.86 -Avoid further nephrotoxic medications, contrast dyes, hypotension and renally dose medications Can repeat CMP in a.m.  Small Nodular Densisties  -Noted along the Right Psoas Muscle and was seen on prior CT -Continue to Follow as an outpatient and have attention on follow up as an outpatient  Obesity -Complicates overall prognosis and care -Estimated body mass index is 33.23 kg/m as calculated from the following:   Height as of this encounter: 5\' 9"  (1.753 m).   Weight as of this encounter: 102.1 kg. -Weight Loss and Dietary Counseling given   DVT prophylaxis: SCDs Code Status: FULL CODE  Family Communication: No family present at bedside  Disposition Plan: Pending further Surgical evaluation and Clearance   Status is: Inpatient  Remains inpatient appropriate because:Unsafe d/c plan, IV treatments appropriate due to intensity of illness or inability to take PO and Inpatient level  of care appropriate due to severity of illness   Dispo: The patient is from: Home              Anticipated d/c is to: Home              Patient currently is not medically stable to d/c.   Difficult to place patient No   Consultants:   Gastroenterology; Now Dr. Benson Norway consulted   General Surgery   Procedures:  MRI/MRCP  Laparoscopic Cholecystectomy done by Dr. Marcello Moores and Dr. Johney Maine on 05/17/21  Antimicrobials:  Anti-infectives (From admission, onward)   Start     Dose/Rate Route Frequency Ordered Stop   05/16/21 2200  cefTRIAXone (ROCEPHIN) 2 g in sodium chloride 0.9 % 100 mL IVPB        2 g 200 mL/hr over 30 Minutes Intravenous Every 24 hours 05/16/21 1527     05/16/21 2200  metroNIDAZOLE (FLAGYL) IVPB 500 mg        500 mg 100 mL/hr over 60 Minutes Intravenous Every 8 hours 05/16/21 1527     05/15/21 2300  piperacillin-tazobactam (ZOSYN) IVPB 3.375 g  Status:  Discontinued        3.375 g 12.5 mL/hr over 240 Minutes Intravenous Every 8 hours 05/15/21 2205 05/16/21 1527        Subjective: Seen and examined at bedside and has some mild abdominal soreness but states that she has no more nausea.  Denies any lightheadedness or dizziness.  Has been ambulating today and had not been ambulating yesterday.  No other concerns or complaints this time.  Objective: Vitals:   05/17/21 2028 05/17/21 2132 05/17/21 2229 05/18/21 0235  BP: (!) 156/83 (!) 138/93 134/85 133/82  Pulse: 81 82 80 88  Resp: 18 18 18 18   Temp: 98 F (36.7 C) 98.2 F (36.8 C) 98.1 F (36.7 C) 98.5 F (36.9 C)  TempSrc: Oral Oral Oral Oral  SpO2: 99% 97% 94% 94%  Weight:      Height:        Intake/Output Summary (Last 24 hours) at 05/18/2021 1305 Last data filed at 05/18/2021 0901 Gross per 24 hour  Intake 2437.52 ml  Output 2315 ml  Net 122.52 ml   Filed Weights   05/15/21 1400  Weight: 102.1 kg   Examination: Physical Exam:  Constitutional: WN/WD obese Caucasian female in NAD and appears calm and  comfortable Eyes: Lids and conjunctivae normal, sclerae anicteric  ENMT: External Ears, Nose appear normal. Grossly normal hearing.  Neck: Appears normal, supple, no cervical masses, normal ROM, no appreciable thyromegaly; no JVD Respiratory: Diminished to auscultation bilaterally slightly, no wheezing, rales, rhonchi or crackles. Normal respiratory effort and patient is not tachypenic. No accessory muscle use.  Cardiovascular: RRR, no murmurs / rubs / gallops. S1 and S2 auscultated.  Abdomen: Soft, a little tender to palpate, Distended 2/2 to body habitys. Bowel sounds positive.  GU: Deferred. Musculoskeletal: No clubbing / cyanosis of digits/nails. No joint deformity upper and lower extremities.  Skin: No rashes, lesions, ulcers on a limited skin evaluation. No induration; Warm and dry.  Neurologic: CN 2-12 grossly intact with no focal deficits. Romberg sign and cerebellar reflexes not assessed.  Psychiatric: Normal judgment and insight. Alert and oriented x 3. Normal mood and appropriate affect.  Data Reviewed: I have personally reviewed following labs and imaging studies  CBC: Recent Labs  Lab 05/15/21 1459 05/16/21 0412 05/17/21 0427 05/18/21 0414  WBC 9.5 7.0 6.5 13.7*  NEUTROABS 6.4 3.9  --  12.4*  HGB 13.8 13.1 13.7 14.9  HCT 41.2 39.4 41.2 44.9  MCV 91.4 92.9 93.4 92.8  PLT 242 194 216 811   Basic Metabolic Panel: Recent Labs  Lab 05/15/21 1459 05/16/21 0412 05/17/21 0427 05/18/21 0414  NA 137 141 140 138  K 3.5 3.8 4.4 4.2  CL 103 107 104 100  CO2 26 28 31 29   GLUCOSE 114* 92 123* 133*  BUN 16 14 10 8   CREATININE 1.07* 0.91 1.02* 0.86  CALCIUM 9.2 9.0 8.9 9.4  MG  --  2.3  --  2.0  PHOS  --  4.4  --  2.9   GFR: Estimated Creatinine Clearance: 87.4 mL/min (by C-G formula based on SCr of 0.86 mg/dL). Liver Function Tests: Recent Labs  Lab 05/15/21 1459 05/16/21 0412 05/17/21 0427 05/18/21 0414  AST 69* 48* 44* 315*  ALT 83* 72* 66* 254*  ALKPHOS 163*  148* 140* 232*  BILITOT 0.8 1.6* 1.2 3.7*  PROT 6.9 6.3* 6.5 7.2  ALBUMIN 3.6 3.2* 3.3* 3.7   Recent Labs  Lab 05/15/21 1459  LIPASE 35   No results for input(s): AMMONIA in the last 168 hours. Coagulation Profile: No results for input(s): INR, PROTIME in the last 168 hours. Cardiac Enzymes: No results for input(s): CKTOTAL, CKMB, CKMBINDEX, TROPONINI in the last 168 hours. BNP (last 3 results) No results for input(s): PROBNP in the last 8760 hours. HbA1C: No results for input(s): HGBA1C in the last 72 hours. CBG: No results for input(s): GLUCAP in the last 168 hours. Lipid Profile: No results for input(s): CHOL, HDL, LDLCALC, TRIG, CHOLHDL, LDLDIRECT in the last 72 hours. Thyroid Function Tests: Recent Labs    05/16/21 0412  TSH 2.911   Anemia Panel: No results for input(s): VITAMINB12, FOLATE, FERRITIN, TIBC, IRON, RETICCTPCT in the last 72 hours. Sepsis Labs: No results for input(s): PROCALCITON, LATICACIDVEN in the last 168 hours.  Recent Results (from the past 240 hour(s))  Resp Panel by RT-PCR (Flu A&B, Covid) Nasopharyngeal Swab     Status: None   Collection Time: 05/15/21  7:03 PM   Specimen: Nasopharyngeal Swab; Nasopharyngeal(NP) swabs in vial transport medium  Result Value Ref Range Status   SARS Coronavirus 2 by RT PCR NEGATIVE NEGATIVE Final    Comment: (NOTE) SARS-CoV-2 target nucleic acids are NOT DETECTED.  The SARS-CoV-2 RNA is generally detectable in upper respiratory specimens during the acute phase of infection. The lowest concentration of SARS-CoV-2 viral copies this assay can detect is 138 copies/mL. A negative result does not preclude SARS-Cov-2 infection and should not be used as the sole basis for treatment or other patient management decisions. A negative result may occur with  improper specimen collection/handling, submission of specimen other than nasopharyngeal swab, presence of viral mutation(s) within the areas targeted by this assay,  and inadequate number of viral copies(<138 copies/mL). A negative result must be combined with clinical observations, patient history, and epidemiological information. The expected result is Negative.  Fact Sheet for Patients:  EntrepreneurPulse.com.au  Fact Sheet for Healthcare Providers:  IncredibleEmployment.be  This test is no t yet approved or cleared by the [place] FDA and  has been authorized for detection and/or diagnosis of SARS-CoV-2 by FDA under an Emergency Use Authorization (EUA). This EUA will  remain  in effect (meaning this test can be used) for the duration of the COVID-19 declaration under Section 564(b)(1) of the Act, 21 U.S.C.section 360bbb-3(b)(1), unless the authorization is terminated  or revoked sooner.       Influenza A by PCR NEGATIVE NEGATIVE Final   Influenza B by PCR NEGATIVE NEGATIVE Final    Comment: (NOTE) The Xpert Xpress SARS-CoV-2/FLU/RSV plus assay is intended as an aid in the diagnosis of influenza from Nasopharyngeal swab specimens and should not be used as a sole basis for treatment. Nasal washings and aspirates are unacceptable for Xpert Xpress SARS-CoV-2/FLU/RSV testing.  Fact Sheet for Patients: EntrepreneurPulse.com.au  Fact Sheet for Healthcare Providers: IncredibleEmployment.be  This test is not yet approved or cleared by the [place] FDA and has been authorized for detection and/or diagnosis of SARS-CoV-2 by FDA under an Emergency Use Authorization (EUA). This EUA will remain in effect (meaning this test can be used) for the duration of the COVID-19 declaration under Section 564(b)(1) of the Act, 21 U.S.C. section 360bbb-3(b)(1), unless the authorization is terminated or revoked.  Performed at [facility], [address] [place], [place] [address]   Surgical pcr screen     Status: None   Collection Time: 05/16/21  5:47 PM    Specimen: Nasal Mucosa; Nasal Swab  Result Value Ref Range Status   MRSA, PCR NEGATIVE NEGATIVE Final   Staphylococcus aureus NEGATIVE NEGATIVE Final    Comment: (NOTE) The Xpert SA Assay (FDA approved for NASAL specimens in patients 70 years of age and older), is one component of a comprehensive surveillance program. It is not intended to diagnose infection nor to guide or monitor treatment. Performed at [facility], [place] [address] [place], [place] [address]      RN Pressure Injury Documentation:     Estimated body mass index is 33.23 kg/m as calculated from the following:   Height as of this encounter: 5\' 9"  (1.753 m).   Weight as of this encounter: 102.1 kg.  Malnutrition Type:   Malnutrition Characteristics:   Nutrition Interventions:   Radiology Studies: MR 3D Recon At Scanner  Result Date: 05/16/2021 CLINICAL DATA:  Right upper quadrant pain, abnormal ultrasound equivocal for acute cholecystitis, cholelithiasis, possible choledocholithiasis EXAM: MRI ABDOMEN WITHOUT AND WITH CONTRAST (INCLUDING MRCP) TECHNIQUE: Multiplanar multisequence MR imaging of the abdomen was performed both before and after the administration of intravenous contrast. Heavily T2-weighted images of the biliary and pancreatic ducts were obtained, and three-dimensional MRCP images were rendered by post processing. CONTRAST:  31mL GADAVIST GADOBUTROL 1 MMOL/ML IV SOLN COMPARISON:  Right upper quadrant ultrasound, 05/15/2021, CT abdomen pelvis, 05/15/2021 FINDINGS: Lower chest: No acute findings. Hepatobiliary: Simple, nonenhancing cyst of the inferior right lobe of the liver, hepatic segment VI (series 14, image 20). No mass or other parenchymal abnormality identified. Mildly distended gallbladder. Tiny gallstone near the gallbladder fundus (series 14, image 14). No gallbladder wall thickening. No biliary ductal dilatation or filling defect to the ampulla. Pancreas: No mass, inflammatory  changes, or other parenchymal abnormality identified. No pancreatic ductal dilatation. Spleen:  Within normal limits in size and appearance. Adrenals/Urinary Tract: No masses identified. Status post right nephrectomy. No evidence of hydronephrosis. Stomach/Bowel: Visualized portions within the abdomen are unremarkable. Vascular/Lymphatic: No pathologically enlarged lymph nodes identified. No abdominal aortic aneurysm demonstrated. Other:  None. Musculoskeletal: No suspicious bone lesions identified. IMPRESSION: 1. Tiny gallstone near the gallbladder fundus. No gallbladder wall thickening or pericholecystic fluid. 2. No biliary ductal  dilatation or filling defect to the ampulla. 3. Status post right nephrectomy. Electronically Signed   By: Eddie Candle M.D.   On: 05/16/2021 14:44   MR ABDOMEN MRCP W WO CONTAST  Result Date: 05/16/2021 CLINICAL DATA:  Right upper quadrant pain, abnormal ultrasound equivocal for acute cholecystitis, cholelithiasis, possible choledocholithiasis EXAM: MRI ABDOMEN WITHOUT AND WITH CONTRAST (INCLUDING MRCP) TECHNIQUE: Multiplanar multisequence MR imaging of the abdomen was performed both before and after the administration of intravenous contrast. Heavily T2-weighted images of the biliary and pancreatic ducts were obtained, and three-dimensional MRCP images were rendered by post processing. CONTRAST:  60mL GADAVIST GADOBUTROL 1 MMOL/ML IV SOLN COMPARISON:  Right upper quadrant ultrasound, 05/15/2021, CT abdomen pelvis, 05/15/2021 FINDINGS: Lower chest: No acute findings. Hepatobiliary: Simple, nonenhancing cyst of the inferior right lobe of the liver, hepatic segment VI (series 14, image 20). No mass or other parenchymal abnormality identified. Mildly distended gallbladder. Tiny gallstone near the gallbladder fundus (series 14, image 14). No gallbladder wall thickening. No biliary ductal dilatation or filling defect to the ampulla. Pancreas: No mass, inflammatory changes, or other  parenchymal abnormality identified. No pancreatic ductal dilatation. Spleen:  Within normal limits in size and appearance. Adrenals/Urinary Tract: No masses identified. Status post right nephrectomy. No evidence of hydronephrosis. Stomach/Bowel: Visualized portions within the abdomen are unremarkable. Vascular/Lymphatic: No pathologically enlarged lymph nodes identified. No abdominal aortic aneurysm demonstrated. Other:  None. Musculoskeletal: No suspicious bone lesions identified. IMPRESSION: 1. Tiny gallstone near the gallbladder fundus. No gallbladder wall thickening or pericholecystic fluid. 2. No biliary ductal dilatation or filling defect to the ampulla. 3. Status post right nephrectomy. Electronically Signed   By: Eddie Candle M.D.   On: 05/16/2021 14:44   ECHOCARDIOGRAM COMPLETE  Result Date: 05/16/2021    ECHOCARDIOGRAM REPORT   Patient Name:   PAHOLA DIMMITT Date of Exam: 05/16/2021 Medical Rec #:  [id number]          Height:       69.0 in Accession #:    [id number]         Weight:       225.0 lb Date of Birth:  23-Jun-1961         BSA:          2.172 m Patient Age:    58 years           BP:           132/88 mmHg Patient Gender: F                  HR:           61 bpm. Exam Location:  Inpatient Procedure: 2D Echo, Cardiac Doppler and Color Doppler Indications:    Cardiomegaly I51.7  History:        Patient has no prior history of Echocardiogram examinations.                 Risk Factors:Hypertension.  Sonographer:    Bernadene Person RDCS Referring Phys: [place]  1. Left ventricular ejection fraction, by estimation, is 60 to 65%. The left ventricle has normal function. The left ventricle has no regional wall motion abnormalities. There is mild left ventricular hypertrophy. Left ventricular diastolic parameters were normal.  2. Right ventricular systolic function is normal. The right ventricular size is normal. There is normal pulmonary artery systolic pressure. The estimated  right ventricular systolic pressure is 93.2 mmHg.  3. The mitral valve is normal in structure. Trivial mitral  valve regurgitation.  4. The aortic valve is tricuspid. Aortic valve regurgitation is not visualized. No aortic stenosis is present.  5. The inferior vena cava is normal in size with greater than 50% respiratory variability, suggesting right atrial pressure of 3 mmHg. FINDINGS  Left Ventricle: Left ventricular ejection fraction, by estimation, is 60 to 65%. The left ventricle has normal function. The left ventricle has no regional wall motion abnormalities. The left ventricular internal cavity size was small. There is mild left ventricular hypertrophy. Left ventricular diastolic parameters were normal. Right Ventricle: The right ventricular size is normal. No increase in right ventricular wall thickness. Right ventricular systolic function is normal. There is normal pulmonary artery systolic pressure. The tricuspid regurgitant velocity is 2.32 m/s, and  with an assumed right atrial pressure of 3 mmHg, the estimated right ventricular systolic pressure is 32.4 mmHg. Left Atrium: Left atrial size was normal in size. Right Atrium: Right atrial size was normal in size. Pericardium: There is no evidence of pericardial effusion. Mitral Valve: The mitral valve is normal in structure. Trivial mitral valve regurgitation. Tricuspid Valve: The tricuspid valve is normal in structure. Tricuspid valve regurgitation is trivial. Aortic Valve: The aortic valve is tricuspid. Aortic valve regurgitation is not visualized. No aortic stenosis is present. Pulmonic Valve: The pulmonic valve was not well visualized. Pulmonic valve regurgitation is trivial. Aorta: The aortic root and ascending aorta are structurally normal, with no evidence of dilitation. Venous: The inferior vena cava is normal in size with greater than 50% respiratory variability, suggesting right atrial pressure of 3 mmHg. IAS/Shunts: The interatrial septum was not  well visualized.  LEFT VENTRICLE PLAX 2D LVIDd:         3.60 cm  Diastology LVIDs:         2.30 cm  LV e' medial:    8.03 cm/s LV PW:         2.15 cm  LV E/e' medial:  10.0 LV IVS:        0.90 cm  LV e' lateral:   10.80 cm/s LVOT diam:     2.10 cm  LV E/e' lateral: 7.5 LV SV:         67 LV SV Index:   31 LVOT Area:     3.46 cm  RIGHT VENTRICLE RV S prime:     9.45 cm/s TAPSE (M-mode): 2.4 cm LEFT ATRIUM             Index       RIGHT ATRIUM           Index LA diam:        2.70 cm 1.24 cm/m  RA Area:     16.40 cm LA Vol (A2C):   57.5 ml 26.47 ml/m RA Volume:   46.80 ml  21.55 ml/m LA Vol (A4C):   55.3 ml 25.46 ml/m LA Biplane Vol: 57.9 ml 26.66 ml/m  AORTIC VALVE LVOT Vmax:   87.80 cm/s LVOT Vmean:  58.300 cm/s LVOT VTI:    0.194 m  AORTA Ao Root diam: 3.30 cm MITRAL VALVE               TRICUSPID VALVE MV Area (PHT): 4.06 cm    TR Peak grad:   21.5 mmHg MV Decel Time: 187 msec    TR Vmax:        232.00 cm/s MV E velocity: 80.60 cm/s MV A velocity: 68.60 cm/s  SHUNTS MV E/A ratio:  1.17  Systemic VTI:  0.19 m                            Systemic Diam: 2.10 cm Oswaldo Milian MD Electronically signed by Oswaldo Milian MD Signature Date/Time: 05/16/2021/1:53:51 PM    Final    Scheduled Meds: . metoprolol tartrate  12.5 mg Oral BID  . pantoprazole  40 mg Oral BID  . sodium chloride flush  3 mL Intravenous Q12H   Continuous Infusions: . sodium chloride    . cefTRIAXone (ROCEPHIN)  IV 2 g (05/17/21 2248)  . dextrose 5 % and 0.45 % NaCl with KCl 20 mEq/L 75 mL/hr at 05/17/21 1027  . metronidazole 500 mg (05/18/21 0558)    LOS: 3 days   Kerney Elbe, DO [facility] PAGER is on [place]  If 7PM-7AM, please contact night-coverage [email or web address]

## 2021-05-19 ENCOUNTER — Encounter (HOSPITAL_COMMUNITY): Payer: Self-pay | Admitting: Internal Medicine

## 2021-05-19 ENCOUNTER — Inpatient Hospital Stay (HOSPITAL_COMMUNITY): Payer: BC Managed Care – PPO | Admitting: Certified Registered Nurse Anesthetist

## 2021-05-19 ENCOUNTER — Encounter (HOSPITAL_COMMUNITY)
Admission: EM | Disposition: A | Discharge status: Home / Self Care | Payer: Self-pay | Source: Home / Self Care | Attending: Internal Medicine

## 2021-05-19 ENCOUNTER — Inpatient Hospital Stay (HOSPITAL_COMMUNITY): Payer: BC Managed Care – PPO

## 2021-05-19 HISTORY — PX: SPHINCTEROTOMY: SHX5279

## 2021-05-19 HISTORY — PX: ERCP: SHX5425

## 2021-05-19 LAB — HEPATIC FUNCTION PANEL
ALT: 220 U/L — ABNORMAL HIGH (ref 0–44)
AST: 166 U/L — ABNORMAL HIGH (ref 15–41)
Albumin: 2.9 g/dL — ABNORMAL LOW (ref 3.5–5.0)
Alkaline Phosphatase: 230 U/L — ABNORMAL HIGH (ref 38–126)
Bilirubin, Direct: 2.3 mg/dL — ABNORMAL HIGH (ref 0.0–0.2)
Indirect Bilirubin: 1.6 mg/dL — ABNORMAL HIGH (ref 0.3–0.9)
Total Bilirubin: 3.9 mg/dL — ABNORMAL HIGH (ref 0.3–1.2)
Total Protein: 6 g/dL — ABNORMAL LOW (ref 6.5–8.1)

## 2021-05-19 LAB — CBC
HCT: 39 % (ref 36.0–46.0)
Hemoglobin: 12.8 g/dL (ref 12.0–15.0)
MCH: 30.8 pg (ref 26.0–34.0)
MCHC: 32.8 g/dL (ref 30.0–36.0)
MCV: 94 fL (ref 80.0–100.0)
Platelets: 204 10*3/uL (ref 150–400)
RBC: 4.15 MIL/uL (ref 3.87–5.11)
RDW: 12.8 % (ref 11.5–15.5)
WBC: 8.5 10*3/uL (ref 4.0–10.5)
nRBC: 0 % (ref 0.0–0.2)

## 2021-05-19 LAB — BASIC METABOLIC PANEL
Anion gap: 7 (ref 5–15)
BUN: 8 mg/dL (ref 8–23)
CO2: 25 mmol/L (ref 22–32)
Calcium: 8.5 mg/dL — ABNORMAL LOW (ref 8.9–10.3)
Chloride: 106 mmol/L (ref 98–111)
Creatinine, Ser: 0.92 mg/dL (ref 0.44–1.00)
GFR, Estimated: >60 mL/min (ref >60)
Glucose, Bld: 122 mg/dL — ABNORMAL HIGH (ref 70–99)
Potassium: 4 mmol/L (ref 3.5–5.1)
Sodium: 138 mmol/L (ref 135–145)

## 2021-05-19 LAB — SURGICAL PATHOLOGY

## 2021-05-19 SURGERY — ERCP, WITH INTERVENTION IF INDICATED
Anesthesia: General

## 2021-05-19 MED ORDER — GLUCAGON HCL RDNA (DIAGNOSTIC) 1 MG IJ SOLR
INTRAMUSCULAR | Status: AC
  Filled 2021-05-19: qty 1

## 2021-05-19 MED ORDER — DEXAMETHASONE SODIUM PHOSPHATE 10 MG/ML IJ SOLN
INTRAMUSCULAR | Status: DC | PRN
  Administered 2021-05-19: 10 mg via INTRAVENOUS

## 2021-05-19 MED ORDER — MIDAZOLAM HCL 2 MG/2ML IJ SOLN
INTRAMUSCULAR | Status: DC | PRN
  Administered 2021-05-19: 2 mg via INTRAVENOUS

## 2021-05-19 MED ORDER — LACTATED RINGERS IV SOLN: INTRAVENOUS | Status: DC | PRN

## 2021-05-19 MED ORDER — MIDAZOLAM HCL 2 MG/2ML IJ SOLN
INTRAMUSCULAR | Status: AC
  Filled 2021-05-19: qty 2

## 2021-05-19 MED ORDER — PROPOFOL 10 MG/ML IV BOLUS
INTRAVENOUS | Status: DC | PRN
  Administered 2021-05-19: 200 mg via INTRAVENOUS

## 2021-05-19 MED ORDER — CIPROFLOXACIN IN D5W 400 MG/200ML IV SOLN
INTRAVENOUS | Status: AC
  Filled 2021-05-19: qty 200

## 2021-05-19 MED ORDER — LIDOCAINE 2% (20 MG/ML) 5 ML SYRINGE
INTRAMUSCULAR | Status: DC | PRN
  Administered 2021-05-19: 60 mg via INTRAVENOUS

## 2021-05-19 MED ORDER — PROPOFOL 10 MG/ML IV BOLUS
INTRAVENOUS | Status: AC
  Filled 2021-05-19: qty 20

## 2021-05-19 MED ORDER — METHOCARBAMOL 500 MG PO TABS
500.0000 mg | ORAL_TABLET | Freq: Three times a day (TID) | ORAL | Status: DC
  Administered 2021-05-19 – 2021-05-20 (×3): 500 mg via ORAL
  Filled 2021-05-19 (×3): qty 1

## 2021-05-19 MED ORDER — INDOMETHACIN 50 MG RE SUPP
RECTAL | Status: AC
  Filled 2021-05-19: qty 2

## 2021-05-19 MED ORDER — ONDANSETRON HCL 4 MG/2ML IJ SOLN
INTRAMUSCULAR | Status: DC | PRN
  Administered 2021-05-19: 4 mg via INTRAVENOUS

## 2021-05-19 MED ORDER — PHENYLEPHRINE 40 MCG/ML (10ML) SYRINGE FOR IV PUSH (FOR BLOOD PRESSURE SUPPORT)
PREFILLED_SYRINGE | INTRAVENOUS | Status: DC | PRN
  Administered 2021-05-19: 120 ug via INTRAVENOUS

## 2021-05-19 MED ORDER — FENTANYL CITRATE (PF) 100 MCG/2ML IJ SOLN
INTRAMUSCULAR | Status: DC | PRN
  Administered 2021-05-19 (×2): 50 ug via INTRAVENOUS

## 2021-05-19 MED ORDER — SUCCINYLCHOLINE CHLORIDE 200 MG/10ML IV SOSY
PREFILLED_SYRINGE | INTRAVENOUS | Status: DC | PRN
  Administered 2021-05-19: 100 mg via INTRAVENOUS

## 2021-05-19 MED ORDER — SODIUM CHLORIDE 0.9 % IV SOLN
INTRAVENOUS | Status: DC | PRN
  Administered 2021-05-19: 15 mL

## 2021-05-19 MED ORDER — FENTANYL CITRATE (PF) 100 MCG/2ML IJ SOLN
INTRAMUSCULAR | Status: AC
  Filled 2021-05-19: qty 2

## 2021-05-19 NOTE — Interval H&P Note (Signed)
History and Physical Interval Note:  05/19/2021 10:43 AM  Samantha Roberson  has presented today for surgery, with the diagnosis of Choledocholithiasis.  The various methods of treatment have been discussed with the patient and family. After consideration of risks, benefits and other options for treatment, the patient has consented to  Procedure(s): ENDOSCOPIC RETROGRADE CHOLANGIOPANCREATOGRAPHY (ERCP) (N/A) as a surgical intervention.  The patient's history has been reviewed, patient examined, no change in status, stable for surgery.  I have reviewed the patient's chart and labs.  Questions were answered to the patient's satisfaction.     Cristiano Capri D

## 2021-05-19 NOTE — Progress Notes (Signed)
PROGRESS NOTE    Samantha Roberson  YFV:[id number] DOB: Oct 25, 1961 DOA: 05/15/2021 PCP: Glendon Axe, MD   Brief Narrative:  The patient is a 60 year old obese Caucasian female with a past medical history significant for but not limited to asthma, history of status post right nephrectomy as well as other comorbidities who presented with abdominal pain and was worked up and found to have suspected acute calculus cholecystitis and elevated LFTs.  GI and general surgery were both consulted and MRCP was done which showed a tiny gallstone near the gallbladder fundus with no biliary ductal dilatation and no filling defect to the ampulla.  She underwent a laparoscopic cholecystectomy by general surgery Dr. Marcello Moores on 05/17/2021 however had no IOC.  Yesterday her LFTs and bilirubin had elevated significantly so GI was contacted and patient underwent an ERCP this AM which showed that the major papilla appeared normal and the common bile duct was mildly dilated of uncertain etiology but the patient had a cholecystectomy.  Biliary sphincterotomy was performed and the biliary tree was swept and nothing was found.   Assessment & Plan:   Principal Problem:   Acute calculous cholecystitis Active Problems:   Elevated LFTs   Essential hypertension  Acute Calculus Cholecystitis status post laparoscopic cholecystectomy postoperative day 2 -Presented with abdominal pain -General surgery was consulted and plans for laparoscopic cholecystectomy today with IOC later -Continue supportive care with antiemetics -She is currently n.p.o. and will defer diet advancement to general surgery and gastroenterology -Continue with pain control acetaminophen 650 mg p.o. every 6 as needed/RC, hydrocodone-acetaminophen 1-2 tabs p.o. every 4 as needed for moderate pain, and IV fentanyl 12.5 to 50 mcg every 2 as needed severe pain -Continue with antibiotic coverage with IV ceftriaxone/Flagyl for now -Continue with pantoprazole 40 mg  p.o. twice daily for GERD -Continuing IV fluid hydration with D5 half-normal saline +20 mEQ of KCl at 75 MLS per hour likely will stop  Abnormal LFT's/Elevated LFT's, improving slightly -Initially was trending down now trended up in the setting of cholecystectomy -MRCP done and showed a tiny gallstone near the gallbladder fundus with no biliary ductal dilatation or filling defect to the ampulla -She underwent surgical intervention yesterday and AST at that time was 44 and trended up to 315 and today it is 166 ALT went from 66 is now 75 yesterday and today it is 220 -Has been consulted and planning for ERCP in the a.m. as no IOC was done with surgery yesterday -Repeat CMP in a.m.  Hyperbilirubinemia -T bili had been trending down and went from 1.6 down to 1.2 then trended up to 3.7 yesterday morning and this morning it was 3.9 with a indirect bilirubin of 1.6 and a direct bilirubin of 2.3 -Could be reactive in the setting of her cholecystectomy however GIs been consulted performed an EGD this morning which showed that the major papilla appeared normal and that the common bile duct was mildly dilated of unclear etiology.  A bilious enterotomy was performed and the biliary tree was swept and t nothing was found -Continue monitor and trend and repeat CMP in a.m.  Leukocytosis -Patient's T bili went from 6.5 is now 13.7 in the setting of her surgical intervention and is improved back to 8.5 and normal -Continue monitor trend and repeat CBC in the a.m.  GERD -C/w Pantoprazole 40 mg po BID  Asthma  -Allergy Induced -Stable -Continue to Monitor as she is not in Exacerbation  S/p Right Nephrectomy -BUN/Cr is relatively stable and gone  from 16/1.07 -> 14/0.91 -> 10/1.02 -> 8/0.86 today is 8/0.9 to -Avoid further nephrotoxic medications, contrast dyes, hypotension and renally dose medications Can repeat CMP in a.m.  Small Nodular Densisties  -Noted along the Right Psoas Muscle and was seen on  prior CT -Continue to Follow as an outpatient and have attention on follow up as an outpatient  Obesity -Complicates overall prognosis and care -Estimated body mass index is 33.23 kg/m as calculated from the following:   Height as of this encounter: 5\' 9"  (1.753 m).   Weight as of this encounter: 102.1 kg. -Weight Loss and Dietary Counseling given   DVT prophylaxis: SCDs Code Status: FULL CODE  Family Communication: Husband was at bedside when she is down for her procedure Disposition Plan: Pending further Surgical evaluation and GI evaluation and clearance   Status is: Inpatient  Remains inpatient appropriate because:Unsafe d/c plan, IV treatments appropriate due to intensity of illness or inability to take PO and Inpatient level of care appropriate due to severity of illness   Dispo: The patient is from: Home              Anticipated d/c is to: Home              Patient currently is not medically stable to d/c.   Difficult to place patient No   Consultants:   Gastroenterology; Now Dr. Benson Norway consulted   General Surgery   Procedures:  MRI/MRCP  Laparoscopic Cholecystectomy done by Dr. Marcello Moores and Dr. Johney Maine on 05/17/21  ERCP Findings:      The major papilla was normal. The bile duct was deeply cannulated with       the short-nosed traction sphincterotome. Contrast was injected. I       personally interpreted the bile duct images. There was brisk flow of       contrast through the ducts. Image quality was excellent. Contrast       extended to the hepatic ducts. The common bile duct was mildly dilated       and diffusely dilated, uncertain etiology. The largest diameter was 9       mm. A cholecystectomy had been performed. A short 0.035 inch Soft       Jagwire was passed into the biliary tree. A 10 mm biliary sphincterotomy       was made with a monofilament traction (standard) sphincterotome using       ERBE electrocautery. There was no post-sphincterotomy bleeding. The        biliary tree was swept with a 12 mm balloon starting at the bifurcation.       Nothing was found.      The CBD was easily cannulated on the first attempt using a full bow of       the sphincterotome. The guidewire was secured in the right intrahepatic       ducts. Contrast injection showed a mildly dilated CBD and normal       intrahepatic ducts. There was no evidence of any cystic duct leak or       leak from the accessory ducts. The CBD was swept three times after the       sphinctertomy. There was no evidence of any stones or sludge. The final       occlusion cholangiogram was negative for any retained stones. Impression:               - The major papilla appeared normal.                           -  The common bile duct was mildly dilated,                            uncertain etiology.                           - The patient has had a cholecystectomy.                           - A biliary sphincterotomy was performed.                           - The biliary tree was swept and nothing was found.  Antimicrobials:  Anti-infectives (From admission, onward)   Start     Dose/Rate Route Frequency Ordered Stop   05/16/21 2200  cefTRIAXone (ROCEPHIN) 2 g in sodium chloride 0.9 % 100 mL IVPB        2 g 200 mL/hr over 30 Minutes Intravenous Every 24 hours 05/16/21 1527     05/16/21 2200  metroNIDAZOLE (FLAGYL) IVPB 500 mg        500 mg 100 mL/hr over 60 Minutes Intravenous Every 8 hours 05/16/21 1527     05/15/21 2300  piperacillin-tazobactam (ZOSYN) IVPB 3.375 g  Status:  Discontinued        3.375 g 12.5 mL/hr over 240 Minutes Intravenous Every 8 hours 05/15/21 2205 05/16/21 1527        Subjective: Seen and examined at bedside and had come back from her ERCP and was not nauseous.  Denies any lightheadedness or dizziness.  Felt okay.  Has been ambulating.  No lightheadedness or dizziness.  No other concerns or complaints at this time  Objective: Vitals:   05/19/21 1150 05/19/21 1200  05/19/21 1210 05/19/21 1253  BP: (!) 164/90 (!) 165/87 (!) 167/90 (!) 154/90  Pulse: 93 89 81 79  Resp: 11 17 13 16   Temp: 98.2 F (36.8 C)   97.6 F (36.4 C)  TempSrc: Oral   Oral  SpO2: 91% 92% 98% 97%  Weight:      Height:        Intake/Output Summary (Last 24 hours) at 05/19/2021 1300 Last data filed at 05/19/2021 1135 Gross per 24 hour  Intake 3393.66 ml  Output 2975 ml  Net 418.66 ml   Filed Weights   05/15/21 1400 05/19/21 1034  Weight: 102.1 kg 102.1 kg   Examination: Physical Exam:  Constitutional: WN/WD obese Caucasian female currently in NAD and appears calm and comfortable Eyes: Lids and conjunctivae normal, sclerae anicteric  ENMT: External Ears, Nose appear normal. Grossly normal hearing. Neck: Appears normal, supple, no cervical masses, normal ROM, no appreciable thyromegaly; no JVD Respiratory: Diminished to auscultation bilaterally, no wheezing, rales, rhonchi or crackles. Normal respiratory effort and patient is not tachypenic. No accessory muscle use.  Unlabored breathing Cardiovascular: RRR, no murmurs / rubs / gallops. S1 and S2 auscultated.  Minimal extremity edema Abdomen: Soft, very minimally tender, distended secondary body habitus. Bowel sounds positive.  GU: Deferred. Musculoskeletal: No clubbing / cyanosis of digits/nails. No joint deformity upper and lower extremities.  Skin: No rashes, lesions, ulcers on limited skin evaluation. No induration; Warm and dry.  Neurologic: CN 2-12 grossly intact with no focal deficits. Romberg sign and cerebellar reflexes not assessed.  Psychiatric: Normal judgment and insight. Alert and oriented x 3. Normal  mood and appropriate affect.   Data Reviewed: I have personally reviewed following labs and imaging studies  CBC: Recent Labs  Lab 05/15/21 1459 05/16/21 0412 05/17/21 0427 05/18/21 0414 05/19/21 0401  WBC 9.5 7.0 6.5 13.7* 8.5  NEUTROABS 6.4 3.9  --  12.4*  --   HGB 13.8 13.1 13.7 14.9 12.8  HCT 41.2  39.4 41.2 44.9 39.0  MCV 91.4 92.9 93.4 92.8 94.0  PLT 242 194 216 236 295   Basic Metabolic Panel: Recent Labs  Lab 05/15/21 1459 05/16/21 0412 05/17/21 0427 05/18/21 0414 05/19/21 0401  NA 137 141 140 138 138  K 3.5 3.8 4.4 4.2 4.0  CL 103 107 104 100 106  CO2 26 28 31 29 25   GLUCOSE 114* 92 123* 133* 122*  BUN 16 14 10 8 8   CREATININE 1.07* 0.91 1.02* 0.86 0.92  CALCIUM 9.2 9.0 8.9 9.4 8.5*  MG  --  2.3  --  2.0  --   PHOS  --  4.4  --  2.9  --    GFR: Estimated Creatinine Clearance: 81.7 mL/min (by C-G formula based on SCr of 0.92 mg/dL). Liver Function Tests: Recent Labs  Lab 05/15/21 1459 05/16/21 0412 05/17/21 0427 05/18/21 0414 05/19/21 0401  AST 69* 48* 44* 315* 166*  ALT 83* 72* 66* 254* 220*  ALKPHOS 163* 148* 140* 232* 230*  BILITOT 0.8 1.6* 1.2 3.7* 3.9*  PROT 6.9 6.3* 6.5 7.2 6.0*  ALBUMIN 3.6 3.2* 3.3* 3.7 2.9*   Recent Labs  Lab 05/15/21 1459  LIPASE 35   No results for input(s): AMMONIA in the last 168 hours. Coagulation Profile: No results for input(s): INR, PROTIME in the last 168 hours. Cardiac Enzymes: No results for input(s): CKTOTAL, CKMB, CKMBINDEX, TROPONINI in the last 168 hours. BNP (last 3 results) No results for input(s): PROBNP in the last 8760 hours. HbA1C: No results for input(s): HGBA1C in the last 72 hours. CBG: No results for input(s): GLUCAP in the last 168 hours. Lipid Profile: No results for input(s): CHOL, HDL, LDLCALC, TRIG, CHOLHDL, LDLDIRECT in the last 72 hours. Thyroid Function Tests: No results for input(s): TSH, T4TOTAL, FREET4, T3FREE, THYROIDAB in the last 72 hours. Anemia Panel: No results for input(s): VITAMINB12, FOLATE, FERRITIN, TIBC, IRON, RETICCTPCT in the last 72 hours. Sepsis Labs: No results for input(s): PROCALCITON, LATICACIDVEN in the last 168 hours.  Recent Results (from the past 240 hour(s))  Resp Panel by RT-PCR (Flu A&B, Covid) Nasopharyngeal Swab     Status: None   Collection Time:  05/15/21  7:03 PM   Specimen: Nasopharyngeal Swab; Nasopharyngeal(NP) swabs in vial transport medium  Result Value Ref Range Status   SARS Coronavirus 2 by RT PCR NEGATIVE NEGATIVE Final    Comment: (NOTE) SARS-CoV-2 target nucleic acids are NOT DETECTED.  The SARS-CoV-2 RNA is generally detectable in upper respiratory specimens during the acute phase of infection. The lowest concentration of SARS-CoV-2 viral copies this assay can detect is 138 copies/mL. A negative result does not preclude SARS-Cov-2 infection and should not be used as the sole basis for treatment or other patient management decisions. A negative result may occur with  improper specimen collection/handling, submission of specimen other than nasopharyngeal swab, presence of viral mutation(s) within the areas targeted by this assay, and inadequate number of viral copies(<138 copies/mL). A negative result must be combined with clinical observations, patient history, and epidemiological information. The expected result is Negative.  Fact Sheet for Patients:  EntrepreneurPulse.com.au  Fact Sheet  for Healthcare Providers:  IncredibleEmployment.be  This test is no t yet approved or cleared by the [place] and  has been authorized for detection and/or diagnosis of SARS-CoV-2 by FDA under an Emergency Use Authorization (EUA). This EUA will remain  in effect (meaning this test can be used) for the duration of the COVID-19 declaration under Section 564(b)(1) of the Act, 21 U.S.C.section 360bbb-3(b)(1), unless the authorization is terminated  or revoked sooner.       Influenza A by PCR NEGATIVE NEGATIVE Final   Influenza B by PCR NEGATIVE NEGATIVE Final    Comment: (NOTE) The Xpert Xpress SARS-CoV-2/FLU/RSV plus assay is intended as an aid in the diagnosis of influenza from Nasopharyngeal swab specimens and should not be used as a sole basis for treatment. Nasal washings  and aspirates are unacceptable for Xpert Xpress SARS-CoV-2/FLU/RSV testing.  Fact Sheet for Patients: EntrepreneurPulse.com.au  Fact Sheet for Healthcare Providers: IncredibleEmployment.be  This test is not yet approved or cleared by the [place] FDA and has been authorized for detection and/or diagnosis of SARS-CoV-2 by FDA under an Emergency Use Authorization (EUA). This EUA will remain in effect (meaning this test can be used) for the duration of the COVID-19 declaration under Section 564(b)(1) of the Act, 21 U.S.C. section 360bbb-3(b)(1), unless the authorization is terminated or revoked.  Performed at [facility], [address], [place], [place] [address]   Surgical pcr screen     Status: None   Collection Time: 05/16/21  5:47 PM   Specimen: Nasal Mucosa; Nasal Swab  Result Value Ref Range Status   MRSA, PCR NEGATIVE NEGATIVE Final   Staphylococcus aureus NEGATIVE NEGATIVE Final    Comment: (NOTE) The Xpert SA Assay (FDA approved for NASAL specimens in patients 63 years of age and older), is one component of a comprehensive surveillance program. It is not intended to diagnose infection nor to guide or monitor treatment. Performed at [facility], [place] [address] [place], [place] [address]      RN Pressure Injury Documentation:     Estimated body mass index is 33.23 kg/m as calculated from the following:   Height as of this encounter: 5\' 9"  (1.753 m).   Weight as of this encounter: 102.1 kg.  Malnutrition Type:   Malnutrition Characteristics:   Nutrition Interventions:   Radiology Studies: DG ERCP  Result Date: 05/19/2021 CLINICAL DATA:  Right upper quadrant pain, cholelithiasis, concern for choledocholithiasis EXAM: ERCP with balloon sweep TECHNIQUE: Multiple spot images obtained with the fluoroscopic device and submitted for interpretation post-procedure. FLUOROSCOPY TIME:  Fluoroscopy  Time:  1 minutes 37 seconds Radiation Exposure Index (if provided by the fluoroscopic device): 28 mGy Number of Acquired Spot Images: 3 COMPARISON:  05/16/2021 FINDINGS: Biliary confluence, common hepatic duct, common bile duct appear patent. No focal occlusion, stricture, or large filling defect. Balloon sweep performed. Cholecystectomy noted. IMPRESSION: Patent biliary tree. These images were submitted for radiologic interpretation only. Please see the procedural report for the amount of contrast and the fluoroscopy time utilized. Electronically Signed   By: Jerilynn Mages.  Shick M.D.   On: 05/19/2021 12:32   Scheduled Meds: . methocarbamol  500 mg Oral TID  . metoprolol tartrate  12.5 mg Oral BID  . pantoprazole  40 mg Oral BID  . sodium chloride flush  3 mL Intravenous Q12H   Continuous Infusions: . sodium chloride    . cefTRIAXone (ROCEPHIN)  IV Stopped (05/19/21 0727)  . dextrose 5 % and 0.45 %  NaCl with KCl 20 mEq/L 75 mL/hr at 05/18/21 1428  . metronidazole 500 mg (05/19/21 0548)    LOS: 4 days   Kerney Elbe, DO [facility] PAGER is on [place]  If 7PM-7AM, please contact night-coverage [email or web address]

## 2021-05-19 NOTE — Progress Notes (Signed)
Progress Note  2 Days Post-Op  Subjective: CC: some abdominal soreness. NPO currently for ERCP but has been tolerating diet. No nausea, emesis. Passing flatus. No BM yet. Ambulating  Objective: Vital signs in last 24 hours: Temp:  [97.3 F (36.3 C)-98.2 F (36.8 C)] 97.9 F (36.6 C) (06/03 0651) Pulse Rate:  [79-101] 85 (06/03 0651) Resp:  [18] 18 (06/03 0651) BP: (123-130)/(77-82) 130/78 (06/03 0651) SpO2:  [94 %-98 %] 98 % (06/03 0651) Last BM Date: 05/15/21  Intake/Output from previous day: 06/02 0701 - 06/03 0700 In: 3353.7 [P.O.:1440; I.V.:1592.5; IV Piggyback:321.1] Out: 3300 [Urine:3300] Intake/Output this shift: No intake/output data recorded.  PE: General: pleasant, WD, female who is laying in bed in NAD HEENT: head is normocephalic, atraumatic.  Mouth is pink and moist Heart:Palpable radial pulses bilaterally Lungs: no cyanosis. Respiratory effort nonlabored on room air Abd: soft, ND, +BS, mild postoperative tenderness, Incisions with surgical glue intact - no erythema or discharge MS: no calf TTP Skin: warm and dry with no masses, lesions, or rashes Psych: A&Ox3 with an appropriate affect.    Lab Results:  Recent Labs    05/18/21 0414 05/19/21 0401  WBC 13.7* 8.5  HGB 14.9 12.8  HCT 44.9 39.0  PLT 236 204   BMET Recent Labs    05/18/21 0414 05/19/21 0401  NA 138 138  K 4.2 4.0  CL 100 106  CO2 29 25  GLUCOSE 133* 122*  BUN 8 8  CREATININE 0.86 0.92  CALCIUM 9.4 8.5*   PT/INR No results for input(s): LABPROT, INR in the last 72 hours. CMP     Component Value Date/Time   NA 138 05/19/2021 0401   K 4.0 05/19/2021 0401   CL 106 05/19/2021 0401   CO2 25 05/19/2021 0401   GLUCOSE 122 (H) 05/19/2021 0401   BUN 8 05/19/2021 0401   CREATININE 0.92 05/19/2021 0401   CALCIUM 8.5 (L) 05/19/2021 0401   PROT 6.0 (L) 05/19/2021 0401   ALBUMIN 2.9 (L) 05/19/2021 0401   AST 166 (H) 05/19/2021 0401   ALT 220 (H) 05/19/2021 0401   ALKPHOS 230  (H) 05/19/2021 0401   BILITOT 3.9 (H) 05/19/2021 0401   GFRNONAA >60 05/19/2021 0401   GFRAA >60 12/14/2018 0950   Lipase     Component Value Date/Time   LIPASE 35 05/15/2021 1459       Studies/Results: No results found.  Anti-infectives: Anti-infectives (From admission, onward)   Start     Dose/Rate Route Frequency Ordered Stop   05/16/21 2200  cefTRIAXone (ROCEPHIN) 2 g in sodium chloride 0.9 % 100 mL IVPB        2 g 200 mL/hr over 30 Minutes Intravenous Every 24 hours 05/16/21 1527     05/16/21 2200  metroNIDAZOLE (FLAGYL) IVPB 500 mg        500 mg 100 mL/hr over 60 Minutes Intravenous Every 8 hours 05/16/21 1527     05/15/21 2300  piperacillin-tazobactam (ZOSYN) IVPB 3.375 g  Status:  Discontinued        3.375 g 12.5 mL/hr over 240 Minutes Intravenous Every 8 hours 05/15/21 2205 05/16/21 1527       Assessment/Plan Symptomatic cholelithiasis - POD 2 laparoscopic cholecystomy - AT - pre op MRCP was negative. Pre op bilirubin normal and no IOC performed - Bilirubin 3.9 (3.7) - ERCP today per GI - ambulate  FEN: NPO ID: rocephin, flagyl - no further abx needed from our perspective VTE: SCDs   HTN H/o R  nephrectomy  Stable for discharge from our standpoint pending ERCP    LOS: 4 days    Winferd Humphrey, [facility] Surgery 05/19/2021, 8:57 AM Please see Amion for pager number during day hours 7:00am-4:30pm

## 2021-05-19 NOTE — Op Note (Signed)
[facility] Patient Name: Samantha Roberson Procedure Date: 05/19/2021 MRN: [id number] Attending MD: Carol Ada , MD Date of Birth: Apr 22, 1961 CSN: [id number] Age: 60 Admit Type: Inpatient Procedure:                ERCP Indications:              Common bile duct stone(s) Providers:                Carol Ada, MD, Baird Cancer, RN, Tyrone Apple, Technician Referring MD:              Medicines:                General Anesthesia Complications:            No immediate complications. Estimated Blood Loss:     Estimated blood loss was minimal. Procedure:                Pre-Anesthesia Assessment:                           - Prior to the procedure, a History and Physical                            was performed, and patient medications and                            allergies were reviewed. The patient's tolerance of                            previous anesthesia was also reviewed. The risks                            and benefits of the procedure and the sedation                            options and risks were discussed with the patient.                            All questions were answered, and informed consent                            was obtained. Prior Anticoagulants: The patient has                            taken no previous anticoagulant or antiplatelet                            agents. ASA Grade Assessment: II - A patient with                            mild systemic disease. After reviewing the risks                            and  benefits, the patient was deemed in                            satisfactory condition to undergo the procedure.                           - Sedation was administered by an anesthesia                            professional. General anesthesia was attained.                           After obtaining informed consent, the scope was                            passed under direct vision. Throughout the                             procedure, the patient's blood pressure, pulse, and                            oxygen saturations were monitored continuously. The                            Olympus TJF-Q180V [id number]) was introduced through                            the mouth, and used to inject contrast into and                            used to inject contrast into the bile duct. The                            ERCP was accomplished without difficulty. The                            patient tolerated the procedure well. Scope In: Scope Out: Findings:      The major papilla was normal. The bile duct was deeply cannulated with       the short-nosed traction sphincterotome. Contrast was injected. I       personally interpreted the bile duct images. There was brisk flow of       contrast through the ducts. Image quality was excellent. Contrast       extended to the hepatic ducts. The common bile duct was mildly dilated       and diffusely dilated, uncertain etiology. The largest diameter was 9       mm. A cholecystectomy had been performed. A short 0.035 inch Soft       Jagwire was passed into the biliary tree. A 10 mm biliary sphincterotomy       was made with a monofilament traction (standard) sphincterotome using       ERBE electrocautery. There was no post-sphincterotomy bleeding. The       biliary tree was swept with a 12 mm balloon starting at the bifurcation.       Nothing  was found.      The CBD was easily cannulated on the first attempt using a full bow of       the sphincterotome. The guidewire was secured in the right intrahepatic       ducts. Contrast injection showed a mildly dilated CBD and normal       intrahepatic ducts. There was no evidence of any cystic duct leak or       leak from the accessory ducts. The CBD was swept three times after the       sphinctertomy. There was no evidence of any stones or sludge. The final       occlusion cholangiogram was negative for any retained  stones. Impression:               - The major papilla appeared normal.                           - The common bile duct was mildly dilated,                            uncertain etiology.                           - The patient has had a cholecystectomy.                           - A biliary sphincterotomy was performed.                           - The biliary tree was swept and nothing was found. Moderate Sedation:      Not Applicable - Patient had care per Anesthesia. Recommendation:           - Return patient to hospital ward for ongoing care.                           - Monitor liver panel.                           - Post op management per Surgery.                           - PRN follow up with Dr. Collene Mares. Procedure Code(s):        --- Professional ---                           [phone], Endoscopic retrograde                            cholangiopancreatography (ERCP); with                            sphincterotomy/papillotomy Diagnosis Code(s):        --- Professional ---                           K80.50, Calculus of bile duct without cholangitis  or cholecystitis without obstruction                           Z90.49, Acquired absence of other specified parts                            of digestive tract                           K83.8, Other specified diseases of biliary tract CPT copyright 2019 [employer]. All rights reserved. The codes documented in this report are preliminary and upon coder review may  be revised to meet current compliance requirements. Carol Ada, MD Carol Ada, MD 05/19/2021 11:47:01 AM This report has been signed electronically. Number of Addenda: 0

## 2021-05-19 NOTE — Anesthesia Postprocedure Evaluation (Signed)
Anesthesia Post Note  Patient: Samantha Roberson  Procedure(s) Performed: ENDOSCOPIC RETROGRADE CHOLANGIOPANCREATOGRAPHY (ERCP) (N/A ) SPHINCTEROTOMY     Patient location during evaluation: PACU Anesthesia Type: General Level of consciousness: awake and alert Pain management: pain level controlled Vital Signs Assessment: post-procedure vital signs reviewed and stable Respiratory status: spontaneous breathing, nonlabored ventilation, respiratory function stable and patient connected to nasal cannula oxygen Cardiovascular status: blood pressure returned to baseline and stable Postop Assessment: no apparent nausea or vomiting Anesthetic complications: no   No complications documented.  Last Vitals:  Vitals:   05/19/21 1210 05/19/21 1253  BP: (!) 167/90 (!) 154/90  Pulse: 81 79  Resp: 13 16  Temp:  36.4 C  SpO2: 98% 97%    Last Pain:  Vitals:   05/19/21 1253  TempSrc: Oral  PainSc:                  [name]

## 2021-05-19 NOTE — Transfer of Care (Signed)
Immediate Anesthesia Transfer of Care Note  Patient: Lynnett Langlinais Wearing  Procedure(s) Performed: ENDOSCOPIC RETROGRADE CHOLANGIOPANCREATOGRAPHY (ERCP) (N/A ) SPHINCTEROTOMY  Patient Location: Endoscopy Unit  Anesthesia Type:General  Level of Consciousness: awake, alert  and oriented  Airway & Oxygen Therapy: Patient Spontanous Breathing and Patient connected to face mask oxygen  Post-op Assessment: Report given to RN and Post -op Vital signs reviewed and stable  Post vital signs: Reviewed and stable  Last Vitals:  Vitals Value Taken Time  BP 164/90 05/19/21 1150  Temp    Pulse 93 05/19/21 1152  Resp 15 05/19/21 1152  SpO2 99 % 05/19/21 1152  Vitals shown include unvalidated device data.  Last Pain:  Vitals:   05/19/21 1150  TempSrc:   PainSc: 0-No pain      Patients Stated Pain Goal: 0 (17/40/81 [id number])  Complications: No complications documented.

## 2021-05-19 NOTE — Anesthesia Preprocedure Evaluation (Signed)
Anesthesia Evaluation  Patient identified by MRN, date of birth, ID band Patient awake    Reviewed: Allergy & Precautions, NPO status , Patient's Chart, lab work & pertinent test results, reviewed documented beta blocker date and time   Airway Mallampati: II  TM Distance: >3 FB Neck ROM: Full    Dental  (+) Missing, Poor Dentition, Dental Advisory Given   Pulmonary neg pulmonary ROS,    Pulmonary exam normal breath sounds clear to auscultation       Cardiovascular hypertension, Pt. on home beta blockers and Pt. on medications Normal cardiovascular exam Rhythm:Regular Rate:Normal  Untreated HTN   Neuro/Psych negative neurological ROS  negative psych ROS   GI/Hepatic negative GI ROS, Neg liver ROS,   Endo/Other  negative endocrine ROS  Renal/GU negative Renal ROS     Musculoskeletal negative musculoskeletal ROS (+)   Abdominal (+) + obese,   Peds  Hematology negative hematology ROS (+)   Anesthesia Other Findings   Reproductive/Obstetrics negative OB ROS                             Anesthesia Physical  Anesthesia Plan  ASA: III  Anesthesia Plan: General   Post-op Pain Management:    Induction: Intravenous  PONV Risk Score and Plan: 3 and Ondansetron, Dexamethasone, Treatment may vary due to age or medical condition and Midazolam  Airway Management Planned: Oral ETT  Additional Equipment: None  Intra-op Plan:   Post-operative Plan: Extubation in OR  Informed Consent: I have reviewed the patients History and Physical, chart, labs and discussed the procedure including the risks, benefits and alternatives for the proposed anesthesia with the patient or authorized representative who has indicated his/her understanding and acceptance.     Dental advisory given  Plan Discussed with: CRNA  Anesthesia Plan Comments:         Anesthesia Quick Evaluation

## 2021-05-19 NOTE — Anesthesia Procedure Notes (Signed)
Procedure Name: Intubation Date/Time: 05/19/2021 11:07 AM Performed by: Genelle Bal, CRNA Pre-anesthesia Checklist: Patient identified, Emergency Drugs available, Suction available and Patient being monitored Patient Re-evaluated:Patient Re-evaluated prior to induction Oxygen Delivery Method: Circle system utilized Preoxygenation: Pre-oxygenation with 100% oxygen Induction Type: IV induction Ventilation: Mask ventilation without difficulty Laryngoscope Size: Miller and 2 Grade View: Grade I Tube type: Oral Tube size: 7.0 mm Number of attempts: 1 Airway Equipment and Method: Stylet and Oral airway Placement Confirmation: ETT inserted through vocal cords under direct vision,  positive ETCO2 and breath sounds checked- equal and bilateral Secured at: 21 cm Tube secured with: Tape Dental Injury: Teeth and Oropharynx as per pre-operative assessment

## 2021-05-20 LAB — CBC WITH DIFFERENTIAL/PLATELET
Abs Immature Granulocytes: 0.07 10*3/uL (ref 0.00–0.07)
Basophils Absolute: 0 10*3/uL (ref 0.0–0.1)
Basophils Relative: 0 %
Eosinophils Absolute: 0.2 10*3/uL (ref 0.0–0.5)
Eosinophils Relative: 2 %
HCT: 39.3 % (ref 36.0–46.0)
Hemoglobin: 13.1 g/dL (ref 12.0–15.0)
Immature Granulocytes: 1 %
Lymphocytes Relative: 13 %
Lymphs Abs: 1.2 10*3/uL (ref 0.7–4.0)
MCH: 31.1 pg (ref 26.0–34.0)
MCHC: 33.3 g/dL (ref 30.0–36.0)
MCV: 93.3 fL (ref 80.0–100.0)
Monocytes Absolute: 0.5 10*3/uL (ref 0.1–1.0)
Monocytes Relative: 6 %
Neutro Abs: 7.5 10*3/uL (ref 1.7–7.7)
Neutrophils Relative %: 78 %
Platelets: 214 10*3/uL (ref 150–400)
RBC: 4.21 MIL/uL (ref 3.87–5.11)
RDW: 12.6 % (ref 11.5–15.5)
WBC: 9.6 10*3/uL (ref 4.0–10.5)
nRBC: 0 % (ref 0.0–0.2)

## 2021-05-20 LAB — COMPREHENSIVE METABOLIC PANEL
ALT: 175 U/L — ABNORMAL HIGH (ref 0–44)
AST: 89 U/L — ABNORMAL HIGH (ref 15–41)
Albumin: 3 g/dL — ABNORMAL LOW (ref 3.5–5.0)
Alkaline Phosphatase: 229 U/L — ABNORMAL HIGH (ref 38–126)
Anion gap: 8 (ref 5–15)
BUN: 10 mg/dL (ref 8–23)
CO2: 27 mmol/L (ref 22–32)
Calcium: 8.9 mg/dL (ref 8.9–10.3)
Chloride: 106 mmol/L (ref 98–111)
Creatinine, Ser: 0.82 mg/dL (ref 0.44–1.00)
GFR, Estimated: >60 mL/min (ref >60)
Glucose, Bld: 130 mg/dL — ABNORMAL HIGH (ref 70–99)
Potassium: 3.9 mmol/L (ref 3.5–5.1)
Sodium: 141 mmol/L (ref 135–145)
Total Bilirubin: 2 mg/dL — ABNORMAL HIGH (ref 0.3–1.2)
Total Protein: 6.3 g/dL — ABNORMAL LOW (ref 6.5–8.1)

## 2021-05-20 LAB — MAGNESIUM: Magnesium: 2.2 mg/dL (ref 1.7–2.4)

## 2021-05-20 LAB — PHOSPHORUS: Phosphorus: 3.1 mg/dL (ref 2.5–4.6)

## 2021-05-20 MED ORDER — ACETAMINOPHEN 325 MG PO TABS: 650.0000 mg | ORAL_TABLET | Freq: Four times a day (QID) | ORAL | 0 refills | Status: AC | PRN

## 2021-05-20 MED ORDER — HYDROCODONE-ACETAMINOPHEN 5-325 MG PO TABS: 1.0000 | ORAL_TABLET | Freq: Four times a day (QID) | ORAL | 0 refills | Status: DC | PRN

## 2021-05-20 MED ORDER — METHOCARBAMOL 500 MG PO TABS: 500.0000 mg | ORAL_TABLET | Freq: Three times a day (TID) | ORAL | 0 refills | Status: DC | PRN

## 2021-05-20 NOTE — Progress Notes (Signed)
Assessment unchanged. Pt verbalized understanding of dc instructions through teach back. Discharged to front entrance accompanied by husband and NT.

## 2021-05-20 NOTE — Progress Notes (Signed)
1 Day Post-Op   Subjective/Chief Complaint: No complaints. Tolerating diet   Objective: Vital signs in last 24 hours: Temp:  [97.6 F (36.4 C)-98.6 F (37 C)] 97.8 F (36.6 C) (06/04 0539) Pulse Rate:  [72-93] 72 (06/04 0539) Resp:  [11-24] 20 (06/04 0539) BP: (141-178)/(71-90) 146/77 (06/04 0539) SpO2:  [91 %-98 %] 97 % (06/04 0539) Weight:  [102.1 kg] 102.1 kg (06/03 1034) Last BM Date: 05/15/21  Intake/Output from previous day: 06/03 0701 - 06/04 0700 In: 1509.1 [P.O.:360; I.V.:967.4; IV Piggyback:181.7] Out: 2925 [Urine:2925] Intake/Output this shift: No intake/output data recorded.  General appearance: alert and cooperative Resp: clear to auscultation bilaterally Cardio: regular rate and rhythm GI: soft, appropriately tender  Lab Results:  Recent Labs    05/19/21 0401 05/20/21 0418  WBC 8.5 9.6  HGB 12.8 13.1  HCT 39.0 39.3  PLT 204 214   BMET Recent Labs    05/19/21 0401 05/20/21 0418  NA 138 141  K 4.0 3.9  CL 106 106  CO2 25 27  GLUCOSE 122* 130*  BUN 8 10  CREATININE 0.92 0.82  CALCIUM 8.5* 8.9   PT/INR No results for input(s): LABPROT, INR in the last 72 hours. ABG No results for input(s): PHART, HCO3 in the last 72 hours.  Invalid input(s): PCO2, PO2  Studies/Results: DG ERCP  Result Date: 05/19/2021 CLINICAL DATA:  Right upper quadrant pain, cholelithiasis, concern for choledocholithiasis EXAM: ERCP with balloon sweep TECHNIQUE: Multiple spot images obtained with the fluoroscopic device and submitted for interpretation post-procedure. FLUOROSCOPY TIME:  Fluoroscopy Time:  1 minutes 37 seconds Radiation Exposure Index (if provided by the fluoroscopic device): 28 mGy Number of Acquired Spot Images: 3 COMPARISON:  05/16/2021 FINDINGS: Biliary confluence, common hepatic duct, common bile duct appear patent. No focal occlusion, stricture, or large filling defect. Balloon sweep performed. Cholecystectomy noted. IMPRESSION: Patent biliary tree.  These images were submitted for radiologic interpretation only. Please see the procedural report for the amount of contrast and the fluoroscopy time utilized. Electronically Signed   By: Jerilynn Mages.  Shick M.D.   On: 05/19/2021 12:32    Anti-infectives: Anti-infectives (From admission, onward)   Start     Dose/Rate Route Frequency Ordered Stop   05/16/21 2200  cefTRIAXone (ROCEPHIN) 2 g in sodium chloride 0.9 % 100 mL IVPB        2 g 200 mL/hr over 30 Minutes Intravenous Every 24 hours 05/16/21 1527     05/16/21 2200  metroNIDAZOLE (FLAGYL) IVPB 500 mg        500 mg 100 mL/hr over 60 Minutes Intravenous Every 8 hours 05/16/21 1527     05/15/21 2300  piperacillin-tazobactam (ZOSYN) IVPB 3.375 g  Status:  Discontinued        3.375 g 12.5 mL/hr over 240 Minutes Intravenous Every 8 hours 05/15/21 2205 05/16/21 1527      Assessment/Plan: s/p Procedure(s): ENDOSCOPIC RETROGRADE CHOLANGIOPANCREATOGRAPHY (ERCP) (N/A) SPHINCTEROTOMY Advance diet Discharge  LOS: 5 days    Autumn Messing III 05/20/2021

## 2021-05-20 NOTE — Discharge Summary (Signed)
Physician Discharge Summary  Samantha Roberson NKN:[id number] DOB: [birth year] DOA: 05/15/2021  PCP: Glendon Axe, MD  Admit date: 05/15/2021 Discharge date: 05/20/2021  Admitted From: Home Disposition: Home   Recommendations for Outpatient Follow-up:  1. Follow up with PCP in 1-2 weeks 2. Follow up with General Surgery within 1-2 weeks 3. Follow up with Gastroenterology within 1-2 weeks  4. Please obtain BMP/CBC in one week 5. Please follow up on the following pending results:  Home Health: No Equipment/Devices: None   Discharge Condition: Stable  CODE STATUS: FULL CODE Diet recommendation: Soft Heart Healthy.   Brief/Interim Summary: The patient is a 60 year old obese Caucasian female with a past medical history significant for but not limited to asthma, history of status post right nephrectomy as well as other comorbidities who presented with abdominal pain and was worked up and found to have suspected acute calculus cholecystitis and elevated LFTs.  GI and general surgery were both consulted and MRCP was done which showed a tiny gallstone near the gallbladder fundus with no biliary ductal dilatation and no filling defect to the ampulla.  She underwent a laparoscopic cholecystectomy by general surgery Dr. Marcello Moores on 05/17/2021 however had no IOC.  Yesterday her LFTs and bilirubin had elevated significantly so GI was contacted and patient underwent an ERCP this AM which showed that the major papilla appeared normal and the common bile duct was mildly dilated of uncertain etiology but the patient had a cholecystectomy.  Biliary sphincterotomy was performed and the biliary tree was swept and nothing was found.  Her LFTs and her bilirubin started trending down and she is deemed medically stable.  Diet was advanced and she tolerated without issues.  She will need to follow-up with her PCP, and general surgeon in the next few weeks.  Discharge Diagnoses:  Principal Problem:   Acute calculous  cholecystitis Active Problems:   Elevated LFTs   Essential hypertension  Acute Calculus Cholecystitis status post laparoscopic cholecystectomy postoperative day 3 -Presented with abdominal pain -General surgery was consulted and plans for laparoscopic cholecystectomy today with IOC later -Continue supportive care with antiemetics -She is currently n.p.o. and will defer diet advancement to general surgery and gastroenterology -Continue with pain control acetaminophen 650 mg p.o. every 6 as needed/RC, hydrocodone-acetaminophen 1-2 tabs p.o. every 4 as needed for moderate pain, and IV fentanyl 12.5 to 50 mcg every 2 as needed severe pain; will discharge on p.o. hydrocodone acetaminophen and Robaxin if necessary -Continue with antibiotic coverage with IV ceftriaxone/Flagyl for now -Continue with pantoprazole 40 mg p.o. twice daily for GERD and changed to p.o. daily at discharge -IV fluid hydration is now stopped  Abnormal LFT's/Elevated LFT's, improving  -Initially was trending down now trended up in the setting of cholecystectomy -MRCP done and showed a tiny gallstone near the gallbladder fundus with no biliary ductal dilatation or filling defect to the ampulla -She underwent surgical intervention yesterday and AST at that time was 44 and trended up to 315 and today it is 89 at the time of discharge ALT went from 66 and trended up to 254 yesterday and today it is 179 at the time of discharge -Has been consulted and planning for ERCP in the a.m. as no IOC was done with surgery yesterday -Repeat CMP in a.m.  Hyperbilirubinemia -T bili had been trending down and went from 1.6 down to 1.2 then trended up to 3.9 and today it is 2.0 -Could be reactive in the setting of her cholecystectomy however GIs been consulted  performed an EGD this morning which showed that the major papilla appeared normal and that the common bile duct was mildly dilated of unclear etiology.  A bilious enterotomy was performed  and the biliary tree was swept and t nothing was found -Continue monitor and trend and repeat CMP in a.m.  Leukocytosis, improved -Patient's T bili went from 6.5 is now 13.7 in the setting of her surgical intervention and is improved back to 9.6 and normal -Continue monitor trend and repeat CBC in the a.m.  GERD -C/w Pantoprazole 40 mg po BID  Asthma  -Allergy Induced -Stable -Continue to Monitor as she is not in Exacerbation  S/p Right Nephrectomy -BUN/Cr is relatively stable and gone from 16/1.07 -> 14/0.91 -> 10/1.02 -> 8/0.86 -> 8/0.92 -> 10/0.82 -Avoid further nephrotoxic medications, contrast dyes, hypotension and renally dose medications Can repeat CMP in a.m.  Small Nodular Densisties  -Noted along the Right Psoas Muscle and was seen on prior CT -Continue to Follow as an outpatient and have attention on follow up as an outpatient  Obesity -Complicates overall prognosis and care -Estimated body mass index is 33.23 kg/m as calculated from the following:   Height as of this encounter: 5\' 9"  (1.753 m).   Weight as of this encounter: 102.1 kg. -Weight Loss and Dietary Counseling given    Discharge Instructions  Discharge Instructions    Call MD for:  difficulty breathing, headache or visual disturbances   Complete by: As directed    Call MD for:  extreme fatigue   Complete by: As directed    Call MD for:  hives   Complete by: As directed    Call MD for:  persistant dizziness or light-headedness   Complete by: As directed    Call MD for:  persistant nausea and vomiting   Complete by: As directed    Call MD for:  redness, tenderness, or signs of infection (pain, swelling, redness, odor or green/yellow discharge around incision site)   Complete by: As directed    Call MD for:  severe uncontrolled pain   Complete by: As directed    Call MD for:  temperature >100.4   Complete by: As directed    Diet - low sodium heart healthy   Complete by: As directed     Discharge instructions   Complete by: As directed    You were cared for by a hospitalist during your hospital stay. If you have any questions about your discharge medications or the care you received while you were in the hospital after you are discharged, you can call the unit and ask to speak with the hospitalist on call if the hospitalist that took care of you is not available. Once you are discharged, your primary care physician will handle any further medical issues. Please note that NO REFILLS for any discharge medications will be authorized once you are discharged, as it is imperative that you return to your primary care physician (or establish a relationship with a primary care physician if you do not have one) for your aftercare needs so that they can reassess your need for medications and monitor your lab values.  Follow up with PCP and General Surgery within 1 week. Take all medications as prescribed. If symptoms change or worsen please return to the ED for evaluation   Increase activity slowly   Complete by: As directed      Allergies as of 05/20/2021      Reactions   Benadryl [diphenhydramine Hcl]  Anaphylaxis   Grass Extracts [gramineae Pollens] Anaphylaxis   Freshly cut grass closes her throat off   Other Anaphylaxis   "clorox/ bleach "       Medication List    STOP taking these medications   oxyCODONE 5 MG immediate release tablet Commonly known as: Oxy IR/ROXICODONE     TAKE these medications   acetaminophen 325 MG tablet Commonly known as: TYLENOL Take 2 tablets (650 mg total) by mouth every 6 (six) hours as needed for mild pain (or Fever >/= 101).   aspirin 81 MG tablet Take 81 mg by mouth daily.   cholecalciferol 25 MCG (1000 UNIT) tablet Commonly known as: VITAMIN D3 Take 1,000 Units by mouth daily.   furosemide 20 MG tablet Commonly known as: LASIX Take 20 mg by mouth daily as needed for fluid.   HYDROcodone-acetaminophen 5-325 MG tablet Commonly known as:  NORCO/VICODIN Take 1 tablet by mouth every 6 (six) hours as needed for moderate pain.   methocarbamol 500 MG tablet Commonly known as: ROBAXIN Take 1 tablet (500 mg total) by mouth every 8 (eight) hours as needed for muscle spasms.   metoprolol succinate 25 MG 24 hr tablet Commonly known as: TOPROL-XL Take 25 mg by mouth daily.   multivitamin capsule Take 1 capsule by mouth daily.   pantoprazole 40 MG tablet Commonly known as: PROTONIX Take 1 tablet (40 mg total) by mouth daily.       Follow-up Information    Leighton Ruff, MD. Go on 7/84/6962.   Specialties: General Surgery, Colon and Rectal Surgery Why: 10:50am, please arrive at 10:20am to fill out paperwork Contact information: [address] [place] [address] [phone]              Allergies  Allergen Reactions  . Benadryl [Diphenhydramine Hcl] Anaphylaxis  . Grass Extracts [Gramineae Pollens] Anaphylaxis    Freshly cut grass closes her throat off  . Other Anaphylaxis    "clorox/ bleach "    Consultations:  General Surgery  Gastroenterology  Procedures/Studies: CT ABDOMEN PELVIS WO CONTRAST  Result Date: 05/15/2021 CLINICAL DATA:  59 year old female with epigastric pain EXAM: CT ABDOMEN AND PELVIS WITHOUT CONTRAST TECHNIQUE: Multidetector CT imaging of the abdomen and pelvis was performed following the standard protocol without IV contrast. COMPARISON:  Head CT abdomen pelvis dated 10/18/2020. FINDINGS: Evaluation of this exam is limited in the absence of intravenous contrast. Lower chest: The visualized lung bases are clear. No intra-abdominal free air or free fluid. Hepatobiliary: Small subcapsular hypodense lesion along the inferior right lobe of the liver, indeterminate but similar to prior CT. No intrahepatic biliary dilatation. There is a small gallstone. No pericholecystic fluid or evidence of acute cholecystitis by CT. Pancreas: Unremarkable. No pancreatic ductal dilatation or  surrounding inflammatory changes. Spleen: Normal in size without focal abnormality. Adrenals/Urinary Tract: The adrenal glands are unremarkable. Status post prior right nephrectomy. No abnormal soft tissue noted in the nephrectomy bed. There is no hydronephrosis or nephrolithiasis on the left. The left ureter and urinary bladder appear unremarkable. Stomach/Bowel: There is sigmoid diverticulosis and scattered colonic diverticula without active inflammatory changes. There is no bowel obstruction or active inflammation. The appendix is normal. Vascular/Lymphatic: The abdominal aorta and IVC are unremarkable. No portal venous gas. There is no adenopathy. Similar appearance of small nodular density along the right psoas muscle as seen on the prior CT. Reproductive: Hysterectomy. No adnexal masses. Other: Postsurgical changes and scarring of the soft tissues the right lower anterior  abdominal wall. No fluid collection. Musculoskeletal: Mild degenerative changes. No acute osseous pathology. IMPRESSION: 1. No acute intra-abdominal or pelvic pathology. 2. Colonic diverticulosis. No bowel obstruction. Normal appendix. 3. Cholelithiasis. 4. Status post prior right nephrectomy. No abnormal soft tissue in the nephrectomy bed. 5. Stable small nodular densities along the right psoas muscle as seen on the prior CT. Attention on follow-up imaging recommended. Electronically Signed   By: Anner Crete M.D.   On: 05/15/2021 15:10   MR 3D Recon At Scanner  Result Date: 05/16/2021 CLINICAL DATA:  Right upper quadrant pain, abnormal ultrasound equivocal for acute cholecystitis, cholelithiasis, possible choledocholithiasis EXAM: MRI ABDOMEN WITHOUT AND WITH CONTRAST (INCLUDING MRCP) TECHNIQUE: Multiplanar multisequence MR imaging of the abdomen was performed both before and after the administration of intravenous contrast. Heavily T2-weighted images of the biliary and pancreatic ducts were obtained, and three-dimensional MRCP  images were rendered by post processing. CONTRAST:  88mL GADAVIST GADOBUTROL 1 MMOL/ML IV SOLN COMPARISON:  Right upper quadrant ultrasound, 05/15/2021, CT abdomen pelvis, 05/15/2021 FINDINGS: Lower chest: No acute findings. Hepatobiliary: Simple, nonenhancing cyst of the inferior right lobe of the liver, hepatic segment VI (series 14, image 20). No mass or other parenchymal abnormality identified. Mildly distended gallbladder. Tiny gallstone near the gallbladder fundus (series 14, image 14). No gallbladder wall thickening. No biliary ductal dilatation or filling defect to the ampulla. Pancreas: No mass, inflammatory changes, or other parenchymal abnormality identified. No pancreatic ductal dilatation. Spleen:  Within normal limits in size and appearance. Adrenals/Urinary Tract: No masses identified. Status post right nephrectomy. No evidence of hydronephrosis. Stomach/Bowel: Visualized portions within the abdomen are unremarkable. Vascular/Lymphatic: No pathologically enlarged lymph nodes identified. No abdominal aortic aneurysm demonstrated. Other:  None. Musculoskeletal: No suspicious bone lesions identified. IMPRESSION: 1. Tiny gallstone near the gallbladder fundus. No gallbladder wall thickening or pericholecystic fluid. 2. No biliary ductal dilatation or filling defect to the ampulla. 3. Status post right nephrectomy. Electronically Signed   By: Eddie Candle M.D.   On: 05/16/2021 14:44   DG Chest Portable 1 View  Result Date: 05/15/2021 CLINICAL DATA:  Chest pain EXAM: PORTABLE CHEST 1 VIEW COMPARISON:  10/20/2020 FINDINGS: Cardiomegaly. Both lungs are clear. The visualized skeletal structures are unremarkable. IMPRESSION: Cardiomegaly without acute abnormality of the lungs in AP portable projection. Electronically Signed   By: Eddie Candle M.D.   On: 05/15/2021 15:01   DG ERCP  Result Date: 05/19/2021 CLINICAL DATA:  Right upper quadrant pain, cholelithiasis, concern for choledocholithiasis EXAM: ERCP  with balloon sweep TECHNIQUE: Multiple spot images obtained with the fluoroscopic device and submitted for interpretation post-procedure. FLUOROSCOPY TIME:  Fluoroscopy Time:  1 minutes 37 seconds Radiation Exposure Index (if provided by the fluoroscopic device): 28 mGy Number of Acquired Spot Images: 3 COMPARISON:  05/16/2021 FINDINGS: Biliary confluence, common hepatic duct, common bile duct appear patent. No focal occlusion, stricture, or large filling defect. Balloon sweep performed. Cholecystectomy noted. IMPRESSION: Patent biliary tree. These images were submitted for radiologic interpretation only. Please see the procedural report for the amount of contrast and the fluoroscopy time utilized. Electronically Signed   By: Jerilynn Mages.  Shick M.D.   On: 05/19/2021 12:32   MR ABDOMEN MRCP W WO CONTAST  Result Date: 05/16/2021 CLINICAL DATA:  Right upper quadrant pain, abnormal ultrasound equivocal for acute cholecystitis, cholelithiasis, possible choledocholithiasis EXAM: MRI ABDOMEN WITHOUT AND WITH CONTRAST (INCLUDING MRCP) TECHNIQUE: Multiplanar multisequence MR imaging of the abdomen was performed both before and after the administration of intravenous contrast. Heavily T2-weighted images  of the biliary and pancreatic ducts were obtained, and three-dimensional MRCP images were rendered by post processing. CONTRAST:  50mL GADAVIST GADOBUTROL 1 MMOL/ML IV SOLN COMPARISON:  Right upper quadrant ultrasound, 05/15/2021, CT abdomen pelvis, 05/15/2021 FINDINGS: Lower chest: No acute findings. Hepatobiliary: Simple, nonenhancing cyst of the inferior right lobe of the liver, hepatic segment VI (series 14, image 20). No mass or other parenchymal abnormality identified. Mildly distended gallbladder. Tiny gallstone near the gallbladder fundus (series 14, image 14). No gallbladder wall thickening. No biliary ductal dilatation or filling defect to the ampulla. Pancreas: No mass, inflammatory changes, or other parenchymal  abnormality identified. No pancreatic ductal dilatation. Spleen:  Within normal limits in size and appearance. Adrenals/Urinary Tract: No masses identified. Status post right nephrectomy. No evidence of hydronephrosis. Stomach/Bowel: Visualized portions within the abdomen are unremarkable. Vascular/Lymphatic: No pathologically enlarged lymph nodes identified. No abdominal aortic aneurysm demonstrated. Other:  None. Musculoskeletal: No suspicious bone lesions identified. IMPRESSION: 1. Tiny gallstone near the gallbladder fundus. No gallbladder wall thickening or pericholecystic fluid. 2. No biliary ductal dilatation or filling defect to the ampulla. 3. Status post right nephrectomy. Electronically Signed   By: Eddie Candle M.D.   On: 05/16/2021 14:44   ECHOCARDIOGRAM COMPLETE  Result Date: 05/16/2021    ECHOCARDIOGRAM REPORT   Patient Name:   SHIFRA SWARTZENTRUBER Date of Exam: 05/16/2021 Medical Rec #:  [id number]          Height:       69.0 in Accession #:    [id number]         Weight:       225.0 lb Date of Birth:  10/14/1961         BSA:          2.172 m Patient Age:    84 years           BP:           132/88 mmHg Patient Gender: F                  HR:           61 bpm. Exam Location:  Inpatient Procedure: 2D Echo, Cardiac Doppler and Color Doppler Indications:    Cardiomegaly I51.7  History:        Patient has no prior history of Echocardiogram examinations.                 Risk Factors:Hypertension.  Sonographer:    Bernadene Person RDCS Referring Phys: [place]  1. Left ventricular ejection fraction, by estimation, is 60 to 65%. The left ventricle has normal function. The left ventricle has no regional wall motion abnormalities. There is mild left ventricular hypertrophy. Left ventricular diastolic parameters were normal.  2. Right ventricular systolic function is normal. The right ventricular size is normal. There is normal pulmonary artery systolic pressure. The estimated right  ventricular systolic pressure is 72.5 mmHg.  3. The mitral valve is normal in structure. Trivial mitral valve regurgitation.  4. The aortic valve is tricuspid. Aortic valve regurgitation is not visualized. No aortic stenosis is present.  5. The inferior vena cava is normal in size with greater than 50% respiratory variability, suggesting right atrial pressure of 3 mmHg. FINDINGS  Left Ventricle: Left ventricular ejection fraction, by estimation, is 60 to 65%. The left ventricle has normal function. The left ventricle has no regional wall motion abnormalities. The left ventricular internal cavity size was small. There is mild left ventricular  hypertrophy. Left ventricular diastolic parameters were normal. Right Ventricle: The right ventricular size is normal. No increase in right ventricular wall thickness. Right ventricular systolic function is normal. There is normal pulmonary artery systolic pressure. The tricuspid regurgitant velocity is 2.32 m/s, and  with an assumed right atrial pressure of 3 mmHg, the estimated right ventricular systolic pressure is 41.6 mmHg. Left Atrium: Left atrial size was normal in size. Right Atrium: Right atrial size was normal in size. Pericardium: There is no evidence of pericardial effusion. Mitral Valve: The mitral valve is normal in structure. Trivial mitral valve regurgitation. Tricuspid Valve: The tricuspid valve is normal in structure. Tricuspid valve regurgitation is trivial. Aortic Valve: The aortic valve is tricuspid. Aortic valve regurgitation is not visualized. No aortic stenosis is present. Pulmonic Valve: The pulmonic valve was not well visualized. Pulmonic valve regurgitation is trivial. Aorta: The aortic root and ascending aorta are structurally normal, with no evidence of dilitation. Venous: The inferior vena cava is normal in size with greater than 50% respiratory variability, suggesting right atrial pressure of 3 mmHg. IAS/Shunts: The interatrial septum was not well  visualized.  LEFT VENTRICLE PLAX 2D LVIDd:         3.60 cm  Diastology LVIDs:         2.30 cm  LV e' medial:    8.03 cm/s LV PW:         2.15 cm  LV E/e' medial:  10.0 LV IVS:        0.90 cm  LV e' lateral:   10.80 cm/s LVOT diam:     2.10 cm  LV E/e' lateral: 7.5 LV SV:         67 LV SV Index:   31 LVOT Area:     3.46 cm  RIGHT VENTRICLE RV S prime:     9.45 cm/s TAPSE (M-mode): 2.4 cm LEFT ATRIUM             Index       RIGHT ATRIUM           Index LA diam:        2.70 cm 1.24 cm/m  RA Area:     16.40 cm LA Vol (A2C):   57.5 ml 26.47 ml/m RA Volume:   46.80 ml  21.55 ml/m LA Vol (A4C):   55.3 ml 25.46 ml/m LA Biplane Vol: 57.9 ml 26.66 ml/m  AORTIC VALVE LVOT Vmax:   87.80 cm/s LVOT Vmean:  58.300 cm/s LVOT VTI:    0.194 m  AORTA Ao Root diam: 3.30 cm MITRAL VALVE               TRICUSPID VALVE MV Area (PHT): 4.06 cm    TR Peak grad:   21.5 mmHg MV Decel Time: 187 msec    TR Vmax:        232.00 cm/s MV E velocity: 80.60 cm/s MV A velocity: 68.60 cm/s  SHUNTS MV E/A ratio:  1.17        Systemic VTI:  0.19 m                            Systemic Diam: 2.10 cm Oswaldo Milian MD Electronically signed by Oswaldo Milian MD Signature Date/Time: 05/16/2021/1:53:51 PM    Final    US Abdomen Limited RUQ (LIVER/GB)  Result Date: 05/15/2021 CLINICAL DATA:  Right upper quadrant pain for 2 weeks. EXAM: ULTRASOUND ABDOMEN LIMITED RIGHT UPPER QUADRANT COMPARISON:  Body CT from the same day. FINDINGS: Gallbladder: Gallbladder calculi the largest measuring 7.5 mm. Gallbladder wall measures 2.6 cm, upper limits of normal. Irregular focal gallbladder wall thickening is noted near the neck of the gallbladder. Positive sonographic Murphy sign noted by sonographer. Common bile duct: Diameter: 7.5 mm. Tiny echogenic focus was seen within the extrahepatic common bile duct, query choledocholithiasis. Liver: No focal lesion identified. Within normal limits in parenchymal echogenicity. Portal vein is patent on color  Doppler imaging with normal direction of blood flow towards the liver. Other: None. IMPRESSION: Cholelithiasis.  Equivocal findings for acute cholecystitis. Focal irregular gallbladder wall thickening near the neck of the gallbladder. This could be further evaluated with nonemergent contrast enhanced MRI of the abdomen to exclude infiltrative lesion. Mildly enlarged common bile duct with questionable choledocholithiasis. Electronically Signed   By: Fidela Salisbury M.D.   On: 05/15/2021 17:19   ERCP Findings: The major papilla was normal. The bile duct was deeply cannulated with  the short-nosed traction sphincterotome. Contrast was injected. I  personally interpreted the bile duct images. There was brisk flow of  contrast through the ducts. Image quality was excellent. Contrast  extended to the hepatic ducts. The common bile duct was mildly dilated  and diffusely dilated, uncertain etiology. The largest diameter was 9  mm. A cholecystectomy had been performed. A short 0.035 inch Soft  Jagwire was passed into the biliary tree. A 10 mm biliary sphincterotomy  was made with a monofilament traction (standard) sphincterotome using  ERBE electrocautery. There was no post-sphincterotomy bleeding. The  biliary tree was swept with a 12 mm balloon starting at the bifurcation.  Nothing was found. The CBD was easily cannulated on the first attempt using a full bow of  the sphincterotome. The guidewire was secured in the right intrahepatic  ducts. Contrast injection showed a mildly dilated CBD and normal  intrahepatic ducts. There was no evidence of any cystic duct leak or  leak from the accessory ducts. The CBD was swept three times after the  sphinctertomy. There was no evidence of any stones or sludge. The final  occlusion cholangiogram was negative for any retained stones. Impression: - The  major papilla appeared normal. - The common bile duct was mildly dilated,  uncertain etiology. - The patient has had a cholecystectomy. - A biliary sphincterotomy was performed. - The biliary tree was swept and nothing was found.   Subjective: Seen and examined at bedside and she was doing well.  She denied any complaints but did have some mild abdominal discomfort as tolerable.  No chest pain or shortness breath.  No other concerns or complaints at this time is stable to be discharged and go home and follow-up with PCP and general surgeon outpatient setting.  Discharge Exam: Vitals:   05/19/21 2045 05/20/21 0539  BP: (!) 141/71 (!) 146/77  Pulse: 73 72  Resp: 20 20  Temp: 98 F (36.7 C) 97.8 F (36.6 C)  SpO2: 93% 97%   Vitals:   05/19/21 1210 05/19/21 1253 05/19/21 2045 05/20/21 0539  BP: (!) 167/90 (!) 154/90 (!) 141/71 (!) 146/77  Pulse: 81 79 73 72  Resp: 13 16 20 20   Temp:  97.6 F (36.4 C) 98 F (36.7 C) 97.8 F (36.6 C)  TempSrc:  Oral Oral Oral  SpO2: 98% 97% 93% 97%  Weight:      Height:       General: Pt is alert, awake, not in acute distress Cardiovascular: RRR, S1/S2 +, no  rubs, no gallops Respiratory: Diminished bilaterally with coarse breath sounds, no wheezing, no rhonchi Abdominal: Soft, minimally tender, distended secondary to body habitus, bowel sounds + Extremities: Trace edema, no cyanosis  The results of significant diagnostics from this hospitalization (including imaging, microbiology, ancillary and laboratory) are listed below for reference.    Microbiology: Recent Results (from the past 240 hour(s))  Resp Panel by RT-PCR (Flu A&B, Covid) Nasopharyngeal Swab     Status: None   Collection Time: 05/15/21  7:03 PM   Specimen: Nasopharyngeal Swab; Nasopharyngeal(NP) swabs in vial transport medium  Result Value Ref Range  Status   SARS Coronavirus 2 by RT PCR NEGATIVE NEGATIVE Final    Comment: (NOTE) SARS-CoV-2 target nucleic acids are NOT DETECTED.  The SARS-CoV-2 RNA is generally detectable in upper respiratory specimens during the acute phase of infection. The lowest concentration of SARS-CoV-2 viral copies this assay can detect is 138 copies/mL. A negative result does not preclude SARS-Cov-2 infection and should not be used as the sole basis for treatment or other patient management decisions. A negative result may occur with  improper specimen collection/handling, submission of specimen other than nasopharyngeal swab, presence of viral mutation(s) within the areas targeted by this assay, and inadequate number of viral copies(<138 copies/mL). A negative result must be combined with clinical observations, patient history, and epidemiological information. The expected result is Negative.  Fact Sheet for Patients:  EntrepreneurPulse.com.au  Fact Sheet for Healthcare Providers:  IncredibleEmployment.be  This test is no t yet approved or cleared by the [place] FDA and  has been authorized for detection and/or diagnosis of SARS-CoV-2 by FDA under an Emergency Use Authorization (EUA). This EUA will remain  in effect (meaning this test can be used) for the duration of the COVID-19 declaration under Section 564(b)(1) of the Act, 21 U.S.C.section 360bbb-3(b)(1), unless the authorization is terminated  or revoked sooner.       Influenza A by PCR NEGATIVE NEGATIVE Final   Influenza B by PCR NEGATIVE NEGATIVE Final    Comment: (NOTE) The Xpert Xpress SARS-CoV-2/FLU/RSV plus assay is intended as an aid in the diagnosis of influenza from Nasopharyngeal swab specimens and should not be used as a sole basis for treatment. Nasal washings and aspirates are unacceptable for Xpert Xpress SARS-CoV-2/FLU/RSV testing.  Fact Sheet for  Patients: EntrepreneurPulse.com.au  Fact Sheet for Healthcare Providers: IncredibleEmployment.be  This test is not yet approved or cleared by the [place] FDA and has been authorized for detection and/or diagnosis of SARS-CoV-2 by FDA under an Emergency Use Authorization (EUA). This EUA will remain in effect (meaning this test can be used) for the duration of the COVID-19 declaration under Section 564(b)(1) of the Act, 21 U.S.C. section 360bbb-3(b)(1), unless the authorization is terminated or revoked.  Performed at [facility], [address], [place], [place] [address]   Surgical pcr screen     Status: None   Collection Time: 05/16/21  5:47 PM   Specimen: Nasal Mucosa; Nasal Swab  Result Value Ref Range Status   MRSA, PCR NEGATIVE NEGATIVE Final   Staphylococcus aureus NEGATIVE NEGATIVE Final    Comment: (NOTE) The Xpert SA Assay (FDA approved for NASAL specimens in patients 32 years of age and older), is one component of a comprehensive surveillance program. It is not intended to diagnose infection nor to guide or monitor treatment. Performed at [facility], [place] [address], [place], [place] [address]     Labs: BNP (last 3 results)  No results for input(s): BNP in the last 8760 hours. Basic Metabolic Panel: Recent Labs  Lab 05/16/21 0412 05/17/21 0427 05/18/21 0414 05/19/21 0401 05/20/21 0418  NA 141 140 138 138 141  K 3.8 4.4 4.2 4.0 3.9  CL 107 104 100 106 106  CO2 28 31 29 25 27   GLUCOSE 92 123* 133* 122* 130*  BUN 14 10 8 8 10   CREATININE 0.91 1.02* 0.86 0.92 0.82  CALCIUM 9.0 8.9 9.4 8.5* 8.9  MG 2.3  --  2.0  --  2.2  PHOS 4.4  --  2.9  --  3.1   Liver Function Tests: Recent Labs  Lab 05/16/21 0412 05/17/21 0427 05/18/21 0414 05/19/21 0401 05/20/21 0418  AST 48* 44* 315* 166* 89*  ALT 72* 66* 254* 220* 175*  ALKPHOS 148* 140* 232* 230* 229*  BILITOT 1.6* 1.2 3.7* 3.9* 2.0*   PROT 6.3* 6.5 7.2 6.0* 6.3*  ALBUMIN 3.2* 3.3* 3.7 2.9* 3.0*   Recent Labs  Lab 05/15/21 1459  LIPASE 35   No results for input(s): AMMONIA in the last 168 hours. CBC: Recent Labs  Lab 05/15/21 1459 05/16/21 0412 05/17/21 0427 05/18/21 0414 05/19/21 0401 05/20/21 0418  WBC 9.5 7.0 6.5 13.7* 8.5 9.6  NEUTROABS 6.4 3.9  --  12.4*  --  7.5  HGB 13.8 13.1 13.7 14.9 12.8 13.1  HCT 41.2 39.4 41.2 44.9 39.0 39.3  MCV 91.4 92.9 93.4 92.8 94.0 93.3  PLT 242 194 216 236 204 214   Cardiac Enzymes: No results for input(s): CKTOTAL, CKMB, CKMBINDEX, TROPONINI in the last 168 hours. BNP: Invalid input(s): POCBNP CBG: No results for input(s): GLUCAP in the last 168 hours. D-Dimer No results for input(s): DDIMER in the last 72 hours. Hgb A1c No results for input(s): HGBA1C in the last 72 hours. Lipid Profile No results for input(s): CHOL, HDL, LDLCALC, TRIG, CHOLHDL, LDLDIRECT in the last 72 hours. Thyroid function studies No results for input(s): TSH, T4TOTAL, T3FREE, THYROIDAB in the last 72 hours.  Invalid input(s): FREET3 Anemia work up No results for input(s): VITAMINB12, FOLATE, FERRITIN, TIBC, IRON, RETICCTPCT in the last 72 hours. Urinalysis    Component Value Date/Time   COLORURINE YELLOW 05/15/2021 1459   APPEARANCEUR CLEAR 05/15/2021 1459   LABSPEC <1.005 (L) 05/15/2021 1459   PHURINE 6.0 05/15/2021 1459   GLUCOSEU NEGATIVE 05/15/2021 1459   HGBUR NEGATIVE 05/15/2021 1459   BILIRUBINUR NEGATIVE 05/15/2021 1459   KETONESUR NEGATIVE 05/15/2021 1459   PROTEINUR NEGATIVE 05/15/2021 1459   NITRITE NEGATIVE 05/15/2021 1459   LEUKOCYTESUR NEGATIVE 05/15/2021 1459   Sepsis Labs Invalid input(s): PROCALCITONIN,  WBC,  LACTICIDVEN Microbiology Recent Results (from the past 240 hour(s))  Resp Panel by RT-PCR (Flu A&B, Covid) Nasopharyngeal Swab     Status: None   Collection Time: 05/15/21  7:03 PM   Specimen: Nasopharyngeal Swab; Nasopharyngeal(NP) swabs in vial  transport medium  Result Value Ref Range Status   SARS Coronavirus 2 by RT PCR NEGATIVE NEGATIVE Final    Comment: (NOTE) SARS-CoV-2 target nucleic acids are NOT DETECTED.  The SARS-CoV-2 RNA is generally detectable in upper respiratory specimens during the acute phase of infection. The lowest concentration of SARS-CoV-2 viral copies this assay can detect is 138 copies/mL. A negative result does not preclude SARS-Cov-2 infection and should not be used as the sole basis for treatment or other patient management decisions. A negative result may occur with  improper specimen collection/handling, submission of specimen other than nasopharyngeal swab, presence  of viral mutation(s) within the areas targeted by this assay, and inadequate number of viral copies(<138 copies/mL). A negative result must be combined with clinical observations, patient history, and epidemiological information. The expected result is Negative.  Fact Sheet for Patients:  EntrepreneurPulse.com.au  Fact Sheet for Healthcare Providers:  IncredibleEmployment.be  This test is no t yet approved or cleared by the [place] FDA and  has been authorized for detection and/or diagnosis of SARS-CoV-2 by FDA under an Emergency Use Authorization (EUA). This EUA will remain  in effect (meaning this test can be used) for the duration of the COVID-19 declaration under Section 564(b)(1) of the Act, 21 U.S.C.section 360bbb-3(b)(1), unless the authorization is terminated  or revoked sooner.       Influenza A by PCR NEGATIVE NEGATIVE Final   Influenza B by PCR NEGATIVE NEGATIVE Final    Comment: (NOTE) The Xpert Xpress SARS-CoV-2/FLU/RSV plus assay is intended as an aid in the diagnosis of influenza from Nasopharyngeal swab specimens and should not be used as a sole basis for treatment. Nasal washings and aspirates are unacceptable for Xpert Xpress SARS-CoV-2/FLU/RSV testing.  Fact  Sheet for Patients: EntrepreneurPulse.com.au  Fact Sheet for Healthcare Providers: IncredibleEmployment.be  This test is not yet approved or cleared by the [place] FDA and has been authorized for detection and/or diagnosis of SARS-CoV-2 by FDA under an Emergency Use Authorization (EUA). This EUA will remain in effect (meaning this test can be used) for the duration of the COVID-19 declaration under Section 564(b)(1) of the Act, 21 U.S.C. section 360bbb-3(b)(1), unless the authorization is terminated or revoked.  Performed at [facility], [address] [place], [place] [address]   Surgical pcr screen     Status: None   Collection Time: 05/16/21  5:47 PM   Specimen: Nasal Mucosa; Nasal Swab  Result Value Ref Range Status   MRSA, PCR NEGATIVE NEGATIVE Final   Staphylococcus aureus NEGATIVE NEGATIVE Final    Comment: (NOTE) The Xpert SA Assay (FDA approved for NASAL specimens in patients 10 years of age and older), is one component of a comprehensive surveillance program. It is not intended to diagnose infection nor to guide or monitor treatment. Performed at [facility], [place] [address], [place], [place] [address]    Time coordinating discharge: 35 minutes  SIGNED:  Kerney Elbe, DO [facility] 05/20/2021, 6:13 PM Pager is on [place]  If 7PM-7AM, please contact night-coverage [email or web address]

## 2021-05-22 ENCOUNTER — Encounter (HOSPITAL_COMMUNITY): Payer: Self-pay | Admitting: Gastroenterology

## 2021-06-01 ENCOUNTER — Ambulatory Visit (HOSPITAL_COMMUNITY)
Admission: RE | Admit: 2021-06-01 | Discharge: 2021-06-01 | Disposition: A | Discharge status: Home / Self Care | Payer: BC Managed Care – PPO | Source: Ambulatory Visit | Attending: Urology | Admitting: Urology

## 2021-06-01 ENCOUNTER — Other Ambulatory Visit (HOSPITAL_COMMUNITY): Payer: Self-pay | Admitting: Urology

## 2021-06-01 ENCOUNTER — Other Ambulatory Visit: Payer: Self-pay

## 2021-06-01 DIAGNOSIS — Z85528 Personal history of other malignant neoplasm of kidney: Secondary | ICD-10-CM | POA: Insufficient documentation

## 2021-11-30 ENCOUNTER — Other Ambulatory Visit: Payer: Self-pay

## 2021-11-30 ENCOUNTER — Other Ambulatory Visit (HOSPITAL_COMMUNITY): Payer: Self-pay | Admitting: Urology

## 2021-11-30 ENCOUNTER — Ambulatory Visit (HOSPITAL_COMMUNITY)
Admission: RE | Admit: 2021-11-30 | Discharge: 2021-11-30 | Disposition: A | Discharge status: Home / Self Care | Payer: BC Managed Care – PPO | Source: Ambulatory Visit | Attending: Urology | Admitting: Urology

## 2021-11-30 DIAGNOSIS — C641 Malignant neoplasm of right kidney, except renal pelvis: Secondary | ICD-10-CM | POA: Diagnosis not present

## 2021-12-21 IMAGING — CR DG CHEST 2V
2 series · 2 of 2 positions shown · non-contrast
Comparison: 06/01/2021

CLINICAL DATA: History of right renal neoplasm, initial encounter

EXAM:
CHEST - 2 VIEW

[w chest pa]
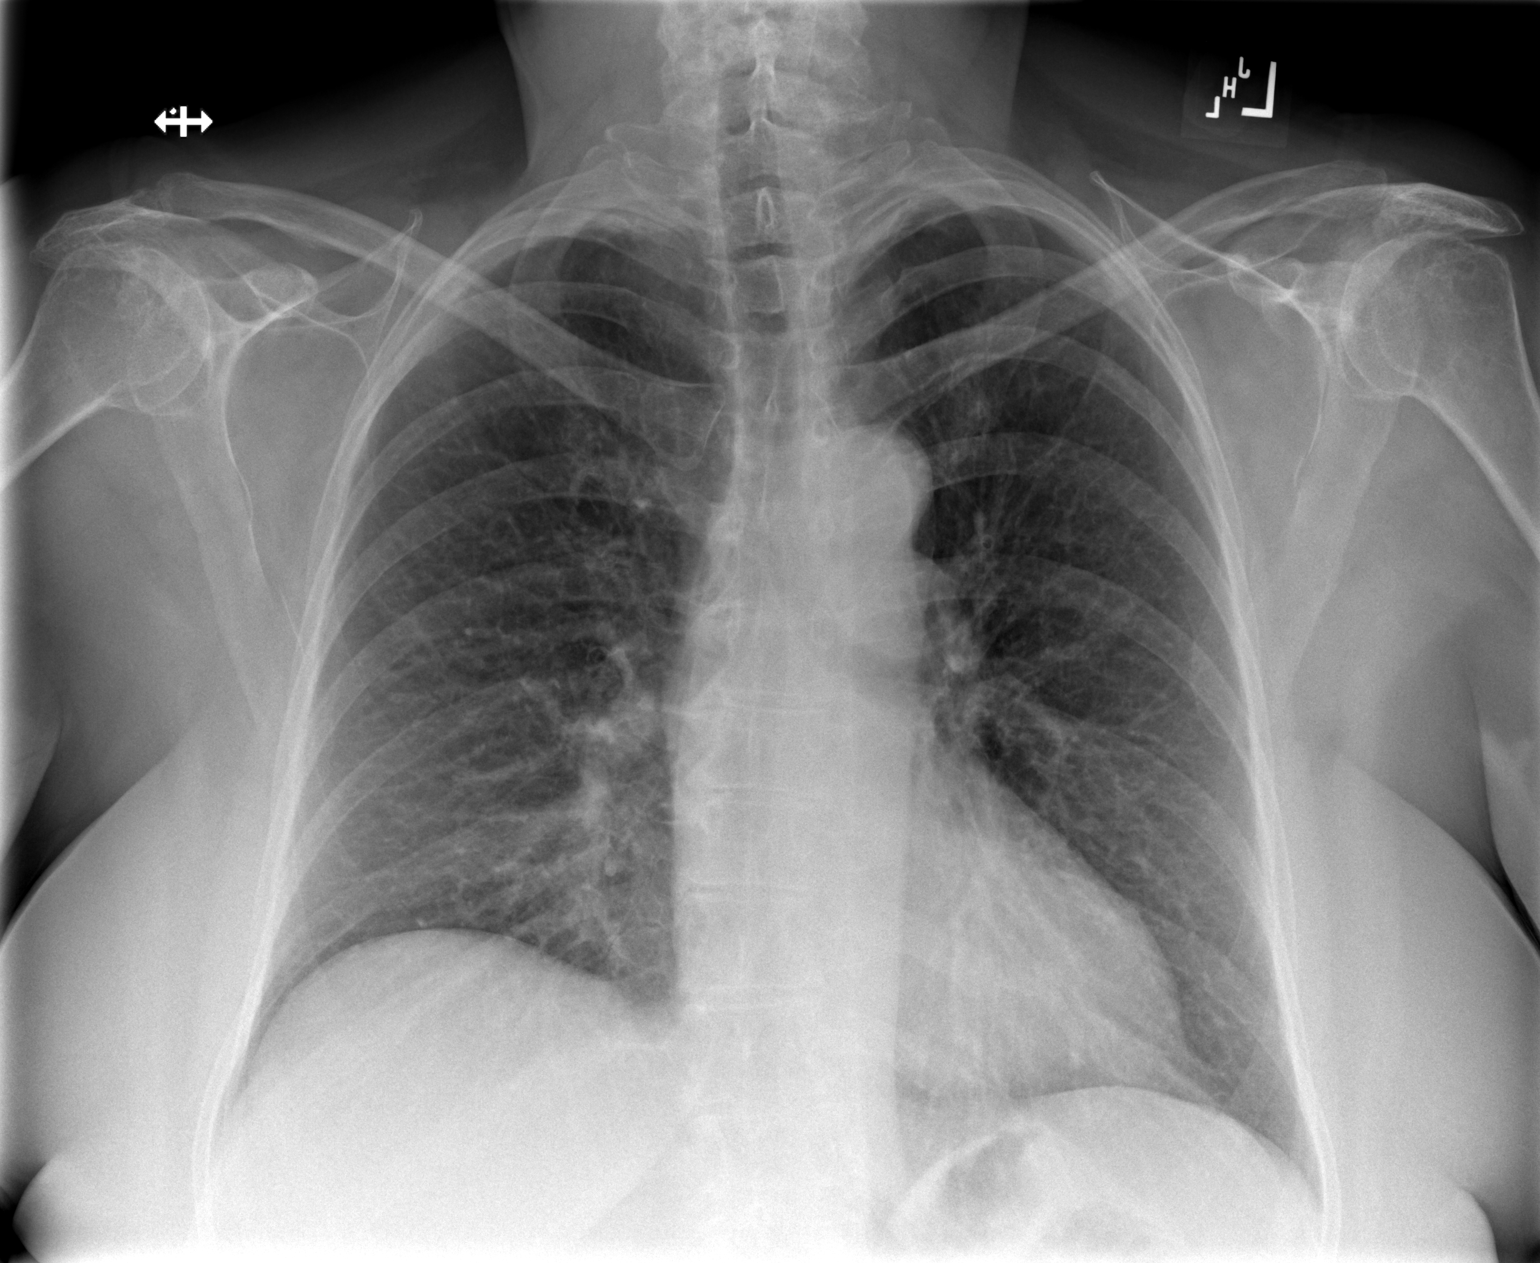

[w chest lat]
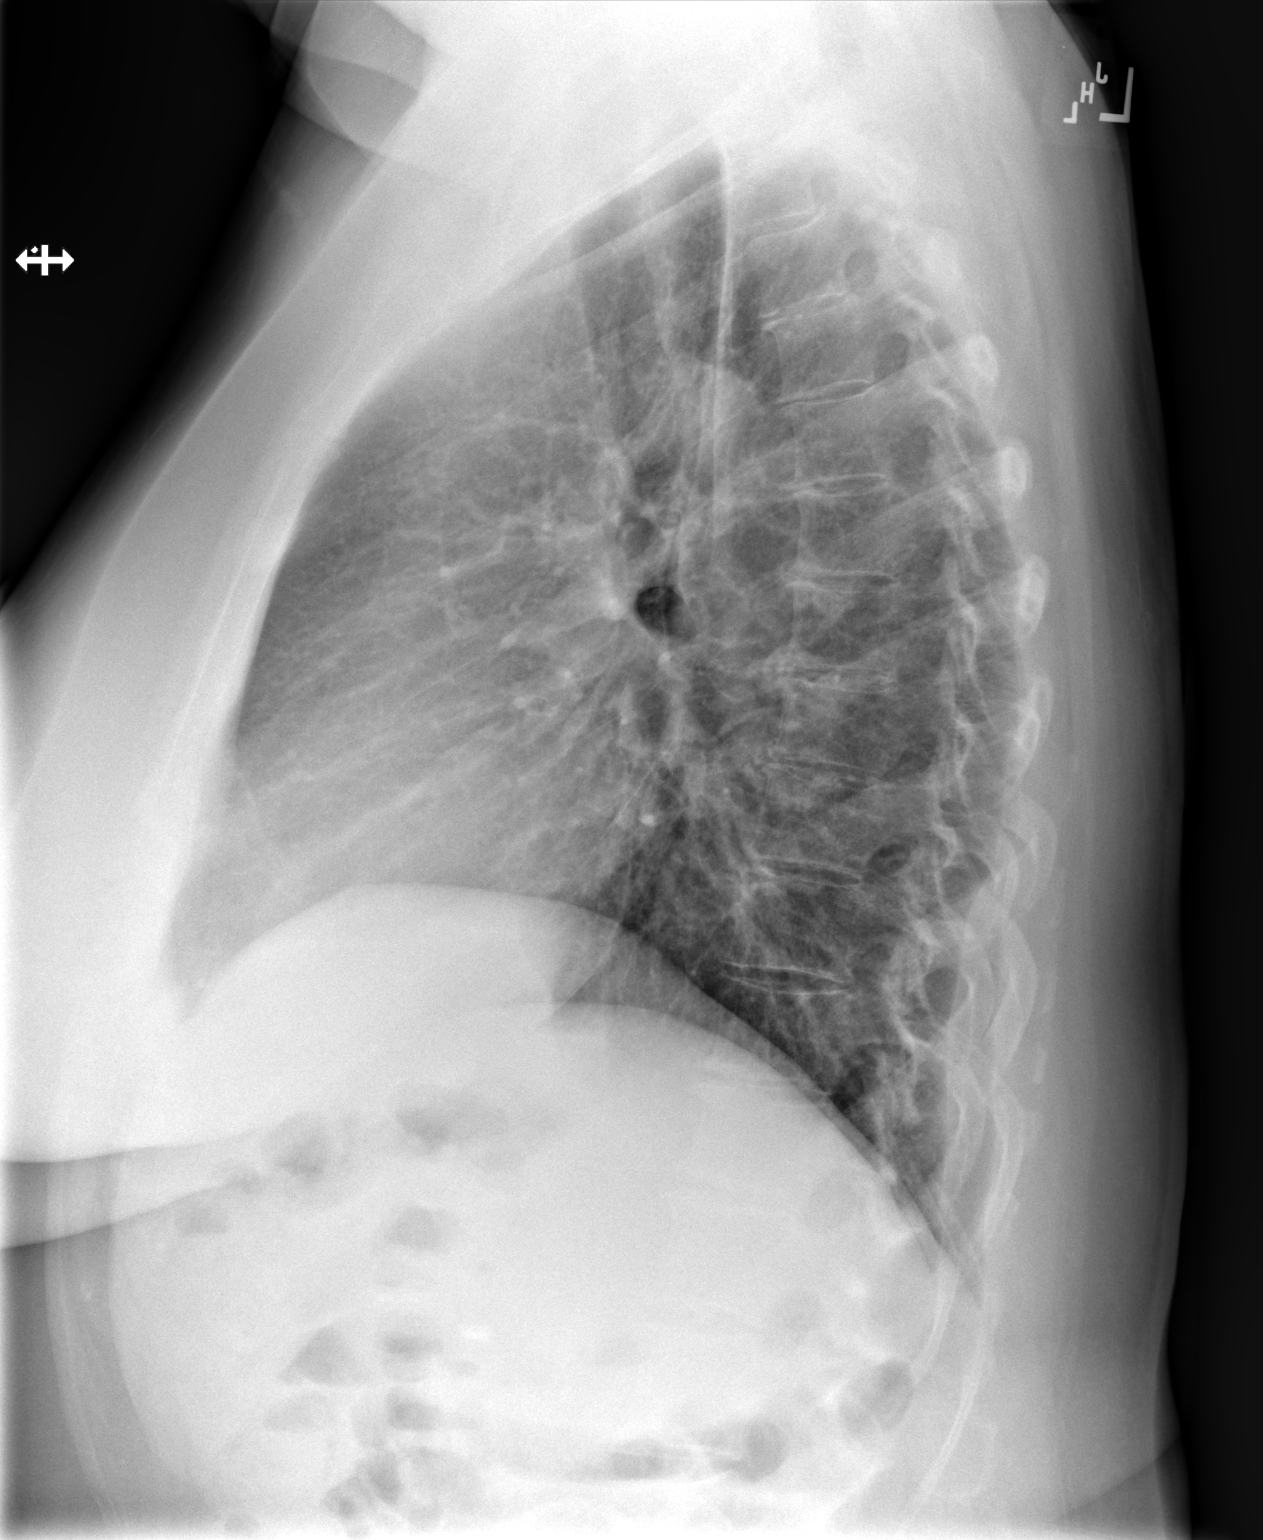

[2 of 2 positions shown; findings below may reference images not displayed]

FINDINGS: Cardiac shadow is within normal limits. Aortic calcifications are
seen. The lungs are clear. No acute bony abnormality is noted.
IMPRESSION: No active cardiopulmonary disease.

## 2022-05-30 ENCOUNTER — Other Ambulatory Visit (HOSPITAL_COMMUNITY): Payer: Self-pay | Admitting: Urology

## 2022-05-30 ENCOUNTER — Ambulatory Visit (HOSPITAL_COMMUNITY)
Admission: RE | Admit: 2022-05-30 | Discharge: 2022-05-30 | Disposition: A | Discharge status: Home / Self Care | Payer: BC Managed Care – PPO | Source: Ambulatory Visit | Attending: Urology | Admitting: Urology

## 2022-05-30 DIAGNOSIS — C641 Malignant neoplasm of right kidney, except renal pelvis: Secondary | ICD-10-CM

## 2022-06-20 IMAGING — DX DG CHEST 2V
2 series · 2 of 2 positions shown · non-contrast
Comparison: November 30, 2021

CLINICAL DATA: History of kidney cancer.  Recent cough.

EXAM:
CHEST - 2 VIEW

[chest pa]
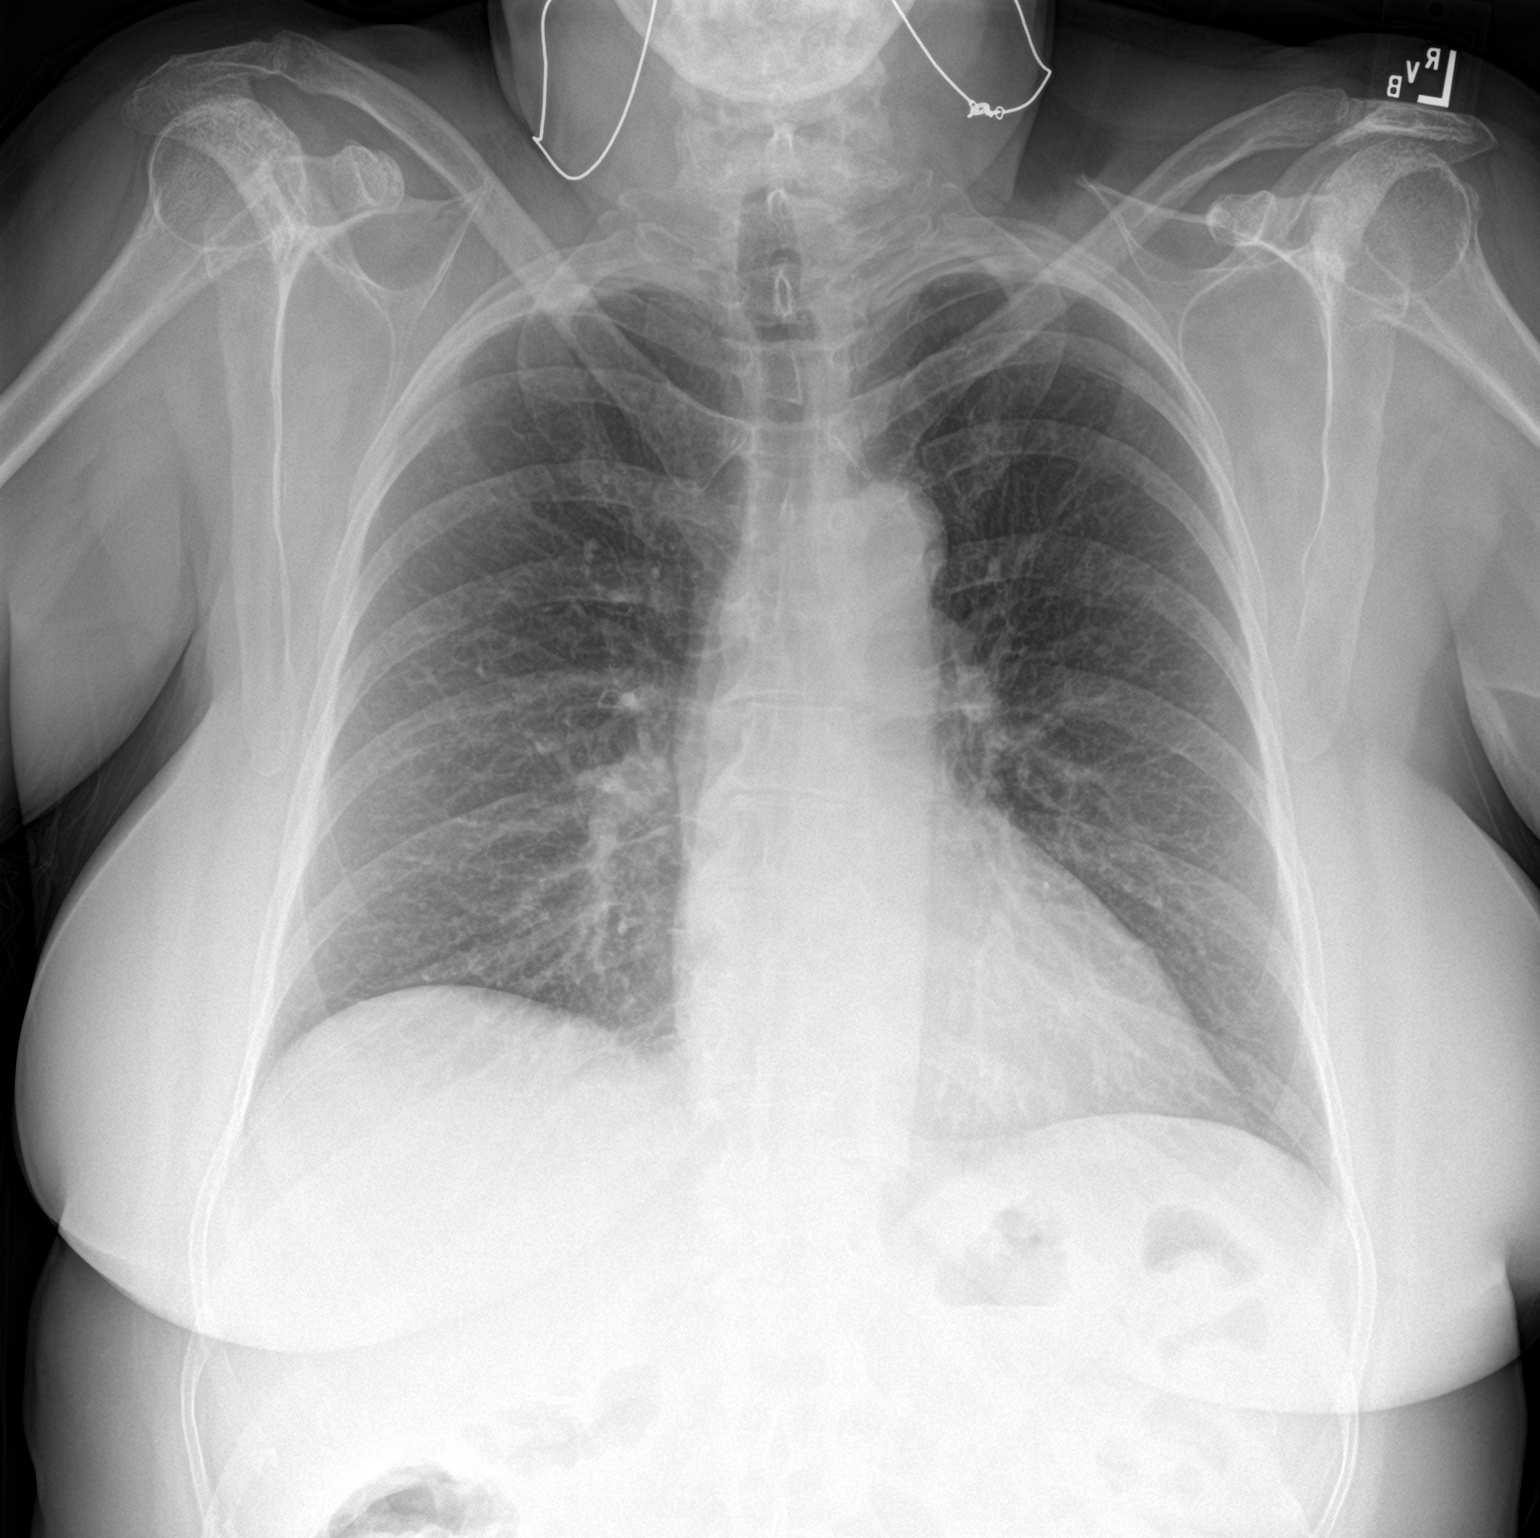

[chest lat]
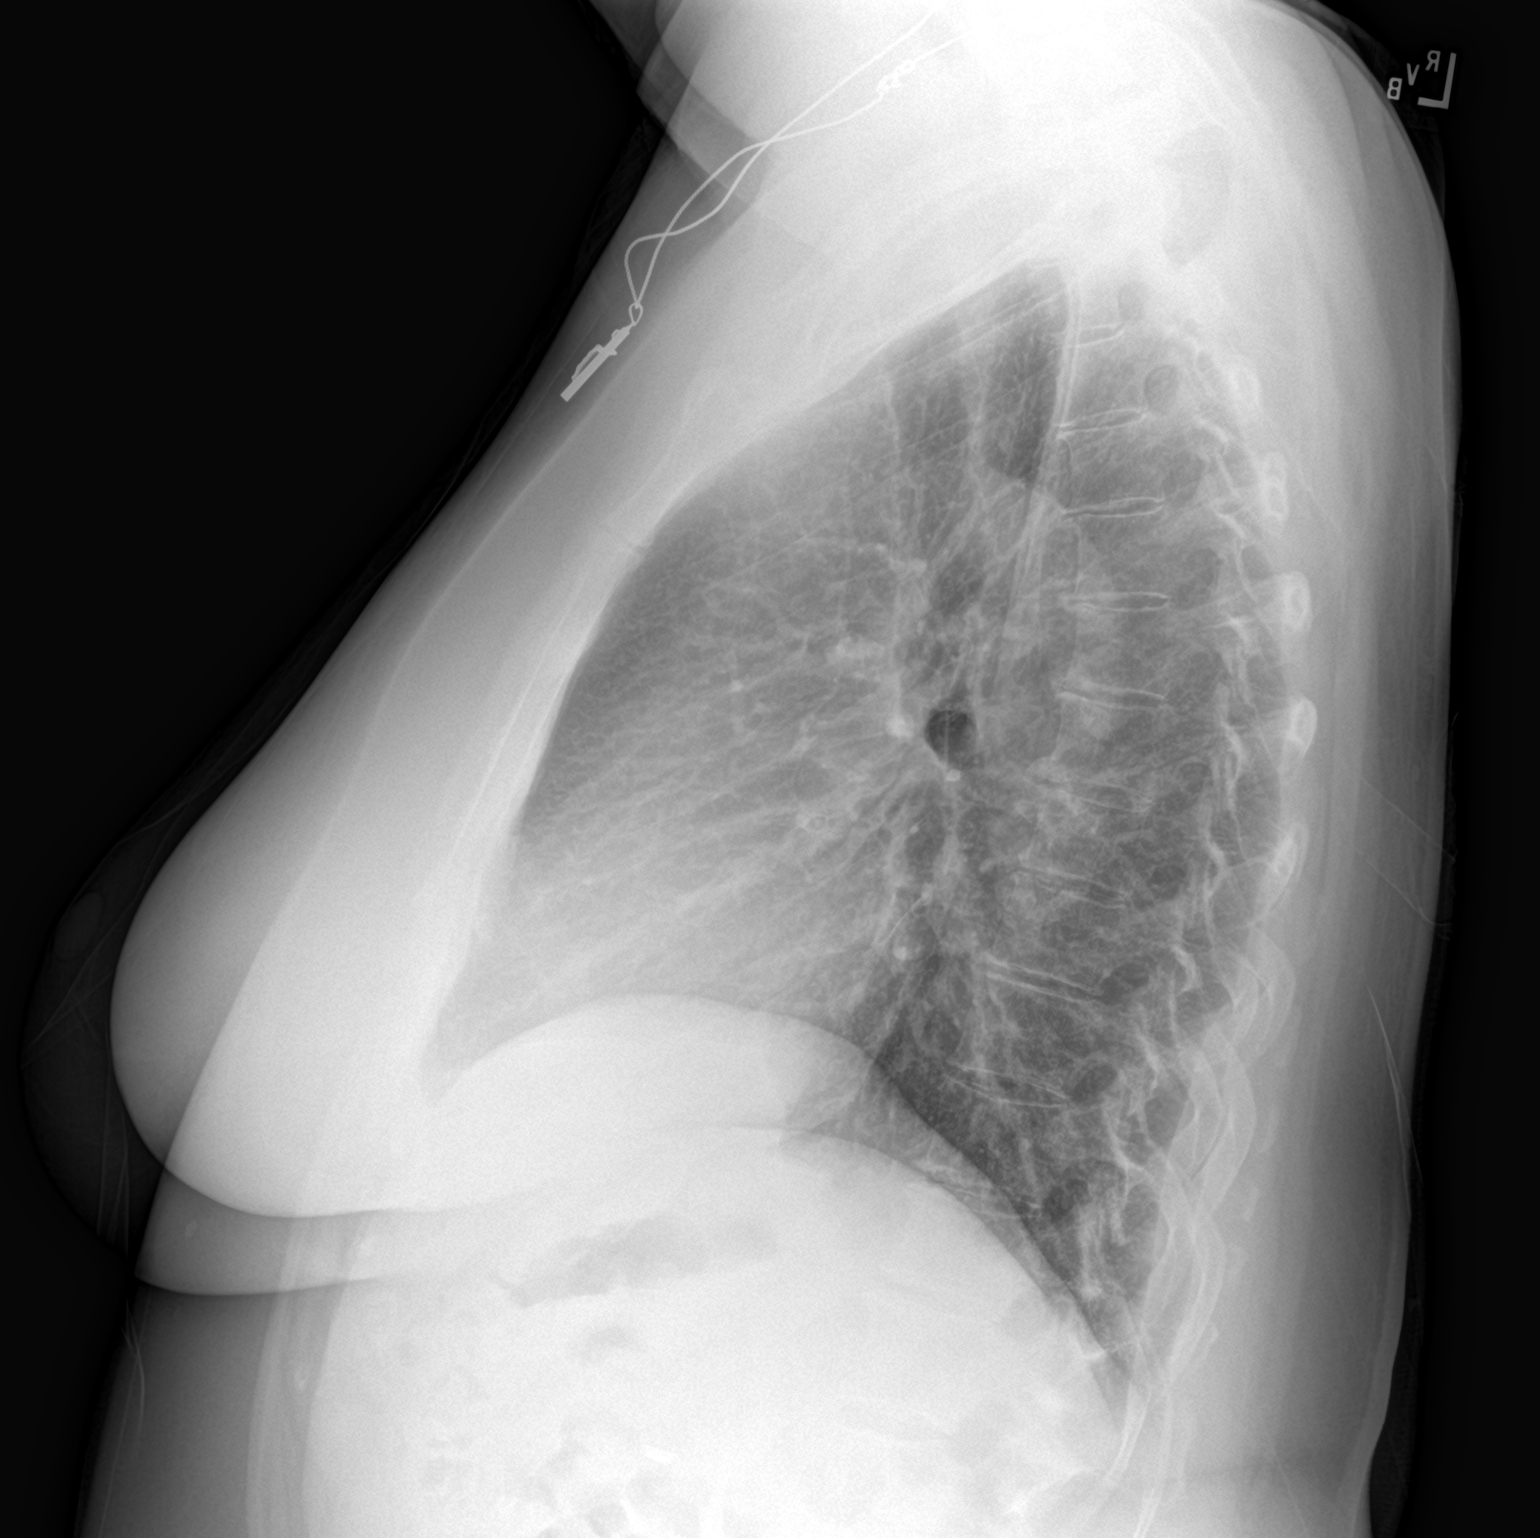

[2 of 2 positions shown; findings below may reference images not displayed]

FINDINGS: The heart size and mediastinal contours are within normal limits.
Both lungs are clear. The visualized skeletal structures are
unremarkable.
IMPRESSION: No active cardiopulmonary disease.

## 2023-08-26 ENCOUNTER — Other Ambulatory Visit (HOSPITAL_COMMUNITY): Payer: Self-pay | Admitting: Urology

## 2023-08-26 ENCOUNTER — Ambulatory Visit (HOSPITAL_COMMUNITY)
Admission: RE | Admit: 2023-08-26 | Discharge: 2023-08-26 | Disposition: A | Discharge status: Home / Self Care | Payer: BC Managed Care – PPO | Source: Ambulatory Visit | Attending: Urology | Admitting: Urology

## 2023-08-26 DIAGNOSIS — C641 Malignant neoplasm of right kidney, except renal pelvis: Secondary | ICD-10-CM | POA: Diagnosis present

## 2023-11-18 ENCOUNTER — Emergency Department (HOSPITAL_BASED_OUTPATIENT_CLINIC_OR_DEPARTMENT_OTHER): Payer: BC Managed Care – PPO

## 2023-11-18 ENCOUNTER — Encounter (HOSPITAL_BASED_OUTPATIENT_CLINIC_OR_DEPARTMENT_OTHER): Payer: Self-pay

## 2023-11-18 ENCOUNTER — Emergency Department (HOSPITAL_BASED_OUTPATIENT_CLINIC_OR_DEPARTMENT_OTHER)
Admission: EM | Admit: 2023-11-18 | Discharge: 2023-11-18 | Disposition: A | Discharge status: Home / Self Care | Payer: BC Managed Care – PPO | Attending: Emergency Medicine | Admitting: Emergency Medicine

## 2023-11-18 ENCOUNTER — Other Ambulatory Visit: Payer: Self-pay

## 2023-11-18 DIAGNOSIS — R791 Abnormal coagulation profile: Secondary | ICD-10-CM | POA: Diagnosis not present

## 2023-11-18 DIAGNOSIS — R1013 Epigastric pain: Secondary | ICD-10-CM | POA: Diagnosis not present

## 2023-11-18 DIAGNOSIS — R072 Precordial pain: Secondary | ICD-10-CM | POA: Diagnosis not present

## 2023-11-18 DIAGNOSIS — J45909 Unspecified asthma, uncomplicated: Secondary | ICD-10-CM | POA: Diagnosis not present

## 2023-11-18 DIAGNOSIS — I10 Essential (primary) hypertension: Secondary | ICD-10-CM | POA: Diagnosis not present

## 2023-11-18 DIAGNOSIS — R0789 Other chest pain: Secondary | ICD-10-CM | POA: Diagnosis present

## 2023-11-18 LAB — BASIC METABOLIC PANEL
Anion gap: 8 (ref 5–15)
BUN: 18 mg/dL (ref 8–23)
CO2: 29 mmol/L (ref 22–32)
Calcium: 9.4 mg/dL (ref 8.9–10.3)
Chloride: 103 mmol/L (ref 98–111)
Creatinine, Ser: 1.01 mg/dL — ABNORMAL HIGH (ref 0.44–1.00)
GFR, Estimated: >60 mL/min (ref >60)
Glucose, Bld: 70 mg/dL (ref 70–99)
Potassium: 3.8 mmol/L (ref 3.5–5.1)
Sodium: 140 mmol/L (ref 135–145)

## 2023-11-18 LAB — CBC
HCT: 37.4 % (ref 36.0–46.0)
Hemoglobin: 12.1 g/dL (ref 12.0–15.0)
MCH: 28.3 pg (ref 26.0–34.0)
MCHC: 32.4 g/dL (ref 30.0–36.0)
MCV: 87.6 fL (ref 80.0–100.0)
Platelets: 231 10*3/uL (ref 150–400)
RBC: 4.27 MIL/uL (ref 3.87–5.11)
RDW: 13.9 % (ref 11.5–15.5)
WBC: 7.2 10*3/uL (ref 4.0–10.5)
nRBC: 0 % (ref 0.0–0.2)

## 2023-11-18 LAB — TROPONIN I (HIGH SENSITIVITY)
Troponin I (High Sensitivity): 2 ng/L (ref <18)
Troponin I (High Sensitivity): 2 ng/L (ref <18)

## 2023-11-18 LAB — HEPATIC FUNCTION PANEL
ALT: 30 U/L (ref 0–44)
AST: 32 U/L (ref 15–41)
Albumin: 4.1 g/dL (ref 3.5–5.0)
Alkaline Phosphatase: 146 U/L — ABNORMAL HIGH (ref 38–126)
Bilirubin, Direct: 0.4 mg/dL — ABNORMAL HIGH (ref 0.0–0.2)
Indirect Bilirubin: 0.7 mg/dL (ref 0.3–0.9)
Total Bilirubin: 0.8 mg/dL (ref <1.2)
Total Protein: 7.2 g/dL (ref 6.5–8.1)

## 2023-11-18 LAB — LIPASE, BLOOD: Lipase: 42 U/L (ref 11–51)

## 2023-11-18 LAB — D-DIMER, QUANTITATIVE: D-Dimer, Quant: 1.22 ug{FEU}/mL — ABNORMAL HIGH (ref 0.00–0.50)

## 2023-11-18 MED ORDER — ALUM & MAG HYDROXIDE-SIMETH 200-200-20 MG/5ML PO SUSP
15.0000 mL | Freq: Once | ORAL | Status: AC
  Administered 2023-11-18: 15 mL via ORAL
  Filled 2023-11-18: qty 30

## 2023-11-18 MED ORDER — IOHEXOL 350 MG/ML SOLN
100.0000 mL | Freq: Once | INTRAVENOUS | Status: AC | PRN
  Administered 2023-11-18: 75 mL via INTRAVENOUS

## 2023-11-18 MED ORDER — SODIUM CHLORIDE 0.9 % IV BOLUS
500.0000 mL | Freq: Once | INTRAVENOUS | Status: AC
  Administered 2023-11-18: 500 mL via INTRAVENOUS

## 2023-11-18 NOTE — ED Notes (Signed)
Discharge instructions reviewed with patient. Patient verbalizes understanding, no further questions at this time. Medications and follow up information provided. No acute distress noted at time of departure.  

## 2023-11-18 NOTE — Discharge Instructions (Signed)
You were seen in the emergency room today with chest discomfort.  Your lab work did not show evidence of a heart attack and your CT scan did not show blood clots in the lungs.  Would like for you to continue your home medications and follow with your gastroenterologist as scheduled.  I have also placed a referral in our system for you to see the heart doctors.  If you develop any new or suddenly worsening symptoms please return to the emergency department immediately for reevaluation.

## 2023-11-18 NOTE — ED Notes (Signed)
Patient transported to X-ray 

## 2023-11-18 NOTE — ED Triage Notes (Signed)
C/o midsternal chest pain starting at 0400, now radiating across chest. Denies other symptoms. Pain is constant.

## 2023-11-18 NOTE — ED Provider Notes (Signed)
Emergency Department Provider Note   I have reviewed the triage vital signs and the nursing notes.   HISTORY  Chief Complaint Chest Pain   HPI Samantha Roberson is a 62 y.o. female with past medical history reviewed below including hiatal hernia presents to the emergency department with acute onset epigastric pain now spreading in a bandlike distribution across the chest.  Symptoms began at 4 AM and have been constant.  She states initially this seemed like her hiatal hernia pain but since then the pain has changed to a more bandlike pain across the top of her chest.  She does have some associated shortness of breath.  No recent travel or surgeries.  No fevers.  No vomiting or diaphoresis.  No history of AMI.    Past Medical History:  Diagnosis Date   Asthma    Allergy induced   Hypertension    No meds   Leg pain    Sinusitis    Thyroid nodule    Watching for 5 years   Tonsillitis     Review of Systems  Constitutional: No fever/chills Cardiovascular: Positive chest pain. Respiratory: Denies shortness of breath. Gastrointestinal: Positive epigastric abdominal pain.  No nausea, no vomiting.  No diarrhea.  No constipation. Genitourinary: Negative for dysuria. Musculoskeletal: Negative for back pain. Skin: Negative for rash. Neurological: Negative for headaches.  ____________________________________________   PHYSICAL EXAM:  VITAL SIGNS: ED Triage Vitals  Encounter Vitals Group     BP 11/18/23 0915 (!) 150/91     Pulse Rate 11/18/23 0915 73     Resp 11/18/23 0915 18     Temp 11/18/23 0913 (!) 97.4 F (36.3 C)     Temp Source 11/18/23 0913 Oral     SpO2 11/18/23 0915 100 %     Weight 11/18/23 0914 223 lb (101.2 kg)     Height 11/18/23 0914 5\' 9"  (1.753 m)   Constitutional: Alert and oriented. Well appearing and in no acute distress. Eyes: Conjunctivae are normal.  Head: Atraumatic. Nose: No congestion/rhinnorhea. Mouth/Throat: Mucous membranes are moist.   Neck: No stridor.   Cardiovascular: Normal rate, regular rhythm. Good peripheral circulation. Grossly normal heart sounds.   Respiratory: Normal respiratory effort.  No retractions. Lungs CTAB. Gastrointestinal: Soft with some mild mid-epigastric tenderness. No RUQ tenderness. No lower abdominal tenderness. No distention.  Musculoskeletal: No lower extremity tenderness nor edema. No gross deformities of extremities. Neurologic:  Normal speech and language. No gross focal neurologic deficits are appreciated.  Skin:  Skin is warm, dry and intact. No rash noted.   ____________________________________________   LABS (all labs ordered are listed, but only abnormal results are displayed)  Labs Reviewed  BASIC METABOLIC PANEL - Abnormal; Notable for the following components:      Result Value   Creatinine, Ser 1.01 (*)    All other components within normal limits  HEPATIC FUNCTION PANEL - Abnormal; Notable for the following components:   Alkaline Phosphatase 146 (*)    Bilirubin, Direct 0.4 (*)    All other components within normal limits  D-DIMER, QUANTITATIVE - Abnormal; Notable for the following components:   D-Dimer, Quant 1.22 (*)    All other components within normal limits  CBC  LIPASE, BLOOD  TROPONIN I (HIGH SENSITIVITY)  TROPONIN I (HIGH SENSITIVITY)   ____________________________________________  EKG   EKG Interpretation Date/Time:  Monday November 18 2023 09:18:48 EST Ventricular Rate:  71 PR Interval:  138 QRS Duration:  88 QT Interval:  384 QTC Calculation:  418 R Axis:   46  Text Interpretation: Sinus rhythm Confirmed by Alona Bene [phone]) on 11/18/2023 9:21:05 AM        ____________________________________________  RADIOLOGY  CT Angio Chest PE W and/or Wo Contrast  Result Date: 11/18/2023 CLINICAL DATA:  Pulmonary embolism (PE) suspected, low to intermediate prob, positive D-dimer. Chest pain and shortness of breath. EXAM: CT ANGIOGRAPHY CHEST WITH  CONTRAST TECHNIQUE: Multidetector CT imaging of the chest was performed using the standard protocol during bolus administration of intravenous contrast. Multiplanar CT image reconstructions and MIPs were obtained to evaluate the vascular anatomy. RADIATION DOSE REDUCTION: This exam was performed according to the departmental dose-optimization program which includes automated exposure control, adjustment of the mA and/or kV according to patient size and/or use of iterative reconstruction technique. CONTRAST:  75mL OMNIPAQUE IOHEXOL 350 MG/ML SOLN COMPARISON:  CT chest dated February 28, 2021. FINDINGS: Cardiovascular: Satisfactory opacification of the pulmonary arteries to the segmental level. No evidence of pulmonary embolism. Normal heart size. No pericardial effusion. The thoracic aorta is normal in caliber. Mediastinum/Nodes: No enlarged mediastinal, hilar, or axillary lymph nodes. Thyroid gland, trachea, and esophagus demonstrate no significant findings. Lungs/Pleura: No focal consolidation, pleural effusion, or pneumothorax. Stable small right middle lobe pulmonary nodules, measuring up to 5 mm (series 303, image 64 and image 60), essentially unchanged since December 2013, compatible with a benign etiology. No new suspicious pulmonary nodule. Upper Abdomen: No acute abnormality. Musculoskeletal: Status post left total shoulder arthroplasty. No acute osseous abnormality. No suspicious osseous lesion. Review of the MIP images confirms the above findings. IMPRESSION: 1. No evidence of acute pulmonary embolism. 2. No acute intrathoracic findings. Electronically Signed   By: Hart Robinsons M.D.   On: 11/18/2023 12:23   DG Chest 2 View  Result Date: 11/18/2023 CLINICAL DATA:  Chest pain and shortness of breath EXAM: CHEST - 2 VIEW COMPARISON:  08/26/2023 FINDINGS: Heart size and mediastinal contours are stable. No pleural fluid, interstitial edema or airspace disease. Previous left shoulder arthroplasty.  Degenerative changes noted within the thoracic spine. IMPRESSION: No acute cardiopulmonary disease. Electronically Signed   By: Signa Kell M.D.   On: 11/18/2023 09:33    ____________________________________________   PROCEDURES  Procedure(s) performed:   Procedures  None  ____________________________________________   INITIAL IMPRESSION / ASSESSMENT AND PLAN / ED COURSE  Pertinent labs & imaging results that were available during my care of the patient were reviewed by me and considered in my medical decision making (see chart for details).   This patient is Presenting for Evaluation of CP, which does require a range of treatment options, and is a complaint that involves a high risk of morbidity and mortality.  The Differential Diagnoses includes but is not exclusive to acute coronary syndrome, aortic dissection, pulmonary embolism, cardiac tamponade, community-acquired pneumonia, pericarditis, musculoskeletal chest wall pain, etc.   Critical Interventions-    Medications  sodium chloride 0.9 % bolus 500 mL (0 mLs Intravenous Stopped 11/18/23 1040)  alum & mag hydroxide-simeth (MAALOX/MYLANTA) 200-200-20 MG/5ML suspension 15 mL (15 mLs Oral Given 11/18/23 0952)  iohexol (OMNIPAQUE) 350 MG/ML injection 100 mL (75 mLs Intravenous Contrast Given 11/18/23 1041)    Reassessment after intervention: pain improved.     Clinical Laboratory Tests Ordered, included troponin negative x 2. D dimer elevated. No AKI. LFTs unremarkable.   Radiologic Tests Ordered, included CXR and CTA PE. I independently interpreted the images and agree with radiology interpretation.   Cardiac Monitor Tracing which shows NSR.  Social Determinants of Health Risk patient is a non-smoker.   Medical Decision Making: Summary:  Patient presents emergency department dilation of epigastric abdominal pain now radiating to the chest.  No fevers.  Vital signs are reassuring.  No hypoxemia.  Overall low suspicion  for PE although unable to apply the [facility] score due to age.  Plan for D-dimer, troponin, reassess.  Reevaluation with update and discussion with patient. Pain has resolved. CTA without PE or other acute process. Plan for continued symptom mgmt at home and GI follow up. Will refer to Cardiology as well since pain was atypical for her GI pain. Strict ED return precautions discussed.   Considered admission but ACS workup is largely unremarkable. Stable for outpatient follow up.   Patient's presentation is most consistent with acute presentation with potential threat to life or bodily function.   Disposition: discharge  ____________________________________________  FINAL CLINICAL IMPRESSION(S) / ED DIAGNOSES  Final diagnoses:  Precordial chest pain    Note:  This document was prepared using Dragon voice recognition software and may include unintentional dictation errors.  Alona Bene, MD, [facility] Emergency Medicine    Dvon Jiles, Arlyss Repress, MD 11/19/23 [phone]

## 2023-11-18 NOTE — ED Notes (Signed)
Patient transported to CT 

## 2024-01-16 ENCOUNTER — Other Ambulatory Visit (HOSPITAL_COMMUNITY): Payer: Self-pay | Admitting: Gastroenterology

## 2024-01-16 DIAGNOSIS — K743 Primary biliary cirrhosis: Secondary | ICD-10-CM

## 2024-01-21 ENCOUNTER — Ambulatory Visit: Payer: BC Managed Care – PPO | Admitting: Cardiology

## 2024-02-04 ENCOUNTER — Other Ambulatory Visit (HOSPITAL_COMMUNITY): Payer: BC Managed Care – PPO

## 2024-02-05 ENCOUNTER — Ambulatory Visit: Payer: BC Managed Care – PPO | Attending: Cardiology | Admitting: Cardiology

## 2024-02-05 ENCOUNTER — Encounter: Payer: Self-pay | Admitting: Cardiology

## 2024-02-05 ENCOUNTER — Other Ambulatory Visit: Payer: Self-pay | Admitting: Cardiology

## 2024-02-05 ENCOUNTER — Ambulatory Visit (INDEPENDENT_AMBULATORY_CARE_PROVIDER_SITE_OTHER): Payer: BC Managed Care – PPO

## 2024-02-05 VITALS — BP 128/88 | HR 84 | Ht 69.0 in | Wt 231.0 lb

## 2024-02-05 DIAGNOSIS — R079 Chest pain, unspecified: Secondary | ICD-10-CM | POA: Diagnosis not present

## 2024-02-05 DIAGNOSIS — I10 Essential (primary) hypertension: Secondary | ICD-10-CM | POA: Diagnosis not present

## 2024-02-05 DIAGNOSIS — R002 Palpitations: Secondary | ICD-10-CM

## 2024-02-05 LAB — BASIC METABOLIC PANEL
BUN/Creatinine Ratio: 18 (ref 12–28)
BUN: 17 mg/dL (ref 8–27)
CO2: 25 mmol/L (ref 20–29)
Calcium: 9.3 mg/dL (ref 8.7–10.3)
Chloride: 103 mmol/L (ref 96–106)
Creatinine, Ser: 0.96 mg/dL (ref 0.57–1.00)
Glucose: 80 mg/dL (ref 70–99)
Potassium: 4.3 mmol/L (ref 3.5–5.2)
Sodium: 141 mmol/L (ref 134–144)
eGFR: 66 mL/min/{1.73_m2} (ref >59)

## 2024-02-05 NOTE — Progress Notes (Unsigned)
 Enrolled for [employer] to mail a ZIO XT long term holter monitor to the patients address on file.

## 2024-02-05 NOTE — Patient Instructions (Signed)
Medication Instructions:  Your physician recommends that you continue on your current medications as directed. Please refer to the Current Medication list given to you today. *If you need a refill on your cardiac medications before your next appointment, please call your pharmacy*   Lab Work: BMET today If you have labs (blood work) drawn today and your tests are completely normal, you will receive your results only by: MyChart Message (if you have MyChart) OR A paper copy in the mail If you have any lab test that is abnormal or we need to change your treatment, we will call you to review the results.   Testing/Procedures: Coronary CTA - someone from the hospital will call you to schedule Your physician has requested that you have cardiac CT. Cardiac computed tomography (CT) is a painless test that uses an x-ray machine to take clear, detailed pictures of your heart. For further information please visit [email or web address] Please follow instruction sheet as given.  Zio Cardiac Event Monitor - this will be mailed to you via USPS Your physician has recommended that you wear an event monitor. Event monitors are medical devices that record the heart's electrical activity. Doctors most often [place] these monitors to diagnose arrhythmias. Arrhythmias are problems with the speed or rhythm of the heartbeat. The monitor is a small, portable device. You can wear one while you do your normal daily activities. This is usually used to diagnose what is causing palpitations/syncope (passing out).   Echocardiogram - this can be scheduled at checkout today Your physician has requested that you have an echocardiogram. Echocardiography is a painless test that uses sound waves to create images of your heart. It provides your doctor with information about the size and shape of your heart and how well your heart's chambers and valves are working. This procedure takes approximately one hour. There are no restrictions  for this procedure. Please do NOT wear cologne, perfume, aftershave, or lotions (deodorant is allowed). Please arrive 15 minutes prior to your appointment time.  Please note: We ask at that you not bring children with you during ultrasound (echo/ vascular) testing. Due to room size and safety concerns, children are not allowed in the ultrasound rooms during exams. Our front office staff cannot provide observation of children in our lobby area while testing is being conducted. An adult accompanying a patient to their appointment will only be allowed in the ultrasound room at the discretion of the ultrasound technician under special circumstances. We apologize for any inconvenience.    Follow-Up: At [facility], you and your health needs are our priority.  As part of our continuing mission to provide you with exceptional heart care, we have created designated Provider Care Teams.  These Care Teams include your primary Cardiologist (physician) and Advanced Practice Providers (APPs -  Physician Assistants and Nurse Practitioners) who all work together to provide you with the care you need, when you need it.  We recommend signing up for the patient portal called "MyChart".  Sign up information is provided on this After Visit Summary.  MyChart is used to connect with patients for Virtual Visits (Telemedicine).  Patients are able to view lab/test results, encounter notes, upcoming appointments, etc.  Non-urgent messages can be sent to your provider as well.   To learn more about what you can do with MyChart, go to [email or web address].    Your next appointment:   As Needed  Provider:   Dr Earnest Conroy- Long Term Monitor Instructions  Your  physician has requested you wear a ZIO patch monitor for 14 days.  This is a single patch monitor. [employer] supplies one patch monitor per enrollment. Additional stickers are not available. Please do not apply patch if you will be having a Nuclear  Stress Test,  Echocardiogram, Cardiac CT, MRI, or Chest Xray during the period you would be wearing the  monitor. The patch cannot be worn during these tests. You cannot remove and re-apply the  ZIO XT patch monitor.  Your ZIO patch monitor will be mailed 3 day USPS to your address on file. It may take 3-5 days  to receive your monitor after you have been enrolled.  Once you have received your monitor, please review the enclosed instructions. Your monitor  has already been registered assigning a specific monitor serial # to you.  Billing and Patient Assistance Program Information  We have supplied [employer] with any of your insurance information on file for billing purposes. [employer] offers a sliding scale Patient Assistance Program for patients that do not have  insurance, or whose insurance does not completely cover the cost of the ZIO monitor.  You must apply for the Patient Assistance Program to qualify for this discounted rate.  To apply, please call [employer] at [phone], select option 4, select option 2, ask to apply for  Patient Assistance Program. Meredeth Ide will ask your household income, and how many people  are in your household. They will quote your out-of-pocket cost based on that information.  [employer] will also be able to set up a 42-month, interest-free payment plan if needed.  Applying the monitor   Shave hair from upper left chest.  Hold abrader disc by orange tab. Rub abrader in 40 strokes over the upper left chest as  indicated in your monitor instructions.  Clean area with 4 enclosed alcohol pads. Let dry.  Apply patch as indicated in monitor instructions. Patch will be placed under collarbone on left  side of chest with arrow pointing upward.  Rub patch adhesive wings for 2 minutes. Remove white label marked "1". Remove the white  label marked "2". Rub patch adhesive wings for 2 additional minutes.  While looking in a mirror, press and release button in center of  patch. A small green light will  flash 3-4 times. This will be your only indicator that the monitor has been turned on.  Do not shower for the first 24 hours. You may shower after the first 24 hours.  Press the button if you feel a symptom. You will hear a small click. Record Date, Time and  Symptom in the Patient Logbook.  When you are ready to remove the patch, follow instructions on the last 2 pages of Patient  Logbook. Stick patch monitor onto the last page of Patient Logbook.  Place Patient Logbook in the blue and white box. Use locking tab on box and tape box closed  securely. The blue and white box has prepaid postage on it. Please place it in the mailbox as  soon as possible. Your physician should have your test results approximately 7 days after the  monitor has been mailed back to [facility].  Call [facility] Customer Care at [phone] if you have questions regarding  your ZIO XT patch monitor. Call them immediately if you see an orange light blinking on your  monitor.  If your monitor falls off in less than 4 days, contact our Monitor department at [phone].  If your monitor becomes loose or falls off after  4 days call [employer] at [phone] for  suggestions on securing your monitor     Your cardiac CT will be scheduled at:  - you will get a phone call to schedule this  [facility] [address] [place], [place] [address] [phone]  Please arrive at the [facility] and Children's Entrance (Entrance C2) of [facility] 30 minutes prior to test start time. You can use the FREE valet parking offered at entrance C (encouraged to control the heart rate for the test)  Proceed to the [facility] Radiology Department (first floor) to check-in and test prep.  All radiology patients and guests should use entrance C2 at [facility], accessed from [facility], even though the hospital's physical address listed is [address].     Please follow these instructions carefully (unless otherwise directed):  An IV will be required for this test and Nitroglycerin will be given.  Hold all erectile dysfunction medications at least 3 days (72 hrs) prior to test. (Ie viagra, cialis, sildenafil, tadalafil, etc)   On the Night Before the Test: Be sure to Drink plenty of water. Do not consume any caffeinated/decaffeinated beverages or chocolate 12 hours prior to your test. Do not take any antihistamines 12 hours prior to your test.  On the Day of the Test: Drink plenty of water until 1 hour prior to the test. Do not eat any food 1 hour prior to test. You may take your regular medications prior to the test.  Take metoprolol (Lopressor) two hours prior to test. If you take Furosemide/Hydrochlorothiazide/Spironolactone/Chlorthalidone, please HOLD on the morning of the test. Patients who wear a continuous glucose monitor MUST remove the device prior to scanning. FEMALES- please wear underwire-free bra if available, avoid dresses & tight clothing      After the Test: Drink plenty of water. After receiving IV contrast, you may experience a mild flushed feeling. This is normal. On occasion, you may experience a mild rash up to 24 hours after the test. This is not dangerous. If this occurs, you can take Benadryl 25 mg, Zyrtec, Claritin, or Allegra and increase your fluid intake. (Patients taking Tikosyn should avoid Benadryl, and may take Zyrtec, Claritin, or Allegra) If you experience trouble breathing, this can be serious. If it is severe call 911 IMMEDIATELY. If it is mild, please call our office.  We will call to schedule your test 2-4 weeks out understanding that some insurance companies will need an authorization prior to the service being performed.   For more information and frequently asked questions, please visit our website : [email or web address]  For non-scheduling related questions, please  contact the cardiac imaging nurse navigator should you have any questions/concerns: Cardiac Imaging Nurse Navigators Direct Office Dial: [phone]   For scheduling needs, including cancellations and rescheduling, please call [place], [phone].

## 2024-02-05 NOTE — Progress Notes (Signed)
Cardiology CONSULT Note    Date:  02/05/2024   ID:  Samantha Roberson, DOB February 07, 1961, MRN [id number]  PCP:  Caffie Damme, MD  Cardiologist:  Armanda Magic, MD   Chief Complaint  Patient presents with   New Patient (Initial Visit)    Chest pain    Patient Profile: Samantha Roberson is a 63 y.o. female who is being seen today for the evaluation of chest pain at the request of Caffie Damme, MD.  History of Present Illness:  Samantha Roberson is a 63 y.o. female who is being seen today for the evaluation of chest pain at the request of Caffie Damme, MD.  This is a 63 year old female with a history of hypertension currently diet controlled, asthma and a thyroid nodule.  She was seen in the emergency room on 11/18/2023 with complaints of chest pain.  She does have a history of hiatal hernia and developed acute onset of epigastric pain which spread like a band across her chest and began early in the morning while in bed.  There was no associated sx of nausea or vomiting or diaphoresis.  Her pain with hiatal hernia usually starts right in her epigastric area and never radiates across the chest and has  never has been band like before. Also she hurt when she takes a deep breath when she has her hiatal hernia pain.  She also had shortness of breath.  EKG in ER showed normal sinus rhythm with no acute ST changes.  Chest CT to rule out PE showed no evidence of PE and no mention of coronary artery calcifications.  It was felt that light her pain was GI in etiology.  She is now referred for further workup of chest pain to rule out a cardiac etiology.  She tells me that when she gets the epigastric pain it will be so intense that she will have to bend over.  Typically the pain is nonexertional.  It can occur at night or at rest during the day. She has never smoked before.  She has no fm hx of CAD.  Her mom had several strokes.  She has had a elevated alk pho for a while and is having a liver bx  done.  Past Medical History:  Diagnosis Date   Asthma    Allergy induced   Hypertension    No meds   Leg pain    Sinusitis    Thyroid nodule    Watching for 5 years   Tonsillitis     Past Surgical History:  Procedure Laterality Date   ABDOMINAL HYSTERECTOMY     ANAL FISSURECTOMY  1999   CHOLECYSTECTOMY N/A 05/17/2021   Procedure: LAPAROSCOPIC CHOLECYSTECTOMY;  Surgeon: Romie Levee, MD;  Location: [facility];  Service: General;  Laterality: N/A;   ENDOSCOPIC VEIN LASER TREATMENT     ERCP N/A 05/19/2021   Procedure: ENDOSCOPIC RETROGRADE CHOLANGIOPANCREATOGRAPHY (ERCP);  Surgeon: Jeani Hawking, MD;  Location: [facility];  Service: Endoscopy;  Laterality: N/A;   LAPAROSCOPIC NEPHRECTOMY, HAND ASSISTED Right 11/15/2020   Procedure: HAND ASSISTED LAPAROSCOPIC RADICAL NEPHRECTOMY;  Surgeon: Noel Christmas, MD;  Location: [facility];  Service: Urology;  Laterality: Right;  4 HRS   SHOULDER OPEN ROTATOR CUFF REPAIR     bil and right shoulder also had bicep repair   SPHINCTEROTOMY  05/19/2021   Procedure: SPHINCTEROTOMY;  Surgeon: Jeani Hawking, MD;  Location: [facility];  Service: Endoscopy;;   TONSILLECTOMY      Current  Medications: Current Meds  Medication Sig   acetaminophen (TYLENOL) 325 MG tablet Take 2 tablets (650 mg total) by mouth every 6 (six) hours as needed for mild pain (or Fever >/= 101).   aspirin 81 MG tablet Take 81 mg by mouth daily.   cholecalciferol (VITAMIN D3) 25 MCG (1000 UNIT) tablet Take 1,000 Units by mouth daily.   hydrochlorothiazide (HYDRODIURIL) 25 MG tablet Take 25 mg by mouth daily.   metoprolol succinate (TOPROL-XL) 25 MG 24 hr tablet Take 25 mg by mouth daily.   Multiple Vitamin (MULTIVITAMIN) capsule Take 1 capsule by mouth daily.    pantoprazole (PROTONIX) 40 MG tablet Take 1 tablet (40 mg total) by mouth daily.    Allergies:   Benadryl [diphenhydramine hcl], Grass extracts [gramineae pollens], Other, and Morphine   Social History    Socioeconomic History   Marital status: Married    Spouse name: Not on file   Number of children: Not on file   Years of education: Not on file   Highest education level: Not on file  Occupational History   Not on file  Tobacco Use   Smoking status: Never   Smokeless tobacco: Never  Vaping Use   Vaping status: Never Used  Substance and Sexual Activity   Alcohol use: Not Currently    Alcohol/week: 4.0 standard drinks of alcohol    Types: 4 Standard drinks or equivalent per week    Comment: wine   Drug use: No   Sexual activity: Not Currently    Birth control/protection: Surgical  Other Topics Concern   Not on file  Social History Narrative   Not on file   Social Drivers of Health   Financial Resource Strain: Not on file  Food Insecurity: Not on file  Transportation Needs: Not on file  Physical Activity: Not on file  Stress: Not on file  Social Connections: Not on file     Family History:  The patient's family history includes Cancer in her father; Hyperlipidemia in her father; Hypertension in her father, mother, and sister; Stroke in her mother.   ROS:   Please see the history of present illness.    ROS All other systems reviewed and are negative.      No data to display             PHYSICAL EXAM:   VS:  Pulse 84    GEN: Well nourished, well developed, in no acute distress  HEENT: normal  Neck: no JVD, carotid bruits, or masses Cardiac: RRR; no murmurs, rubs, or gallops,no edema.  Intact distal pulses bilaterally.  Respiratory:  clear to auscultation bilaterally, normal work of breathing GI: soft, nontender, nondistended, + BS MS: no deformity or atrophy  Skin: warm and dry, no rash Neuro:  Alert and Oriented x 3, Strength and sensation are intact Psych: euthymic mood, full affect  Wt Readings from Last 3 Encounters:  11/18/23 223 lb (101.2 kg)  05/19/21 225 lb (102.1 kg)  12/02/20 231 lb (104.8 kg)      Studies/Labs Reviewed:   EKG  Interpretation Date/Time:  Wednesday February 05 2024 10:48:59 EST Ventricular Rate:  81 PR Interval:  136 QRS Duration:  84 QT Interval:  378 QTC Calculation: 439 R Axis:   47  Text Interpretation: Normal sinus rhythm Normal ECG When compared with ECG of 18-Nov-2023 09:18, PREVIOUS ECG IS PRESENT Confirmed by Armanda Magic ([id number]) on 02/05/2024 10:52:32 AM    Cardiac Studies & Procedures   ______________________________________________________________________________________________  ECHOCARDIOGRAM  ECHOCARDIOGRAM COMPLETE 05/16/2021  Narrative ECHOCARDIOGRAM REPORT    Patient Name:   Samantha Roberson Date of Exam: 05/16/2021 Medical Rec #:  [id number]          Height:       69.0 in Accession #:    [id number]         Weight:       225.0 lb Date of Birth:  1961/09/29         BSA:          2.172 m Patient Age:    59 years           BP:           132/88 mmHg Patient Gender: F                  HR:           61 bpm. Exam Location:  Inpatient  Procedure: 2D Echo, Cardiac Doppler and Color Doppler  Indications:    Cardiomegaly I51.7  History:        Patient has no prior history of Echocardiogram examinations. Risk Factors:Hypertension.  Sonographer:    Eulah Pont RDCS Referring Phys: Consepcion Hearing ANASTASSIA DOUTOVA  IMPRESSIONS   1. Left ventricular ejection fraction, by estimation, is 60 to 65%. The left ventricle has normal function. The left ventricle has no regional wall motion abnormalities. There is mild left ventricular hypertrophy. Left ventricular diastolic parameters were normal. 2. Right ventricular systolic function is normal. The right ventricular size is normal. There is normal pulmonary artery systolic pressure. The estimated right ventricular systolic pressure is 24.5 mmHg. 3. The mitral valve is normal in structure. Trivial mitral valve regurgitation. 4. The aortic valve is tricuspid. Aortic valve regurgitation is not visualized. No aortic stenosis is  present. 5. The inferior vena cava is normal in size with greater than 50% respiratory variability, suggesting right atrial pressure of 3 mmHg.  FINDINGS Left Ventricle: Left ventricular ejection fraction, by estimation, is 60 to 65%. The left ventricle has normal function. The left ventricle has no regional wall motion abnormalities. The left ventricular internal cavity size was small. There is mild left ventricular hypertrophy. Left ventricular diastolic parameters were normal.  Right Ventricle: The right ventricular size is normal. No increase in right ventricular wall thickness. Right ventricular systolic function is normal. There is normal pulmonary artery systolic pressure. The tricuspid regurgitant velocity is 2.32 m/s, and with an assumed right atrial pressure of 3 mmHg, the estimated right ventricular systolic pressure is 24.5 mmHg.  Left Atrium: Left atrial size was normal in size.  Right Atrium: Right atrial size was normal in size.  Pericardium: There is no evidence of pericardial effusion.  Mitral Valve: The mitral valve is normal in structure. Trivial mitral valve regurgitation.  Tricuspid Valve: The tricuspid valve is normal in structure. Tricuspid valve regurgitation is trivial.  Aortic Valve: The aortic valve is tricuspid. Aortic valve regurgitation is not visualized. No aortic stenosis is present.  Pulmonic Valve: The pulmonic valve was not well visualized. Pulmonic valve regurgitation is trivial.  Aorta: The aortic root and ascending aorta are structurally normal, with no evidence of dilitation.  Venous: The inferior vena cava is normal in size with greater than 50% respiratory variability, suggesting right atrial pressure of 3 mmHg.  IAS/Shunts: The interatrial septum was not well visualized.   LEFT VENTRICLE PLAX 2D LVIDd:         3.60 cm  Diastology LVIDs:  2.30 cm  LV e' medial:    8.03 cm/s LV PW:         2.15 cm  LV E/e' medial:  10.0 LV IVS:         0.90 cm  LV e' lateral:   10.80 cm/s LVOT diam:     2.10 cm  LV E/e' lateral: 7.5 LV SV:         67 LV SV Index:   31 LVOT Area:     3.46 cm   RIGHT VENTRICLE RV S prime:     9.45 cm/s TAPSE (M-mode): 2.4 cm  LEFT ATRIUM             Index       RIGHT ATRIUM           Index LA diam:        2.70 cm 1.24 cm/m  RA Area:     16.40 cm LA Vol (A2C):   57.5 ml 26.47 ml/m RA Volume:   46.80 ml  21.55 ml/m LA Vol (A4C):   55.3 ml 25.46 ml/m LA Biplane Vol: 57.9 ml 26.66 ml/m AORTIC VALVE LVOT Vmax:   87.80 cm/s LVOT Vmean:  58.300 cm/s LVOT VTI:    0.194 m  AORTA Ao Root diam: 3.30 cm  MITRAL VALVE               TRICUSPID VALVE MV Area (PHT): 4.06 cm    TR Peak grad:   21.5 mmHg MV Decel Time: 187 msec    TR Vmax:        232.00 cm/s MV E velocity: 80.60 cm/s MV A velocity: 68.60 cm/s  SHUNTS MV E/A ratio:  1.17        Systemic VTI:  0.19 m Systemic Diam: 2.10 cm  Epifanio Lesches MD Electronically signed by Epifanio Lesches MD Signature Date/Time: 05/16/2021/1:53:51 PM    Final          ______________________________________________________________________________________________      Recent Labs: 11/18/2023: ALT 30; BUN 18; Creatinine, Ser 1.01; Hemoglobin 12.1; Platelets 231; Potassium 3.8; Sodium 140   Lipid Panel No results found for: "CHOL", "TRIG", "HDL", "CHOLHDL", "VLDL", "LDLCALC", "LDLDIRECT"  Additional studies/ records that were reviewed today include:  ER records including EKG, labs and progress notes    ASSESSMENT:    1. Chest pain of uncertain etiology   2. Essential hypertension      PLAN:  In order of problems listed above:  Chest pain -Felt to likely be GI in nature and ER given her history of hiatal hernia and bandlike nature of the discomfort in epigastric region -EKG was normal in the ER as well as high-sensitivity troponin and chest CT showed no PE -it was felt that her sx were GI related from hiatal hernia but I have  not seen any mention on prior MRI of abd or CT indicating a hiatal hernia -Cardiac risk factors include postmenopausal state, hypertension  -I have recommended that we get a coronary CTA to define coronary anatomy -if no CAD then recommend referral back to GI -check 2D echo  Hypertension -BP controlled on exam today -Continue prescription drug management with Toprol-XL 25 mg daily with as needed refills  Time Spent: 20 minutes total time of encounter, including 15 minutes spent in face-to-face patient care on the date of this encounter. This time includes coordination of care and counseling regarding above mentioned problem list. Remainder of non-face-to-face time involved reviewing chart documents/testing relevant to the patient encounter  and documentation in the medical record. I have independently reviewed documentation from referring provider  Followup:  PRN  Medication Adjustments/Labs and Tests Ordered: Current medicines are reviewed at length with the patient today.  Concerns regarding medicines are outlined above.  Medication changes, Labs and Tests ordered today are listed in the Patient Instructions below.  There are no Patient Instructions on file for this visit.   Signed, Armanda Magic, MD  02/05/2024 10:53 AM    [facility] [address] [place], [place], [place]  [address] Phone: [phone]; Fax: [phone]

## 2024-02-07 ENCOUNTER — Ambulatory Visit: Payer: BC Managed Care – PPO | Admitting: Cardiology

## 2024-02-07 ENCOUNTER — Ambulatory Visit: Payer: BC Managed Care – PPO | Admitting: Internal Medicine

## 2024-02-09 DIAGNOSIS — R079 Chest pain, unspecified: Secondary | ICD-10-CM

## 2024-02-09 DIAGNOSIS — I10 Essential (primary) hypertension: Secondary | ICD-10-CM

## 2024-02-09 DIAGNOSIS — R002 Palpitations: Secondary | ICD-10-CM | POA: Diagnosis not present

## 2024-02-14 ENCOUNTER — Other Ambulatory Visit (HOSPITAL_COMMUNITY): Payer: Self-pay | Admitting: Radiology

## 2024-02-14 DIAGNOSIS — R7989 Other specified abnormal findings of blood chemistry: Secondary | ICD-10-CM

## 2024-02-17 ENCOUNTER — Other Ambulatory Visit: Payer: Self-pay

## 2024-02-17 ENCOUNTER — Ambulatory Visit (HOSPITAL_COMMUNITY)
Admission: RE | Admit: 2024-02-17 | Discharge: 2024-02-17 | Disposition: A | Discharge status: Home / Self Care | Payer: BC Managed Care – PPO | Source: Ambulatory Visit | Attending: Gastroenterology | Admitting: Gastroenterology

## 2024-02-17 ENCOUNTER — Encounter (HOSPITAL_COMMUNITY): Payer: Self-pay

## 2024-02-17 DIAGNOSIS — R748 Abnormal levels of other serum enzymes: Secondary | ICD-10-CM | POA: Diagnosis present

## 2024-02-17 DIAGNOSIS — K743 Primary biliary cirrhosis: Secondary | ICD-10-CM

## 2024-02-17 DIAGNOSIS — R7989 Other specified abnormal findings of blood chemistry: Secondary | ICD-10-CM

## 2024-02-17 DIAGNOSIS — I10 Essential (primary) hypertension: Secondary | ICD-10-CM | POA: Insufficient documentation

## 2024-02-17 LAB — CBC
HCT: 36.7 % (ref 36.0–46.0)
Hemoglobin: 11.7 g/dL — ABNORMAL LOW (ref 12.0–15.0)
MCH: 27.5 pg (ref 26.0–34.0)
MCHC: 31.9 g/dL (ref 30.0–36.0)
MCV: 86.4 fL (ref 80.0–100.0)
Platelets: 207 10*3/uL (ref 150–400)
RBC: 4.25 MIL/uL (ref 3.87–5.11)
RDW: 14 % (ref 11.5–15.5)
WBC: 7.4 10*3/uL (ref 4.0–10.5)
nRBC: 0 % (ref 0.0–0.2)

## 2024-02-17 LAB — PROTIME-INR
INR: 0.9 (ref 0.8–1.2)
Prothrombin Time: 12.6 s (ref 11.4–15.2)

## 2024-02-17 MED ORDER — MIDAZOLAM HCL 2 MG/2ML IJ SOLN
INTRAMUSCULAR | Status: AC | PRN
  Administered 2024-02-17 (×2): 1 mg via INTRAVENOUS

## 2024-02-17 MED ORDER — MIDAZOLAM HCL 2 MG/2ML IJ SOLN
INTRAMUSCULAR | Status: AC
  Filled 2024-02-17: qty 2

## 2024-02-17 MED ORDER — FENTANYL CITRATE (PF) 100 MCG/2ML IJ SOLN
INTRAMUSCULAR | Status: AC | PRN
  Administered 2024-02-17 (×2): 50 ug via INTRAVENOUS

## 2024-02-17 MED ORDER — LIDOCAINE HCL (PF) 1 % IJ SOLN
10.0000 mL | Freq: Once | INTRAMUSCULAR | Status: AC
  Administered 2024-02-17: 10 mL via INTRADERMAL

## 2024-02-17 MED ORDER — FENTANYL CITRATE (PF) 100 MCG/2ML IJ SOLN
INTRAMUSCULAR | Status: AC
  Filled 2024-02-17: qty 2

## 2024-02-17 NOTE — H&P (Signed)
 Chief Complaint: Patient was seen in consultation today for abnormal liver enzymes at the request of Shahid,Shabana  Referring Physician(s): [id number]  Supervising Physician: Ruel Favors  Patient Status: [facility] - Out-pt  History of Present Illness: Samantha Roberson is a 63 y.o. female with PMHs of HTN, s/p right radical nephrectomy, s/p cholecystectomy in 2022, and elevated alk phos and positive AMA who presents for random liver biopsy.   Patient is being followed by [facility] medical GI, underwent EGD on 11/08/23 due to epigastric pain which showed mild gastritis, bx was done and positive for Barrett' esophagus. Serologic work up showed elevated alk phos and AMA, no recent abdominal imaging in EMR. Random liver bx was recommended to the patient due to concern for PBC which she decided to proceed.   Patient laying in bed, not in acute distress.  Denise headache, fever, chills, shortness of breath, cough, chest pain, abdominal pain, nausea ,vomiting, and bleeding.   Past Medical History:  Diagnosis Date   Asthma    Allergy induced   Hypertension    No meds   Leg pain    Sinusitis    Thyroid nodule    Watching for 5 years   Tonsillitis     Past Surgical History:  Procedure Laterality Date   ABDOMINAL HYSTERECTOMY     ANAL FISSURECTOMY  1999   CHOLECYSTECTOMY N/A 05/17/2021   Procedure: LAPAROSCOPIC CHOLECYSTECTOMY;  Surgeon: Romie Levee, MD;  Location: [facility];  Service: General;  Laterality: N/A;   ENDOSCOPIC VEIN LASER TREATMENT     ERCP N/A 05/19/2021   Procedure: ENDOSCOPIC RETROGRADE CHOLANGIOPANCREATOGRAPHY (ERCP);  Surgeon: Jeani Hawking, MD;  Location: [facility];  Service: Endoscopy;  Laterality: N/A;   LAPAROSCOPIC NEPHRECTOMY, HAND ASSISTED Right 11/15/2020   Procedure: HAND ASSISTED LAPAROSCOPIC RADICAL NEPHRECTOMY;  Surgeon: Noel Christmas, MD;  Location: [facility];  Service: Urology;  Laterality: Right;  4 HRS   SHOULDER OPEN ROTATOR CUFF REPAIR      bil and right shoulder also had bicep repair   SPHINCTEROTOMY  05/19/2021   Procedure: SPHINCTEROTOMY;  Surgeon: Jeani Hawking, MD;  Location: [facility];  Service: Endoscopy;;   TONSILLECTOMY      Allergies: Benadryl [diphenhydramine hcl], Grass extracts [gramineae pollens], Other, and Morphine  Medications: Prior to Admission medications   Medication Sig Start Date End Date Taking? Authorizing Provider  acetaminophen (TYLENOL) 325 MG tablet Take 2 tablets (650 mg total) by mouth every 6 (six) hours as needed for mild pain (or Fever >/= 101). 05/20/21  Yes Sheikh, Omair Latif, DO  cholecalciferol (VITAMIN D3) 25 MCG (1000 UNIT) tablet Take 1,000 Units by mouth daily.   Yes [provider]  Multiple Vitamin (MULTIVITAMIN) capsule Take 1 capsule by mouth daily.    Yes [provider]  pantoprazole (PROTONIX) 40 MG tablet Take 1 tablet (40 mg total) by mouth daily. 10/18/20 02/17/24 Yes Long, Arlyss Repress, MD  aspirin 81 MG tablet Take 81 mg by mouth daily.    [provider]  hydrochlorothiazide (HYDRODIURIL) 25 MG tablet Take 25 mg by mouth daily.    [provider]  metoprolol succinate (TOPROL-XL) 25 MG 24 hr tablet Take 25 mg by mouth daily. 04/30/21   [provider]     Family History  Problem Relation Age of Onset   Stroke Mother    Hypertension Mother    Cancer Father    Hypertension Father    Hyperlipidemia Father    Hypertension Sister     Social  History   Socioeconomic History   Marital status: Married    Spouse name: Not on file   Number of children: Not on file   Years of education: Not on file   Highest education level: Not on file  Occupational History   Not on file  Tobacco Use   Smoking status: Never   Smokeless tobacco: Never  Vaping Use   Vaping status: Never Used  Substance and Sexual Activity   Alcohol use: Not Currently    Alcohol/week: 4.0 standard drinks of alcohol    Types: 4 Standard drinks or equivalent per  week    Comment: wine   Drug use: No   Sexual activity: Not Currently    Birth control/protection: Surgical  Other Topics Concern   Not on file  Social History Narrative   Not on file   Social Drivers of Health   Financial Resource Strain: Not on file  Food Insecurity: Not on file  Transportation Needs: Not on file  Physical Activity: Not on file  Stress: Not on file  Social Connections: Not on file     Review of Systems: A 12 point ROS discussed and pertinent positives are indicated in the HPI above.  All other systems are negative.  Vital Signs: BP (!) 147/92   Pulse 85   Temp 98.3 F (36.8 C) (Oral)   Resp 16   Ht 5\' 8"  (1.727 m)   Wt 230 lb (104.3 kg)   SpO2 96%   BMI 34.97 kg/m    Physical Exam Vitals and nursing note reviewed.  Constitutional:      General: Patient is not in acute distress.    Appearance: Normal appearance. Patient is not ill-appearing.  HENT:     Head: Normocephalic and atraumatic.     Mouth/Throat:     Mouth: Mucous membranes are moist.     Pharynx: Oropharynx is clear.  Cardiovascular:     Rate and Rhythm: Normal rate and regular rhythm.     Pulses: Normal pulses.     Heart sounds: Normal heart sounds.  Pulmonary:     Effort: Pulmonary effort is normal.     Breath sounds: Normal breath sounds.  Abdominal:     General: Abdomen is flat. Bowel sounds are normal.     Palpations: Abdomen is soft.  Musculoskeletal:     Cervical back: Neck supple.  Skin:    General: Skin is warm and dry.     Coloration: Skin is not jaundiced or pale.  Neurological:     Mental Status: Patient is alert and oriented to person, place, and time.  Psychiatric:        Mood and Affect: Mood normal.        Behavior: Behavior normal.        Judgment: Judgment normal.    MD Evaluation Airway: WNL Heart: WNL Abdomen: WNL Chest/ Lungs: WNL ASA  Classification: 2 Mallampati/Airway Score: Two  Imaging: No results found.  Labs:  CBC: Recent Labs     11/18/23 0941 02/17/24 1142  WBC 7.2 7.4  HGB 12.1 11.7*  HCT 37.4 36.7  PLT 231 207    COAGS: Recent Labs    02/17/24 1142  INR 0.9    BMP: Recent Labs    11/18/23 0941 02/05/24 1137  NA 140 141  K 3.8 4.3  CL 103 103  CO2 29 25  GLUCOSE 70 80  BUN 18 17  CALCIUM 9.4 9.3  CREATININE 1.01* 0.96  GFRNONAA >60  --  LIVER FUNCTION TESTS: Recent Labs    11/18/23 0941  BILITOT 0.8  AST 32  ALT 30  ALKPHOS 146*  PROT 7.2  ALBUMIN 4.1    TUMOR MARKERS: No results for input(s): "AFPTM", "CEA", "CA199", "CHROMGRNA" in the last 8760 hours.  Assessment and Plan: 63 y.o. female with elevated alk phos and positive AMA who presents for random liver bx.   NPO since MN BP 147/92, VS stable  CBC stable INR 0.9  On ASA, has been held x 5 days  Allergies reviewed   Risks and benefits of random liver bx  was discussed with the patient and/or patient's family including, but not limited to bleeding, infection, damage to adjacent structures or low yield requiring additional tests.  All of the questions were answered and there is agreement to proceed.  Consent signed and in chart.     Thank you for this interesting consult.  I greatly enjoyed meeting TARIYA MORRISSETTE and look forward to participating in their care.  A copy of this report was sent to the requesting provider on this date.  Electronically Signed: Willette Brace, PA-C 02/17/2024, 12:28 PM   I spent a total of  30 Minutes   in face to face in clinical consultation, greater than 50% of which was counseling/coordinating care for random liver bx.   This chart was dictated using voice recognition software.  Despite best efforts to proofread,  errors can occur which can change the documentation meaning.

## 2024-02-17 NOTE — Procedures (Signed)
 Interventional Radiology Procedure Note  Procedure: [place] RT LIVER RANDOM CORE BX    Complications: None  Estimated Blood Loss:  MIN  Findings: 18 G CORES X 2    M. Ruel Favors, MD

## 2024-02-19 LAB — SURGICAL PATHOLOGY

## 2024-02-26 ENCOUNTER — Telehealth (HOSPITAL_COMMUNITY): Payer: Self-pay | Admitting: *Deleted

## 2024-02-26 MED ORDER — METOPROLOL TARTRATE 100 MG PO TABS: ORAL_TABLET | ORAL | 0 refills | Status: DC

## 2024-02-26 NOTE — Telephone Encounter (Signed)
 Attempted to call patient regarding upcoming cardiac CT appointment. Left message on voicemail with name and callback number  Larey Brick RN Navigator Cardiac Imaging [facility] [phone] Office [phone] Cell

## 2024-02-26 NOTE — Telephone Encounter (Signed)
 Patient returning call about CT scan upcoming cardiac imaging study; pt verbalizes understanding of appt date/time, parking situation and where to check in, pre-test NPO status and medications ordered, and verified current allergies; name and call back number provided for further questions should they arise  Larey Brick RN Navigator Cardiac Imaging [facility] [phone] office [phone] cell  Patient to take 100mg  metoprolol tartrate two hours prior to her cardiac CT scan. She is aware to arrive at 7:30 AM.

## 2024-02-28 ENCOUNTER — Ambulatory Visit (HOSPITAL_COMMUNITY)
Admission: RE | Admit: 2024-02-28 | Discharge: 2024-02-28 | Disposition: A | Discharge status: Home / Self Care | Payer: BC Managed Care – PPO | Source: Ambulatory Visit | Attending: Cardiology | Admitting: Cardiology

## 2024-02-28 DIAGNOSIS — R079 Chest pain, unspecified: Secondary | ICD-10-CM | POA: Diagnosis present

## 2024-02-28 DIAGNOSIS — I10 Essential (primary) hypertension: Secondary | ICD-10-CM | POA: Insufficient documentation

## 2024-02-28 MED ORDER — DILTIAZEM HCL 25 MG/5ML IV SOLN: 10.0000 mg | INTRAVENOUS | Status: DC | PRN

## 2024-02-28 MED ORDER — IOHEXOL 350 MG/ML SOLN
100.0000 mL | Freq: Once | INTRAVENOUS | Status: AC | PRN
  Administered 2024-02-28: 100 mL via INTRAVENOUS

## 2024-02-28 MED ORDER — NITROGLYCERIN 0.4 MG SL SUBL
SUBLINGUAL_TABLET | SUBLINGUAL | Status: AC
  Filled 2024-02-28: qty 2

## 2024-02-28 MED ORDER — METOPROLOL TARTRATE 5 MG/5ML IV SOLN: 10.0000 mg | Freq: Once | INTRAVENOUS | Status: DC | PRN

## 2024-02-28 MED ORDER — NITROGLYCERIN 0.4 MG SL SUBL
0.8000 mg | SUBLINGUAL_TABLET | Freq: Once | SUBLINGUAL | Status: AC
  Administered 2024-02-28: 0.8 mg via SUBLINGUAL

## 2024-03-02 ENCOUNTER — Ambulatory Visit (HOSPITAL_COMMUNITY): Payer: BC Managed Care – PPO | Attending: Cardiology

## 2024-03-02 ENCOUNTER — Telehealth: Payer: Self-pay

## 2024-03-02 DIAGNOSIS — R079 Chest pain, unspecified: Secondary | ICD-10-CM | POA: Insufficient documentation

## 2024-03-02 DIAGNOSIS — I10 Essential (primary) hypertension: Secondary | ICD-10-CM | POA: Insufficient documentation

## 2024-03-02 LAB — ECHOCARDIOGRAM COMPLETE
Area-P 1/2: 2.91 cm2
S' Lateral: 3 cm

## 2024-03-02 NOTE — Telephone Encounter (Signed)
 Pt retuning call requesting cb

## 2024-03-02 NOTE — Telephone Encounter (Signed)
Done, see result note 

## 2024-03-02 NOTE — Telephone Encounter (Signed)
LVMTCB 3/17

## 2024-03-09 ENCOUNTER — Telehealth: Payer: Self-pay

## 2024-03-09 NOTE — Telephone Encounter (Signed)
-----   Message from Armanda Magic sent at 03/07/2024  6:56 PM EDT ----- Heart monitor showed several episodes of SVT which is a fast heart rhythm from the top of the heart up to 10 beats in a row.  There were rare extra heartbeats from the top of the heart called PACs and from the bottom of the heart called PVCs which are benign.  Please find out if she has had any further palpitations.  We could opt to increase her Toprol if she would like

## 2024-03-09 NOTE — Telephone Encounter (Signed)
 Spoke with pt regarding her heart monitor results. Pt verbalized understanding. All questions, if any, were answered. Pt stated she is seeing her kidney doctor on 4/7 and would like to see what he says about increasing the Toprol XL before doing so. Otherwise she stated that if Dr. Mayford Knife would like her to increase her Toprol XL she would be fine with doing so. Pt stated she would call back after her appointment on 4/7.

## 2024-03-19 ENCOUNTER — Telehealth: Payer: Self-pay | Admitting: Cardiology

## 2024-03-19 NOTE — Telephone Encounter (Signed)
 Patient stated she was returning a call regarding test results.

## 2024-03-19 NOTE — Telephone Encounter (Signed)
 03/07/24  6:56 PM Per Dr. Mayford Knife, Heart monitor showed several episodes of SVT which is a fast heart rhythm from the top of the heart up to 10 beats in a row.  There were rare extra heartbeats from the top of the heart called PACs and from the bottom of the heart called PVCs which are benign.  Please find out if she has had any further palpitations.  We could opt to increase her Toprol if she would like     Samantha Reichert, MD 03/16/2024  2:10 PM EDT     None coronary part of coronary CT showed a stable small and benign-appearing 2 mm 5 mm anterior right middle lobe pulmonary nodules.  Please forward this to patient's PCP to review and follow-up on    Called patient with CT results. Sent copy of results to her PCP. Patient wanted to increase her Toprol, due to continued palpitations. Dr. Mayford Knife was going to increase the dose, will clarify that patient needs to increase to Toprol to 50 mg daily.

## 2024-03-24 MED ORDER — METOPROLOL SUCCINATE ER 50 MG PO TB24: 50.0000 mg | ORAL_TABLET | Freq: Every day | ORAL | 1 refills | Status: DC

## 2024-03-24 NOTE — Telephone Encounter (Signed)
 Spoke with pt who states she saw her renal provider yesterday who states increasing Metoprolol will be fine.  Pt uses Karin Golden, [employer]. Pt advised will forward to Dr Mayford Knife to make her aware and advise further on Metoprolol increase.

## 2024-03-24 NOTE — Telephone Encounter (Signed)
 Spoke with pt and advised of Dr Norris Cross recommendations as below.  Will forward to scheduling to assist with APP appointment.  Pt verbalizes understanding and agrees with current plan.

## 2024-03-24 NOTE — Telephone Encounter (Signed)
 Pt returning call

## 2024-04-02 NOTE — Telephone Encounter (Signed)
 Pt has been scheduled with M.Lenze, PA-C on 04/28/24

## 2024-04-08 ENCOUNTER — Telehealth: Payer: Self-pay

## 2024-04-08 NOTE — Telephone Encounter (Signed)
-----   Message from Gaylyn Keas sent at 03/16/2024  2:10 PM EDT ----- None coronary part of coronary CT showed a stable small and benign-appearing 2 mm 5 mm anterior right middle lobe pulmonary nodules.  Please forward this to patient's PCP to review and follow-up on

## 2024-04-08 NOTE — Telephone Encounter (Signed)
 Spoke with pt regarding her results. Pt stated someone had already called her about these results and that the scan had already been sent to her PCP. Will resend to be sure they received it. Pt was advised to follow up with PCP. Pt verbalized understanding. All questions if any were answered.

## 2024-04-20 NOTE — Progress Notes (Signed)
 Cardiology Office Note:  .   Date:  04/28/2024  ID:  Samantha Roberson, DOB [birth year], MRN [id number] PCP: Bertrum Brodie, MD  [facility] Providers Cardiologist:  Gaylyn Keas, MD    History of Present Illness: .   Samantha Roberson is a 63 y.o. female with history of HTN,, asthma and thyroid nodule. Patient saw Dr. Micael Adas 01/2024 for chest pain felt to be GI in nature and history of HH. Normal EKG and CT in ED no PE. Coronary CTA-normal, Echo-normal LVEF mild LVH, zio SVT-toprol  increased.  Patient comes in for f/u.  Her BP cuff told her she was having an arrhythmia a couple times. She notices it most when she lays down on her left. It only lasts a couple of seconds. She drinks black tea all day long-probably 64 ounces. She has some leg swelling and eats fast food. Only walking 15 min daily.  ROS:    Studies Reviewed: Aaron Aas         Prior CV Studies:   Coronary CTA 02/2024 IMPRESSION: 1. Coronary artery calcium score 0 Agatston units. This suggests low risk for future cardiac events.   2.  No significant coronary disease noted.   [employer] account executive   Electronically Signed: By: Peder Bourdon M.D. On: 02/28/2024 18:28  IMPRESSION: 1. Stable common benign 2 mm and 5 mm anterior right middle lobe pulmonary nodules. 2. Mild amount of pneumobilia.  Echo 02/2024 IMPRESSIONS     1. Left ventricular ejection fraction, by estimation, is 60 to 65%. Left  ventricular ejection fraction by 3D volume is 61 %. The left ventricle has  normal function. The left ventricle has no regional wall motion  abnormalities. There is mild concentric  left ventricular hypertrophy. Left ventricular diastolic parameters were  normal. The average left ventricular global longitudinal strain is -18.4  %. The global longitudinal strain is normal.   2. Right ventricular systolic function is normal. The right ventricular  size is normal. There is normal pulmonary artery systolic pressure.   3. The  mitral valve is normal in structure. Trivial mitral valve  regurgitation. No evidence of mitral stenosis.   4. The aortic valve is normal in structure. Aortic valve regurgitation is  not visualized. No aortic stenosis is present.   5. The inferior vena cava is normal in size with greater than 50%  respiratory variability, suggesting right atrial pressure of 3 mmHg.   Zio 02/2024  Predominant rhythm with average heart rate 89bpm. The HR ranged from 57 to 139bpm.   SVT consistent with paroxysmal atrial tachcyardia with longest run 10beats and fastest heart rate 132bpm.   Rare PACs, atrial couplets and triplets   Occasional PVCs (PVC load 1.7%).  Rare ventricular couplets, ventricular trigeminy.     Patch Wear Time:  13 days and 17 hours (2025-02-23T20:06:45-0500 to 2025-03-09T14:09:51-0400)   Patient had a min HR of 57 bpm, max HR of 182 bpm, and avg HR of 89 bpm. Predominant underlying rhythm was Sinus Rhythm. 7 Supraventricular Tachycardia runs occurred, the run with the fastest interval lasting 4 beats with a max rate of 182 bpm, the  longest lasting 10 beats with an avg rate of 132 bpm. Isolated SVEs were rare (<1.0%), SVE Couplets were rare (<1.0%), and SVE Triplets were rare (<1.0%). Isolated VEs were occasional (1.7%, 28450), VE Couplets were rare (<1.0%, 54), and no VE Triplets  were present. Ventricular Trigeminy was present.        Risk Assessment/Calculations:  Physical Exam:   VS:  BP 112/74   Pulse 84   Ht 5\' 8"  (1.727 m)   Wt 234 lb 3.2 oz (106.2 kg)   SpO2 98%   BMI 35.61 kg/m    Wt Readings from Last 3 Encounters:  04/28/24 234 lb 3.2 oz (106.2 kg)  02/17/24 230 lb (104.3 kg)  02/05/24 231 lb (104.8 kg)    GEN: Obese, in no acute distress NECK: No JVD; No carotid bruits CARDIAC: RRR, no murmurs, rubs, gallops RESPIRATORY:  Clear to auscultation without rales, wheezing or rhonchi  ABDOMEN: Soft, non-tender, non-distended EXTREMITIES:  No edema; No  deformity   ASSESSMENT AND PLAN: .    Chest pain normal Coronary CTA, echo normal LVEF, mild LVH  SVT  on Zio toprol  increased, still having some palpitations but drinking 64 ounces tea daily. Cut back on caffeine. If still having palpitations can increase toprol .    pulm nodule noted on CTA-has f/u with pulmonary because of history of renal CA  Obesity-exercise and weight loss discussed.        Dispo: f/u in 1 yr  Signed, Theotis Flake, PA-C

## 2024-04-28 ENCOUNTER — Ambulatory Visit: Attending: Physician Assistant | Admitting: Physician Assistant

## 2024-04-28 ENCOUNTER — Encounter: Payer: Self-pay | Admitting: Physician Assistant

## 2024-04-28 VITALS — BP 112/74 | HR 84 | Ht 68.0 in | Wt 234.2 lb

## 2024-04-28 DIAGNOSIS — E669 Obesity, unspecified: Secondary | ICD-10-CM

## 2024-04-28 DIAGNOSIS — R079 Chest pain, unspecified: Secondary | ICD-10-CM

## 2024-04-28 DIAGNOSIS — I471 Supraventricular tachycardia, unspecified: Secondary | ICD-10-CM

## 2024-04-28 DIAGNOSIS — R911 Solitary pulmonary nodule: Secondary | ICD-10-CM | POA: Diagnosis not present

## 2024-04-28 NOTE — Patient Instructions (Signed)
 Medication Instructions:  Your physician recommends that you continue on your current medications as directed. Please refer to the Current Medication list given to you today.  *If you need a refill on your cardiac medications before your next appointment, please call your pharmacy*  Lab Work: NONE If you have labs (blood work) drawn today and your tests are completely normal, you will receive your results only by: MyChart Message (if you have MyChart) OR A paper copy in the mail If you have any lab test that is abnormal or we need to change your treatment, we will call you to review the results.  Testing/Procedures: NONE  Follow-Up: At [facility], you and your health needs are our priority.  As part of our continuing mission to provide you with exceptional heart care, our providers are all part of one team.  This team includes your primary Cardiologist (physician) and Advanced Practice Providers or APPs (Physician Assistants and Nurse Practitioners) who all work together to provide you with the care you need, when you need it.  Your next appointment:   1 year(s)  Provider:   TURNER  We recommend signing up for the patient portal called "MyChart".  Sign up information is provided on this After Visit Summary.  MyChart is used to connect with patients for Virtual Visits (Telemedicine).  Patients are able to view lab/test results, encounter notes, upcoming appointments, etc.  Non-urgent messages can be sent to your provider as well.   To learn more about what you can do with MyChart, go to [email or web address].   Other Instructions Two Gram Sodium Diet 2000 mg  What is Sodium? Sodium is a mineral found naturally in many foods. The most significant source of sodium in the diet is table salt, which is about 40% sodium.  Processed, convenience, and preserved foods also contain a large amount of sodium.  The body needs only 500 mg of sodium daily to function,  A normal diet  provides more than enough sodium even if you do not use salt.  Why Limit Sodium? A build up of sodium in the body can cause thirst, increased blood pressure, shortness of breath, and water retention.  Decreasing sodium in the diet can reduce edema and risk of heart attack or stroke associated with high blood pressure.  Keep in mind that there are many other factors involved in these health problems.  Heredity, obesity, lack of exercise, cigarette smoking, stress and what you eat all play a role.  General Guidelines: Do not add salt at the table or in cooking.  One teaspoon of salt contains over 2 grams of sodium. Read food labels Avoid processed and convenience foods Ask your dietitian before eating any foods not dicussed in the menu planning guidelines Consult your physician if you wish to use a salt substitute or a sodium containing medication such as antacids.  Limit milk and milk products to 16 oz (2 cups) per day.  Shopping Hints: READ LABELS!! "Dietetic" does not necessarily mean low sodium. Salt and other sodium ingredients are often added to foods during processing.    Menu Planning Guidelines Food Group Choose More Often Avoid  Beverages (see also the milk group All fruit juices, low-sodium, salt-free vegetables juices, low-sodium carbonated beverages Regular vegetable or tomato juices, commercially softened water used for drinking or cooking  Breads and Cereals Enriched white, wheat, rye and pumpernickel bread, hard rolls and dinner rolls; muffins, cornbread and waffles; most dry cereals, cooked cereal without added salt; unsalted crackers and  breadsticks; low sodium or homemade bread crumbs Bread, rolls and crackers with salted tops; quick breads; instant hot cereals; pancakes; commercial bread stuffing; self-rising flower and biscuit mixes; regular bread crumbs or cracker crumbs  Desserts and Sweets Desserts and sweets mad with mild should be within allowance Instant pudding mixes  and cake mixes  Fats Butter or margarine; vegetable oils; unsalted salad dressings, regular salad dressings limited to 1 Tbs; light, sour and heavy cream Regular salad dressings containing bacon fat, bacon bits, and salt pork; snack dips made with instant soup mixes or processed cheese; salted nuts  Fruits Most fresh, frozen and canned fruits Fruits processed with salt or sodium-containing ingredient (some dried fruits are processed with sodium sulfites        Vegetables Fresh, frozen vegetables and low- sodium canned vegetables Regular canned vegetables, sauerkraut, pickled vegetables, and others prepared in brine; frozen vegetables in sauces; vegetables seasoned with ham, bacon or salt pork  Condiments, Sauces, Miscellaneous  Salt substitute with physician's approval; pepper, herbs, spices; vinegar, lemon or lime juice; hot pepper sauce; garlic powder, onion powder, low sodium soy sauce (1 Tbs.); low sodium condiments (ketchup, chili sauce, mustard) in limited amounts (1 tsp.) fresh ground horseradish; unsalted tortilla chips, pretzels, potato chips, popcorn, salsa (1/4 cup) Any seasoning made with salt including garlic salt, celery salt, onion salt, and seasoned salt; sea salt, rock salt, kosher salt; meat tenderizers; monosodium glutamate; mustard, regular soy sauce, barbecue, sauce, chili sauce, teriyaki sauce, steak sauce, Worcestershire sauce, and most flavored vinegars; canned gravy and mixes; regular condiments; salted snack foods, olives, picles, relish, horseradish sauce, catsup   Food preparation: Try these seasonings Meats:    Pork Sage, onion Serve with applesauce  Chicken Poultry seasoning, thyme, parsley Serve with cranberry sauce  Lamb Curry powder, rosemary, garlic, thyme Serve with mint sauce or jelly  Veal Marjoram, basil Serve with current jelly, cranberry sauce  Beef Pepper, bay leaf Serve with dry mustard, unsalted chive butter  Fish [place] leaf, dill Serve with unsalted lemon  butter, unsalted parsley butter  Vegetables:    Asparagus Lemon juice   Broccoli Lemon juice   Carrots Mustard dressing parsley, mint, nutmeg, glazed with unsalted butter and sugar   Green beans Marjoram, lemon juice, nutmeg,dill seed   Tomatoes Basil, marjoram, onion   Spice /blend for [employer]" 4 tsp ground thyme 1 tsp ground sage 3 tsp ground rosemary 4 tsp ground marjoram   Test your knowledge A product that says "Salt Free" may still contain sodium. True or False Garlic Powder and Hot Pepper Sauce an be used as alternative seasonings.True or False Processed foods have more sodium than fresh foods.  True or False Canned Vegetables have less sodium than froze True or False   WAYS TO DECREASE YOUR SODIUM INTAKE Avoid the use of added salt in cooking and at the table.  Table salt (and other prepared seasonings which contain salt) is probably one of the greatest sources of sodium in the diet.  Unsalted foods can gain flavor from the sweet, sour, and butter taste sensations of herbs and spices.  Instead of using salt for seasoning, try the following seasonings with the foods listed.  Remember: how you use them to enhance natural food flavors is limited only by your creativity... Allspice-Meat, fish, eggs, fruit, peas, red and yellow vegetables Almond Extract-Fruit baked goods Anise Seed-Sweet breads, fruit, carrots, beets, cottage cheese, cookies (tastes like licorice) Basil-Meat, fish, eggs, vegetables, rice, vegetables salads, soups, sauces Bay Leaf-Meat, fish,  stews, poultry Burnet-Salad, vegetables (cucumber-like flavor) Caraway Seed-Bread, cookies, cottage cheese, meat, vegetables, cheese, rice Cardamon-Baked goods, fruit, soups Celery Powder or seed-Salads, salad dressings, sauces, meatloaf, soup, bread.Do not use  celery salt Chervil-Meats, salads, fish, eggs, vegetables, cottage cheese (parsley-like flavor) Chili Power-Meatloaf, chicken cheese, corn, eggplant, egg  dishes Chives-Salads cottage cheese, egg dishes, soups, vegetables, sauces Cilantro-Salsa, casseroles Cinnamon-Baked goods, fruit, pork, lamb, chicken, carrots Cloves-Fruit, baked goods, fish, pot roast, green beans, beets, carrots Coriander-Pastry, cookies, meat, salads, cheese (lemon-orange flavor) Cumin-Meatloaf, fish,cheese, eggs, cabbage,fruit pie (caraway flavor) [employer], fruit, eggs, fish, poultry, cottage cheese, vegetables Dill Seed-Meat, cottage cheese, poultry, vegetables, fish, salads, bread Fennel Seed-Bread, cookies, apples, pork, eggs, fish, beets, cabbage, cheese, Licorice-like flavor Garlic-(buds or powder) Salads, meat, poultry, fish, bread, butter, vegetables, potatoes.Do not  use garlic salt Ginger-Fruit, vegetables, baked goods, meat, fish, poultry Horseradish Root-Meet, vegetables, butter Lemon Juice or Extract-Vegetables, fruit, tea, baked goods, fish salads Mace-Baked goods fruit, vegetables, fish, poultry (taste like nutmeg) Maple Extract-Syrups Marjoram-Meat, chicken, fish, vegetables, breads, green salads (taste like Sage) Mint-Tea, lamb, sherbet, vegetables, desserts, carrots, cabbage Mustard, Dry or Seed-Cheese, eggs, meats, vegetables, poultry Nutmeg-Baked goods, fruit, chicken, eggs, vegetables, desserts Onion Powder-Meat, fish, poultry, vegetables, cheese, eggs, bread, rice salads (Do not use   Onion salt) Orange Extract-Desserts, baked goods Oregano-Pasta, eggs, cheese, onions, pork, lamb, fish, chicken, vegetables, green salads Paprika-Meat, fish, poultry, eggs, cheese, vegetables Parsley Flakes-Butter, vegetables, meat fish, poultry, eggs, bread, salads (certain forms may   Contain sodium Pepper-Meat fish, poultry, vegetables, eggs Peppermint Extract-Desserts, baked goods Poppy Seed-Eggs, bread, cheese, fruit dressings, baked goods, noodles, vegetables, cottage  [employer], poultry, meat, fish,  cauliflower, turnips,eggs bread Saffron-Rice, bread, veal, chicken, fish, eggs Sage-Meat, fish, poultry, onions, eggplant, tomateos, pork, stews Savory-Eggs, salads, poultry, meat, rice, vegetables, soups, pork Tarragon-Meat, poultry, fish, eggs, butter, vegetables (licorice-like flavor)  Thyme-Meat, poultry, fish, eggs, vegetables, (clover-like flavor), sauces, soups Tumeric-Salads, butter, eggs, fish, rice, vegetables (saffron-like flavor) Vanilla Extract-Baked goods, candy Vinegar-Salads, vegetables, meat marinades Walnut Extract-baked goods, candy   2. Choose your Foods Wisely   The following is a list of foods to avoid which are high in sodium:  Meats-Avoid all smoked, canned, salt cured, dried and kosher meat and fish as well as Anchovies   [employer] meats:Bologna, Liverwurst, Pastrami Canned meat or fish  Marinated herring Caviar    Pepperoni Corned Beef   Pizza Dried chipped beef  Salami Frozen breaded fish or meat Salt pork Frankfurters or hot dogs  Sardines Gefilte fish   Sausage Ham (boiled ham, Proscuitto Smoked butt    spiced ham)   Spam      TV Dinners Vegetables Canned vegetables (Regular) Relish Canned mushrooms  Sauerkraut Olives    Tomato juice Pickles  Bakery and Dessert Products Canned puddings  Cream pies Cheesecake   Decorated cakes Cookies  Beverages/Juices Tomato juice, regular  Gatorade   V-8 vegetable juice, regular  Breads and Cereals Biscuit mixes   Salted potato chips, corn chips, pretzels Bread stuffing mixes  Salted crackers and rolls Pancake and waffle mixes Self-rising flour  Seasonings Accent    Meat sauces Barbecue sauce  Meat tenderizer Catsup    Monosodium glutamate (MSG) Celery salt   Onion salt Chili sauce   Prepared mustard Garlic salt   Salt, seasoned salt, sea salt Gravy mixes   Soy sauce Horseradish   Steak sauce Ketchup   Tartar sauce Lite salt    Teriyaki sauce Marinade mixes  Worcestershire  sauce  Others Baking powder   Cocoa and cocoa mixes Baking soda   Commercial casserole mixes Candy-caramels, chocolate  Dehydrated soups    Bars, fudge,nougats  Instant rice and pasta mixes Canned broth or soup  Maraschino cherries Cheese, aged and processed cheese and cheese spreads  Learning Assessment Quiz  Indicated T (for True) or F (for False) for each of the following statements:  _____ Fresh fruits and vegetables and unprocessed grains are generally low in sodium _____ Water may contain a considerable amount of sodium, depending on the source _____ You can always tell if a food is high in sodium by tasting it _____ Certain laxatives my be high in sodium and should be avoided unless prescribed   by a physician or pharmacist _____ Salt substitutes may be used freely by anyone on a sodium restricted diet _____ Sodium is present in table salt, food additives and as a natural component of   most foods _____ Table salt is approximately 90% sodium _____ Limiting sodium intake may help prevent excess fluid accumulation in the body _____ On a sodium-restricted diet, seasonings such as bouillon soy sauce, and    cooking wine should be used in place of table salt _____ On an ingredient list, a product which lists monosodium glutamate as the first   ingredient is an appropriate food to include on a low sodium diet  Circle the best answer(s) to the following statements (Hint: there may be more than one correct answer)  11. On a low-sodium diet, some acceptable snack items are:    A. Olives  F. Bean dip   K. Grapefruit juice    B. Salted Pretzels G. Commercial Popcorn   L. Canned peaches    C. Carrot Sticks  H. Bouillon   M. Unsalted nuts   D. [place] fries  I. Peanut butter crackers N. Salami   E. Sweet pickles J. Tomato Juice   O. Pizza  12.  Seasonings that may be used freely on a reduced - sodium diet include   A. Lemon wedges F.Monosodium glutamate K. Celery  seed    B.Soysauce   G. Pepper   L. Mustard powder   C. Sea salt  H. Cooking wine  M. Onion flakes   D. Vinegar  E. Prepared horseradish N. Salsa   E. Sage   J. Worcestershire sauce  O. Chutney

## 2024-09-04 ENCOUNTER — Ambulatory Visit (HOSPITAL_COMMUNITY)
Admission: RE | Admit: 2024-09-04 | Discharge: 2024-09-04 | Disposition: A | Discharge status: Home / Self Care | Source: Ambulatory Visit | Attending: Urology | Admitting: Urology

## 2024-09-04 ENCOUNTER — Other Ambulatory Visit (HOSPITAL_COMMUNITY): Payer: Self-pay | Admitting: Urology

## 2024-09-04 DIAGNOSIS — C641 Malignant neoplasm of right kidney, except renal pelvis: Secondary | ICD-10-CM | POA: Diagnosis present

## 2024-09-21 ENCOUNTER — Other Ambulatory Visit: Payer: Self-pay | Admitting: Cardiology
